# Patient Record
Sex: Female | Born: 1960 | Hispanic: Yes | Marital: Single | Smoking: Never smoker
Health system: Southern US, Community
[De-identification: ages and names within clinical notes are randomized; demographics above are authoritative.]

## PROBLEM LIST (undated history)

## (undated) ENCOUNTER — Emergency Department (HOSPITAL_COMMUNITY): Admission: EM | Discharge status: Absent without leave | Payer: Self-pay

---

## 2003-04-25 ENCOUNTER — Other Ambulatory Visit: Payer: Self-pay

## 2004-03-19 ENCOUNTER — Inpatient Hospital Stay: Payer: Self-pay | Admitting: Internal Medicine

## 2004-03-26 ENCOUNTER — Emergency Department: Payer: Self-pay | Admitting: Emergency Medicine

## 2005-08-05 ENCOUNTER — Ambulatory Visit: Payer: Self-pay | Admitting: Family Medicine

## 2006-05-07 ENCOUNTER — Ambulatory Visit: Payer: Self-pay

## 2007-12-21 ENCOUNTER — Ambulatory Visit: Payer: Self-pay | Admitting: Family Medicine

## 2007-12-28 ENCOUNTER — Ambulatory Visit: Payer: Self-pay | Admitting: Family Medicine

## 2011-02-01 ENCOUNTER — Emergency Department (HOSPITAL_COMMUNITY): Payer: Worker's Compensation

## 2011-02-01 ENCOUNTER — Emergency Department (HOSPITAL_COMMUNITY)
Admission: EM | Admit: 2011-02-01 | Discharge: 2011-02-01 | Disposition: A | Discharge status: Home / Self Care | Payer: Worker's Compensation | Attending: Emergency Medicine | Admitting: Emergency Medicine

## 2011-02-01 ENCOUNTER — Encounter: Payer: Self-pay | Admitting: *Deleted

## 2011-02-01 DIAGNOSIS — Y9269 Other specified industrial and construction area as the place of occurrence of the external cause: Secondary | ICD-10-CM | POA: Insufficient documentation

## 2011-02-01 DIAGNOSIS — T07XXXA Unspecified multiple injuries, initial encounter: Secondary | ICD-10-CM

## 2011-02-01 DIAGNOSIS — M25519 Pain in unspecified shoulder: Secondary | ICD-10-CM | POA: Insufficient documentation

## 2011-02-01 DIAGNOSIS — M549 Dorsalgia, unspecified: Secondary | ICD-10-CM | POA: Insufficient documentation

## 2011-02-01 DIAGNOSIS — W010XXA Fall on same level from slipping, tripping and stumbling without subsequent striking against object, initial encounter: Secondary | ICD-10-CM | POA: Insufficient documentation

## 2011-02-01 DIAGNOSIS — W19XXXA Unspecified fall, initial encounter: Secondary | ICD-10-CM

## 2011-02-01 DIAGNOSIS — S20229A Contusion of unspecified back wall of thorax, initial encounter: Secondary | ICD-10-CM | POA: Insufficient documentation

## 2011-02-01 DIAGNOSIS — S40019A Contusion of unspecified shoulder, initial encounter: Secondary | ICD-10-CM | POA: Insufficient documentation

## 2011-02-01 MED ORDER — METHOCARBAMOL 500 MG PO TABS: 500.0000 mg | ORAL_TABLET | Freq: Three times a day (TID) | ORAL | Status: AC

## 2011-02-01 MED ORDER — HYDROCODONE-ACETAMINOPHEN 5-325 MG PO TABS: ORAL_TABLET | ORAL | Status: AC

## 2011-02-01 MED ORDER — NAPROXEN 500 MG PO TABS: 500.0000 mg | ORAL_TABLET | Freq: Two times a day (BID) | ORAL | Status: DC

## 2011-02-01 NOTE — ED Provider Notes (Signed)
History     CSN: [id number] Arrival date & time: 02/01/2011  4:21 PM   First MD Initiated Contact with Patient 02/01/11 1624      Chief Complaint  Patient presents with  . Fall    (Consider location/radiation/quality/duration/timing/severity/associated sxs/prior treatment) HPI Comments: History was obtained through [employer].  Patient c/o pain to her right shoulder,and  right lower back.  States she slipped on a wet floor at work and landed on her right side.  She denies LOC, head injury , headaches , visual changes or neck pain.  Pain is worse with walking and certain movements and improves with rest.    Patient is a 50 y.o. female presenting with fall. The history is provided by the patient.  Fall The accident occurred 2 days ago. The fall occurred while walking. She fell from a height of 3 to 5 ft. She landed on a hard floor. There was no blood loss. Point of impact: right lower back and right shoulder. Pain location: right middle and lower back and right shoulder. The pain is moderate. She was ambulatory at the scene. There was no entrapment after the fall. There was no drug use involved in the accident. There was no alcohol use involved in the accident. Pertinent negatives include no visual change, no fever, no numbness, no abdominal pain, no bowel incontinence, no nausea, no vomiting, no hematuria, no headaches, no loss of consciousness and no tingling. The symptoms are aggravated by activity, extension, rotation and ambulation. She has tried nothing for the symptoms. The treatment provided no relief.    History reviewed. No pertinent past medical history.  History reviewed. No pertinent past surgical history.  History reviewed. No pertinent family history.  History  Substance Use Topics  . Smoking status: Never Smoker   . Smokeless tobacco: Not on file  . Alcohol Use: No    OB History    Grav Para Term Preterm Abortions TAB SAB Ect Mult Living                   Review of Systems  Constitutional: Negative for fever, chills, activity change, appetite change and fatigue.  HENT: Negative for sore throat, facial swelling, trouble swallowing, neck pain and neck stiffness.   Eyes: Negative for visual disturbance.  Respiratory: Positive for wheezing. Negative for cough, chest tightness and shortness of breath.   Cardiovascular: Negative for chest pain and palpitations.  Gastrointestinal: Negative for nausea, vomiting, abdominal pain and bowel incontinence.  Genitourinary: Negative for dysuria, hematuria, flank pain and difficulty urinating.  Musculoskeletal: Positive for back pain and arthralgias. Negative for myalgias, joint swelling and gait problem.  Skin: Negative.  Negative for rash and wound.  Neurological: Negative for dizziness, tingling, loss of consciousness, facial asymmetry, weakness, numbness and headaches.  Hematological: Does not bruise/bleed easily.  All other systems reviewed and are negative.    Allergies  Review of patient's allergies indicates no known allergies.  Home Medications  No current outpatient prescriptions on file.  BP 120/65  Pulse 88  Temp(Src) 98.4 F (36.9 C) (Oral)  Resp 20  SpO2 97%  Physical Exam  Nursing note and vitals reviewed. Constitutional: She is oriented to person, place, and time. She appears well-developed and well-nourished. No distress.  HENT:  Head: Normocephalic and atraumatic.  Mouth/Throat: Oropharynx is clear and moist.  Eyes: EOM are normal. Pupils are equal, round, and reactive to light.  Neck: Normal range of motion. Neck supple. No spinous process tenderness and no muscular  tenderness present. No rigidity. Normal range of motion present.  Cardiovascular: Normal rate, regular rhythm and normal heart sounds.   Pulmonary/Chest: Effort normal and breath sounds normal. No respiratory distress. She exhibits no tenderness.  Abdominal: Soft. She exhibits no distension and no mass. There  is no tenderness. There is no rebound and no guarding.  Musculoskeletal: Normal range of motion. She exhibits tenderness. She exhibits no edema.       Right shoulder: She exhibits tenderness and bony tenderness. She exhibits normal range of motion, no swelling, no effusion, no crepitus, no deformity, no laceration, no spasm, normal pulse and normal strength.       Back:       Arms: Lymphadenopathy:    She has no cervical adenopathy.  Neurological: She is alert and oriented to person, place, and time. No cranial nerve deficit or sensory deficit. She exhibits normal muscle tone. Coordination and gait normal.  Reflex Scores:      Tricep reflexes are 2+ on the right side and 2+ on the left side.      Bicep reflexes are 2+ on the right side and 2+ on the left side.      Patellar reflexes are 2+ on the right side and 2+ on the left side.      Achilles reflexes are 2+ on the right side and 2+ on the left side. Skin: Skin is warm and dry.  Psychiatric: She has a normal mood and affect.    ED Course  Procedures (including critical care time)  Labs Reviewed - No data to display Dg Ribs Unilateral W/chest Right  02/01/2011  *RADIOLOGY REPORT*  Clinical Data: Fall with right rib pain.  RIGHT RIBS AND CHEST - 3+ VIEW  Comparison: None.  Findings: Trachea is midline.  Heart size normal.  Lungs are clear. No pleural fluid.  No pneumothorax.  Dedicated views of the right ribs show no definite acute fracture.  IMPRESSION: No acute findings.  Original Report Authenticated By: Reyes Ivan, M.D.   Dg Lumbar Spine Complete  02/01/2011  *RADIOLOGY REPORT*  Clinical Data: Fall with low back pain.  LUMBAR SPINE - COMPLETE 4+ VIEW  Comparison: None.  Findings: Alignment is anatomic.  Vertebral body height is maintained.  No pars defects.  Mild endplate degenerative changes are seen in the lower lumbar spine, with loss of disc space height and facet hypertrophy at L5-S1.  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  No acute findings.  Original Report Authenticated By: Reyes Ivan, M.D.   Dg Shoulder Right  02/01/2011  *RADIOLOGY REPORT*  Clinical Data: Fall with right shoulder pain.  RIGHT SHOULDER - 2+ VIEW  Comparison: None.  Findings: No fracture or dislocation.  Mild degenerative changes are seen in the right acromioclavicular joint.  There is an age indeterminate nondisplaced fracture involving the right 8th posterolateral rib.  Visualized portion of the right chest is unremarkable.  IMPRESSION:  1.  No fracture or dislocation involving the right shoulder. 2. Age indeterminate nondisplaced fracture of the right 8th posterolateral rib.  Original Report Authenticated By: Reyes Ivan, M.D.        MDM      6:47 PM patient is sitting on the stretcher, NAD.  Vitals stable.  Ambulates w/o difficulty, no focal neuro deficits.  ttp of the right lumbar and thoracic paraspinal muscles, no brusing or edema.  Pt has full ROM of all extremites. Will prescribes pain meds, NSAIDs and pt agrees to f/u with her PMD.  Patient / Family / Caregiver understand and agree with initial ED impression and plan with expectations set for ED visit.    Tanajah Boulter L. Shania Bjelland, [place] 02/02/11 0105

## 2011-02-01 NOTE — ED Notes (Signed)
Pt c/o fall x 2 days ago; pt c/o pain to her right side and right lower back;

## 2011-02-01 NOTE — ED Notes (Signed)
Patient with no complaints at this time. Respirations even and unlabored. Skin warm/dry. Discharge instructions reviewed with patient at this time. Patient given opportunity to voice concerns/ask questions. Patient discharged at this time and left Emergency Department with steady gait.   

## 2011-02-01 NOTE — Discharge Instructions (Signed)
Contusin (Contusion) Furniture conservator/restorer (contusin) es una acumulacin de sangre debajo de la piel que produce un cambio en la coloracin. Se origina en una lesin de los vasos sanguneos que se encuentran debajo de la zona [place], que produce una liberacin de [place]. A esta acumulacin de sangre se la conoce como hematoma. Produce una coloracin entre [place] y [place] y, a medida que la lesin se Sport and exercise psychologist (mejora) luego de unos [address]  [address] o [place], se torna de un color amarillento y luego generalmente desaparece completamente en el mismo lapso. Generalmente se cura completamente sin dejar secuelas. El hematoma rara vez requiere drenaje. INSTRUCCIONES PARA EL CUIDADO DOMICILIARIO  Aplique hielo sobre la lesin durante 15 a 20 minutos 3 a 4 veces por da durante uno o [address].   Ponga el hielo en una bolsa plstica y coloque una toalla entre la bolsa y la piel. Suspenda el uso de hielo si Art gallery manager.   Si el sangrado es mnimo, aplique presin en el rea durante al menos treinta minutos para disminuir el hematoma o aplique presin y hielo como le indic el profesional que lo asiste.   Si la herida se produjo en una extremidad, eleve esa zona para disminuir el dolor y la hinchazn. Envolverlo con un vendaje ACE o un vendaje de sostn puede beneficiarlo. Si el hematoma se produjo en una extremidad inferior y es dolorosa, puede aliviarlo el uso de muletas durante [address]   Si le han aplicado la vacuna contra el ttanos porque hubo lesin de piel, el brazo podr hincharse, enrojecer o subir la temperatura en el lugar de la inyeccin. Esta es una respuesta normal a Industrial/product designer. Si usted no ha recibido Publishing copy ttano porque no recuerda cuando recibi la ltima, asegrese de controlarse con el profesional que lo asiste para determinar si la necesita. Generalmente para una herida "sucia" deber aplicarse una dosis de refuerzo si no se la ha [employer] ltimos cinco aos.  Generalmente para una herida "limpia" deber aplicarse una dosis de refuerzo si no se la ha [employer] ltimos [employer]  SOLICITE ATENCIN MDICA SI:  Siente dolor que no Magazine features editor con los medicamentos de [employer]. Utilice los medicamentos de venta libre o de prescripcin para Chief Technology Officer, Environmental health practitioner o la [place], segn se lo indique el profesional que lo asiste. No utilice aspirinas, ya que Building surveyor.   Aparece un dolor cada vez ms intenso o hinchazn en la zona de la lesin.   Aparece algn problema que parece ser peor que el trastorno que lo trajo a la [place].  SOLICITE ATENCIN MDICA DE INMEDIATO SI:  Tiene fiebre.   Comienza a sentir dolor intenso en la zona del hematoma que no se corresponde con la lesin inicial.   La zona del hematoma se vuelve roja, se hincha o duele.  EST SEGURO QUE:   Comprende las instrucciones para el alta mdica.   Controlar su enfermedad.   Solicitar atencin mdica de inmediato segn las indicaciones.  Document Released: 12/05/2004 Document Revised: 11/07/2010 [facility] Patient Information 2012 [place], [place].Contusion A contusion is a deep bruise. Bruises happen when an injury causes bleeding under the skin. Signs of bruising include pain, puffiness (swelling), and discolored skin. The bruise may turn blue, purple, or yellow. HOME CARE   Rest the injured area until the pain and puffiness are better.   Try to limit use of the injured area as much as possible or as told by  your doctor.   Put ice on the injured area.   Put ice in a plastic bag.   Place a towel between your skin and the bag.   Leave the ice on for 15 to 20 minutes, 3 to 4 times a day.   Raise (elevate) the injured area above the level of the heart.   Use an elastic bandage to lessen puffiness and motion.   Only take medicine as told by your doctor.   Eat healthy.   See your doctor for a follow-up visit.  GET HELP RIGHT AWAY IF:    There is more redness, puffiness, or pain.   You have a headache, muscle ache, or you feel dizzy and ill.   You have a fever.   The pain is not controlled with medicine.   The bruise is not getting better.   There is yellowish white fluid (pus) coming from the wound.   You lose feeling (numbness) in the injured area.   The bruised area feels cold.   There are new problems.  MAKE SURE YOU:   Understand these instructions.   Will watch your condition.   Will get help right away if you are not doing well or get worse.  Document Released: 08/14/2007 Document Revised: 11/07/2010 Document Reviewed: 08/14/2007 [facility] Patient Information 2012 [place], [place].

## 2011-02-02 NOTE — ED Provider Notes (Signed)
Medical screening examination/treatment/procedure(s) were performed by non-physician practitioner and as supervising physician I was immediately available for consultation/collaboration.   Laray Anger, DO 02/02/11 2217

## 2011-10-07 ENCOUNTER — Emergency Department (HOSPITAL_COMMUNITY): Payer: Self-pay

## 2011-10-07 ENCOUNTER — Emergency Department (HOSPITAL_COMMUNITY)
Admission: EM | Admit: 2011-10-07 | Discharge: 2011-10-07 | Disposition: A | Discharge status: Home / Self Care | Payer: Self-pay | Attending: Emergency Medicine | Admitting: Emergency Medicine

## 2011-10-07 ENCOUNTER — Encounter (HOSPITAL_COMMUNITY): Payer: Self-pay | Admitting: *Deleted

## 2011-10-07 DIAGNOSIS — M549 Dorsalgia, unspecified: Secondary | ICD-10-CM | POA: Insufficient documentation

## 2011-10-07 DIAGNOSIS — R109 Unspecified abdominal pain: Secondary | ICD-10-CM | POA: Insufficient documentation

## 2011-10-07 DIAGNOSIS — M65839 Other synovitis and tenosynovitis, unspecified forearm: Secondary | ICD-10-CM | POA: Insufficient documentation

## 2011-10-07 DIAGNOSIS — M778 Other enthesopathies, not elsewhere classified: Secondary | ICD-10-CM

## 2011-10-07 MED ORDER — OXYCODONE-ACETAMINOPHEN 5-325 MG PO TABS: 1.0000 | ORAL_TABLET | Freq: Four times a day (QID) | ORAL | Status: AC | PRN

## 2011-10-07 MED ORDER — IBUPROFEN 600 MG PO TABS: 600.0000 mg | ORAL_TABLET | Freq: Four times a day (QID) | ORAL | Status: AC | PRN

## 2011-10-07 NOTE — ED Notes (Signed)
Return from xray

## 2011-10-07 NOTE — ED Provider Notes (Signed)
History  This chart was scribed for [employer]. Rubin Payor, MD by Bennett Scrape. This patient was seen in room APA18/APA18 and the patient's care was started at 11:10AM.  CSN: [id number]  Arrival date & time 10/07/11  1029   First MD Initiated Contact with Patient 10/07/11 1110      Chief Complaint  Patient presents with  . Flank Pain  . Back Pain    The history is provided by the patient. A language interpreter was used.    [place] Janice Wells is a 51 y.o. female who presents to the Emergency Department complaining of one week of gradual onset, gradually worsening, constant mid back pain that radiates into the left anterior ribs that is worse with movement. She reports experiencing a similar pain 2 years ago after a fall. She denies any recent falls. She denies taking OTC medications at home to improve symptoms. She also c/o several weeks of gradually worsening, constant right wrist pain localized mainly in the thumb that she attributes to a plate that she uses at work. The pain is worse with movement and use of the right hand. She denies fever, nausea, emesis, urinary symptoms, numbness and weakness as associated symptoms. She does not have a h/o chronic medical conditions. She denies smoking and alcohol use.   History reviewed. No pertinent past medical history.  History reviewed. No pertinent past surgical history.  No family history on file.  History  Substance Use Topics  . Smoking status: Never Smoker   . Smokeless tobacco: Not on file  . Alcohol Use: No    No OB history provided.  Review of Systems  Constitutional: Negative for appetite change.  Cardiovascular: Positive for chest pain (left rib pain). Negative for leg swelling.  Gastrointestinal: Positive for abdominal pain. Negative for nausea, vomiting and diarrhea.  Genitourinary: Negative for dysuria, hematuria and flank pain.  Musculoskeletal: Positive for back pain (mid).  Neurological: Negative for  weakness and numbness.    Allergies  Review of patient's allergies indicates no known allergies.  Home Medications   Current Outpatient Rx  Name Route Sig Dispense Refill  . IBUPROFEN 600 MG PO TABS Oral Take 1 tablet (600 mg total) by mouth every 6 (six) hours as needed for pain. 20 tablet 0  . OXYCODONE-ACETAMINOPHEN 5-325 MG PO TABS Oral Take 1-2 tablets by mouth every 6 (six) hours as needed for pain. 10 tablet 0    Triage Vitals: BP 131/68  Pulse 83  Temp 98.5 F (36.9 C) (Oral)  Resp 16  Ht 5\' 1"  (1.549 m)  Wt 150 lb (68.04 kg)  BMI 28.34 kg/m2  SpO2 99%  Physical Exam  Nursing note and vitals reviewed. Constitutional: She is oriented to person, place, and time. She appears well-developed and well-nourished. No distress.  HENT:  Head: Normocephalic and atraumatic.  Eyes: EOM are normal.  Neck: Neck supple. No tracheal deviation present.  Cardiovascular: Normal rate and regular rhythm.   Pulmonary/Chest: Effort normal and breath sounds normal. No respiratory distress. She exhibits tenderness (tender over the left anterior ribs).  Abdominal: Soft. There is no tenderness.  Musculoskeletal: Normal range of motion. She exhibits tenderness. She exhibits no edema.       Tender over the right wrist laterally with tenderness to opposition of the thumb, no snuff box tenderness, tender along the lower thoracic spine  Neurological: She is alert and oriented to person, place, and time.  Skin: Skin is warm and dry.  Psychiatric: She has a normal mood and  affect. Her behavior is normal.    ED Course  Procedures (including critical care time)  COORDINATION OF CARE: 11:32AM-Through interpreter, discussed treatment plan which includes x-rays, pain medications and a splint with pt at bedside and pt agreed to plan. 12:49PM-Informed pt of radiology results. Discussed discharge plan of splint and pain medications with pt and pt agreed.   Labs Reviewed - No data to display Dg Ribs  Unilateral W/chest Left  10/07/2011  *RADIOLOGY REPORT*  Clinical Data: Flank pain, back pain, left rib pain.  LEFT RIBS AND CHEST - 3+ VIEW  Comparison: 02/01/2011  Findings: Minimal bibasilar atelectasis.  Heart is normal size.  No effusions.  No acute bony abnormality.  No evidence of left rib fracture or rib lesion.  No pneumothorax.  IMPRESSION: Bibasilar atelectasis.  Original Report Authenticated By: Cyndie Chime, M.D.   Dg Wrist Complete Right  10/07/2011  *RADIOLOGY REPORT*  Clinical Data: Right wrist pain  RIGHT WRIST - COMPLETE 3+ VIEW  Comparison: None.  Findings: No fracture or dislocation is seen.  The joint spaces are essentially preserved.  The visualized soft tissues are unremarkable.  IMPRESSION: No fracture or dislocation is seen.  Original Report Authenticated By: Charline Bills, M.D.     1. Back pain   2. Tendonitis of wrist, right       MDM  Patient with left back pain worse with movement. She has had episodes of this since a fall 2 years ago. No rash. No dysuria. Is worse with movement or palpation. Negative rib x-ray. Doubt urinary pathology. Also right wrist done due to tenderness. Negative x-ray. Patient will followup with Dr. Hilda Lias, with whom I discussed the case  I personally performed the services described in this documentation, which was scribed in my presence. The recorded information has been reviewed and considered.          Juliet Rude. Rubin Payor, MD 10/07/11 1322

## 2011-10-07 NOTE — ED Notes (Signed)
Pt states mid back pain and left flank pain began 1 weeks ago. NAD.

## 2014-04-27 ENCOUNTER — Other Ambulatory Visit (HOSPITAL_COMMUNITY): Payer: Self-pay | Admitting: Physician Assistant

## 2014-04-27 DIAGNOSIS — Z1231 Encounter for screening mammogram for malignant neoplasm of breast: Secondary | ICD-10-CM

## 2014-05-11 ENCOUNTER — Ambulatory Visit (HOSPITAL_COMMUNITY)
Admission: RE | Admit: 2014-05-11 | Discharge: 2014-05-11 | Disposition: A | Discharge status: Home / Self Care | Payer: Self-pay | Source: Ambulatory Visit | Attending: Physician Assistant | Admitting: Physician Assistant

## 2014-05-11 DIAGNOSIS — Z1231 Encounter for screening mammogram for malignant neoplasm of breast: Secondary | ICD-10-CM

## 2014-12-13 ENCOUNTER — Ambulatory Visit: Payer: Self-pay | Admitting: Physician Assistant

## 2014-12-13 ENCOUNTER — Encounter: Payer: Self-pay | Admitting: Physician Assistant

## 2014-12-13 VITALS — BP 124/70 | HR 77 | Temp 97.7°F | Ht <= 58 in | Wt 151.0 lb

## 2014-12-13 DIAGNOSIS — M25561 Pain in right knee: Secondary | ICD-10-CM

## 2014-12-13 NOTE — Progress Notes (Signed)
   Subjective:    Patient ID: Janice Wells, female    DOB: 1960-09-03, 54 y.o.   MRN: [id number]  HPI  Knee Pain  Pt slipped in the bathroom and hit her R knee on the bathtub on Saturday 11/29/14. Pt fell again on Saturday prior to today and scraped her R shin    Review of Systems  Constitutional: Negative for activity change.  Musculoskeletal: Negative for joint swelling and gait problem.  Skin: Positive for wound. Negative for color change.  Neurological: Negative for weakness and numbness.       Objective:   Physical Exam  Constitutional: She is oriented to person, place, and time. She appears well-developed and well-nourished.  Pulmonary/Chest: Effort normal.  Musculoskeletal: Normal range of motion. She exhibits no edema or tenderness.       Right knee: She exhibits normal range of motion, no swelling, no effusion, no ecchymosis, no deformity, no erythema, normal alignment, no LCL laxity and normal patellar mobility. No tenderness found.  Neurological: She is alert and oriented to person, place, and time.  Skin: Skin is warm and dry. Abrasion (R shin- healing well without s/s infection) noted.  Vitals reviewed.         Assessment & Plan:   Knee pain, acute, right  rec ice, elevate. Can wear ace wrap or knee sleeve if hurting. Avoid overexertion for several days until completely well. ibu or apap prn

## 2015-01-03 ENCOUNTER — Ambulatory Visit: Payer: Self-pay | Admitting: Physician Assistant

## 2015-01-03 ENCOUNTER — Encounter: Payer: Self-pay | Admitting: Physician Assistant

## 2015-01-03 VITALS — BP 138/72 | HR 69 | Temp 97.2°F | Ht 58.75 in | Wt 152.2 lb

## 2015-01-03 DIAGNOSIS — R1030 Lower abdominal pain, unspecified: Secondary | ICD-10-CM

## 2015-01-03 DIAGNOSIS — R1031 Right lower quadrant pain: Secondary | ICD-10-CM

## 2015-01-03 LAB — POCT URINALYSIS DIPSTICK
Bilirubin, UA: NEGATIVE
Glucose, UA: NEGATIVE
Ketones, UA: NEGATIVE
Leukocytes, UA: NEGATIVE
Nitrite, UA: NEGATIVE
PROTEIN UA: NEGATIVE
RBC UA: NEGATIVE
SPEC GRAV UA: 1.01
UROBILINOGEN UA: 0.2
pH, UA: 5

## 2015-01-03 NOTE — Patient Instructions (Signed)
Dieta rica en fibra  (High-Fiber Diet)  La fibra, también llamada fibra dietaria, es un tipo de carbohidrato que se encuentra en las frutas, las verduras, los cereales integrales y los frijoles. Una dieta rica en fibra puede tener muchos beneficios para la salud. El médico puede recomendar una dieta rica en fibra para ayudar a:  · Evitar el estreñimiento. La fibra puede hacer que defeque con más frecuencia.  · Disminuir el nivel de colesterol.  · Aliviar las hemorroides, la diverticulosis no complicada o el síndrome del intestino irritable.  · Evitar comer en exceso como parte de un plan para bajar de peso.  · Evitar cardiopatías, la diabetes tipo 2 y ciertos cánceres.  ¿EN QUÉ CONSISTE EL PLAN?  El consumo diario recomendado de fibra incluye lo siguiente:  · 38 gramos para hombres menores de 50 años.  · 30 gramos para hombres mayores de 50 años.  · 25 gramos para mujeres menores de 50 años.  · 21 gramos para mujeres mayores de 50 años.  Puede lograr el consumo diario recomendado de fibra si come una variedad de frutas, verduras, cereales y frijoles. El médico también puede recomendar un suplemento de fibra si no es posible obtener suficiente fibra a través de la dieta.  ¿QUÉ DEBO SABER ACERCA DE LA DIETA RICA EN FIBRA?  · La eficacia de los suplementos de fibra no ha sido estudiada ampliamente, de modo que es mejor obtener fibra a través de los alimentos.  · Verifique siempre el contenido de fibra en la etiqueta de información nutricional de los alimentos preenvasados. Busque alimentos que contengan al menos 5 gramos de fibra por porción.  · Consulte al nutricionista si tiene preguntas sobre algunos alimentos específicos relacionados con su enfermedad, especialmente si estos alimentos no se mencionan a continuación.  · Aumente el consumo diario de fibra en forma gradual. Aumentar demasiado rápido el consumo de fibra dietaria puede provocar meteorismo, cólicos o gases.  · Beber abundante agua. El agua ayuda a  digerir la fibra.  ¿QUÉ ALIMENTOS PUEDO COMER?  Cereales  Panes integrales. Multicereales. Avena. Arroz integral. Cebada. Trigo burgol. Mijo. Muffins de salvado. Palomitas de maíz. Galletas de centeno.  Verduras  Batatas. Espinaca. Col rizada. Alcachofas. Repollo. Brócoli. Guisantes. Zanahorias. [place].  Frutas  Frutos rojos. Peras. Manzanas. Naranjas [place]. Ciruelas y pasas. Higos secos.  Carnes y otras fuentes de proteínas  Frijoles blancos, colorados, pintos y porotos de soja. Guisantes secos. Lentejas. Frutos secos y semillas.  Lácteos  Yogur fortificado con fibra.  Bebidas  Leche de soja fortificada con fibra. Jugo de naranja fortificado con fibra.  Otros  Barras de fibra.  Los artículos mencionados arriba pueden no ser una lista completa de las bebidas o los alimentos recomendados. Comuníquese con el nutricionista para conocer más opciones.  ¿QUÉ ALIMENTOS NO SE RECOMIENDAN?  Cereales  Pan blanco. Pastas hechas con harina refinada. Arroz blanco.  Verduras  Papas fritas. Verduras enlatadas. Verduras bien cocidas.   Frutas  Jugo de frutas. Frutas cocidas coladas.  Carnes y otras fuentes de proteínas  Cortes de carne con grasa. Aves o pescados fritos.  Lácteos  Leche. Yogur. Queso crema. Crema ácida.  Bebidas  Gaseosas.  Otros  Tortas y pasteles. Mantequilla y aceites.  Los artículos mencionados arriba pueden no ser una lista completa de las bebidas y los alimentos que se deben evitar. Comuníquese con el nutricionista para obtener más información.  ALGUNOS CONSEJOS PARA INCLUIR ALIMENTOS RICOS EN FIBRA EN LA DIETA  · Consuma una gran variedad de alimentos ricos en fibra.  ·   Asegúrese de que la mitad de todos los cereales consumidos cada día sean cereales integrales.  · Reemplace los panes y cereales hechos de harina refinada o harina blanca por panes y cereales integrales.  · Reemplace el arroz blanco por arroz integral, trigo burgol o mijo.  · Comience el día con un desayuno rico en fibra, como un cereal  que contenga al menos 5 gramos de fibra por porción.  · Use guisantes en lugar de carne en las sopas, ensaladas o pastas.  · Coma bocadillos ricos en fibra, como frutos rojos, verduras crudas, frutos secos o palomitas de maíz.     Esta información no tiene como fin reemplazar el consejo del médico. Asegúrese de hacerle al médico cualquier pregunta que tenga.     Document Released: 02/25/2005 Document Revised: 03/18/2014  Elsevier Interactive Patient Education ©2016 [employer]

## 2015-01-03 NOTE — Progress Notes (Signed)
BP 138/72 mmHg  Pulse 69  Temp(Src) 97.2 F (36.2 C)  Ht 4' 10.75" (1.492 m)  Wt 152 lb 3.2 oz (69.037 kg)  BMI 31.01 kg/m2  SpO2 99%   Subjective:    Patient ID: Janice Wells, female    DOB: pe10/24/1962, 54 y.o.   MRN: [id number]  HPI: [place] Janice Wells is a 54 y.o. female presenting on 01/03/2015 for Abdominal Pain  Chief Complaint  Patient presents with  . Abdominal Pain    pt c/o of R lower abd pain pt states pain come all of a sudden, pt states she feels the pain is relieved a little when she has a BM or urinates, but does not go away completely. pt states pain last about about one hour everyday. pt has not taken anything for pain.   NO ABD PAIN TODAY!  Abdominal Pain The current episode started more than 1 month ago (pnset 2 mo ago). The onset quality is sudden. The problem occurs daily. The most recent episode lasted 1 hour. The problem has been unchanged. The pain is located in the RLQ. The pain is at a severity of 7/10. Pertinent negatives include no arthralgias, constipation, diarrhea, dysuria, fever, headaches, hematuria or vomiting. Nothing aggravates the pain. The pain is relieved by urination and bowel movements. She has tried nothing for the symptoms. There is no history of abdominal surgery, gallstones, GERD or PUD.   pt states BMs twice daily but jus a small amount. Pt had negative iFOBT 04/27/14   Relevant past medical, surgical, family and social history reviewed and updated as indicated. Interim medical history since our last visit reviewed. Allergies and medications reviewed and updated.  No current outpatient prescriptions on file.   Review of Systems  Constitutional: Negative for fever, chills, diaphoresis, appetite change, fatigue and unexpected weight change.  HENT: Negative for congestion, dental problem, drooling, ear pain, facial swelling, hearing loss, mouth sores, sneezing, sore throat, trouble swallowing and voice change.    Eyes: Negative for pain, discharge, redness, itching and visual disturbance.  Respiratory: Negative for cough, choking, shortness of breath and wheezing.   Cardiovascular: Negative for chest pain, palpitations and leg swelling.  Gastrointestinal: Positive for abdominal pain. Negative for vomiting, diarrhea, constipation and blood in stool.  Endocrine: Negative for cold intolerance, heat intolerance and polydipsia.  Genitourinary: Negative for dysuria, hematuria and decreased urine volume.  Musculoskeletal: Negative for back pain, arthralgias and gait problem.  Skin: Negative for rash.  Allergic/Immunologic: Negative for environmental allergies.  Neurological: Negative for seizures, syncope, light-headedness and headaches.  Hematological: Negative for adenopathy.  Psychiatric/Behavioral: Negative for suicidal ideas, dysphoric mood and agitation. The patient is not nervous/anxious.     Per HPI unless specifically indicated above     Objective:    BP 138/72 mmHg  Pulse 69  Temp(Src) 97.2 F (36.2 C)  Ht 4' 10.75" (1.492 m)  Wt 152 lb 3.2 oz (69.037 kg)  BMI 31.01 kg/m2  SpO2 99%  Wt Readings from Last 3 Encounters:  01/03/15 152 lb 3.2 oz (69.037 kg)  12/13/14 151 lb (68.493 kg)    Physical Exam  Constitutional: She is oriented to person, place, and time. She appears well-developed and well-nourished.  HENT:  Head: Normocephalic and atraumatic.  Neck: Neck supple.  Cardiovascular: Normal rate and regular rhythm.   Pulmonary/Chest: Effort normal and breath sounds normal.  Abdominal: Soft. Bowel sounds are normal. She exhibits no distension and no mass. There is tenderness (mild RLQ tenderness). There is  no rebound and no guarding.  Musculoskeletal: She exhibits no edema.  Lymphadenopathy:    She has no cervical adenopathy.  Neurological: She is alert and oriented to person, place, and time.  Skin: Skin is warm and dry.  Psychiatric: She has a normal mood and affect. Her  behavior is normal.  Vitals reviewed.   No results found for this or any previous visit.    Assessment & Plan:    Encounter Diagnosis  Name Primary?  . Right lower quadrant abdominal pain Yes      Pt would like to go forward with obtaining US Will give [facility] discount application Will increase fiber in diet to try to help in the interim F/u 6 wk. rto sooner prn

## 2015-01-11 ENCOUNTER — Telehealth: Payer: Self-pay | Admitting: Student

## 2015-01-11 NOTE — Telephone Encounter (Signed)
Called and left voicemail for pt to cal back. Call is in reference to US appt on Monday. Nov. 7, 2016 at 1100

## 2015-01-11 NOTE — Telephone Encounter (Signed)
-----   Message from Baldwin JamaicaBerenice Villegas, LPN sent at 16/1/096011/04/2014  5:16 PM EDT ----- Call to notify pt of Pelvic ultrasound on Mon. Jan 16, 2015 at 11:00. Pt is to have a full bladder so must drink at least 32 oz 1 hour before appt.

## 2015-01-11 NOTE — Telephone Encounter (Signed)
Pt returned my call and pt was notified of US appt. Pt understood.

## 2015-01-12 ENCOUNTER — Other Ambulatory Visit: Payer: Self-pay | Admitting: Physician Assistant

## 2015-01-12 DIAGNOSIS — R1031 Right lower quadrant pain: Secondary | ICD-10-CM

## 2015-01-16 ENCOUNTER — Ambulatory Visit (HOSPITAL_COMMUNITY)
Admission: RE | Admit: 2015-01-16 | Discharge: 2015-01-16 | Disposition: A | Discharge status: Home / Self Care | Payer: Self-pay | Source: Ambulatory Visit | Attending: Physician Assistant | Admitting: Physician Assistant

## 2015-01-16 DIAGNOSIS — R1031 Right lower quadrant pain: Secondary | ICD-10-CM | POA: Insufficient documentation

## 2015-02-14 ENCOUNTER — Ambulatory Visit: Payer: Self-pay | Admitting: Physician Assistant

## 2015-02-15 ENCOUNTER — Ambulatory Visit: Payer: Self-pay | Admitting: Physician Assistant

## 2015-02-15 ENCOUNTER — Encounter (HOSPITAL_COMMUNITY): Payer: Self-pay | Admitting: *Deleted

## 2015-02-15 ENCOUNTER — Encounter: Payer: Self-pay | Admitting: Physician Assistant

## 2015-02-15 VITALS — BP 116/66 | HR 66 | Temp 97.5°F | Ht 58.5 in | Wt 153.2 lb

## 2015-02-15 DIAGNOSIS — E669 Obesity, unspecified: Secondary | ICD-10-CM | POA: Insufficient documentation

## 2015-02-15 DIAGNOSIS — R109 Unspecified abdominal pain: Secondary | ICD-10-CM

## 2015-02-15 HISTORY — DX: Obesity, unspecified: E66.9

## 2015-02-15 NOTE — Patient Instructions (Signed)
Dieta rica en fibra  (High-Fiber Diet)  La fibra, también llamada fibra dietaria, es un tipo de carbohidrato que se encuentra en las frutas, las verduras, los cereales integrales y los frijoles. Una dieta rica en fibra puede tener muchos beneficios para la salud. El médico puede recomendar una dieta rica en fibra para ayudar a:  · Evitar el estreñimiento. La fibra puede hacer que defeque con más frecuencia.  · Disminuir el nivel de colesterol.  · Aliviar las hemorroides, la diverticulosis no complicada o el síndrome del intestino irritable.  · Evitar comer en exceso como parte de un plan para bajar de peso.  · Evitar cardiopatías, la diabetes tipo 2 y ciertos cánceres.  ¿EN QUÉ CONSISTE EL PLAN?  El consumo diario recomendado de fibra incluye lo siguiente:  · 38 gramos para hombres menores de 50 años.  · 30 gramos para hombres mayores de 50 años.  · 25 gramos para mujeres menores de 50 años.  · 21 gramos para mujeres mayores de 50 años.  Puede lograr el consumo diario recomendado de fibra si come una variedad de frutas, verduras, cereales y frijoles. El médico también puede recomendar un suplemento de fibra si no es posible obtener suficiente fibra a través de la dieta.  ¿QUÉ DEBO SABER ACERCA DE LA DIETA RICA EN FIBRA?  · La eficacia de los suplementos de fibra no ha sido estudiada ampliamente, de modo que es mejor obtener fibra a través de los alimentos.  · Verifique siempre el contenido de fibra en la etiqueta de información nutricional de los alimentos preenvasados. Busque alimentos que contengan al menos 5 gramos de fibra por porción.  · Consulte al nutricionista si tiene preguntas sobre algunos alimentos específicos relacionados con su enfermedad, especialmente si estos alimentos no se mencionan a continuación.  · Aumente el consumo diario de fibra en forma gradual. Aumentar demasiado rápido el consumo de fibra dietaria puede provocar meteorismo, cólicos o gases.  · Beber abundante agua. El agua ayuda a  digerir la fibra.  ¿QUÉ ALIMENTOS PUEDO COMER?  Cereales  Panes integrales. Multicereales. Avena. Arroz integral. Cebada. Trigo burgol. Mijo. Muffins de salvado. Palomitas de maíz. Galletas de centeno.  Verduras  Batatas. Espinaca. Col rizada. Alcachofas. Repollo. Brócoli. Guisantes. Zanahorias. [place].  Frutas  Frutos rojos. Peras. Manzanas. Naranjas [place]. Ciruelas y pasas. Higos secos.  Carnes y otras fuentes de proteínas  Frijoles blancos, colorados, pintos y porotos de soja. Guisantes secos. Lentejas. Frutos secos y semillas.  Lácteos  Yogur fortificado con fibra.  Bebidas  Leche de soja fortificada con fibra. Jugo de naranja fortificado con fibra.  Otros  Barras de fibra.  Los artículos mencionados arriba pueden no ser una lista completa de las bebidas o los alimentos recomendados. Comuníquese con el nutricionista para conocer más opciones.  ¿QUÉ ALIMENTOS NO SE RECOMIENDAN?  Cereales  Pan blanco. Pastas hechas con harina refinada. Arroz blanco.  Verduras  Papas fritas. Verduras enlatadas. Verduras bien cocidas.   Frutas  Jugo de frutas. Frutas cocidas coladas.  Carnes y otras fuentes de proteínas  Cortes de carne con grasa. Aves o pescados fritos.  Lácteos  Leche. Yogur. Queso crema. Crema ácida.  Bebidas  Gaseosas.  Otros  Tortas y pasteles. Mantequilla y aceites.  Los artículos mencionados arriba pueden no ser una lista completa de las bebidas y los alimentos que se deben evitar. Comuníquese con el nutricionista para obtener más información.  ALGUNOS CONSEJOS PARA INCLUIR ALIMENTOS RICOS EN FIBRA EN LA DIETA  · Consuma una gran variedad de alimentos ricos en fibra.  ·   Asegúrese de que la mitad de todos los cereales consumidos cada día sean cereales integrales.  · Reemplace los panes y cereales hechos de harina refinada o harina blanca por panes y cereales integrales.  · Reemplace el arroz blanco por arroz integral, trigo burgol o mijo.  · Comience el día con un desayuno rico en fibra, como un cereal  que contenga al menos 5 gramos de fibra por porción.  · Use guisantes en lugar de carne en las sopas, ensaladas o pastas.  · Coma bocadillos ricos en fibra, como frutos rojos, verduras crudas, frutos secos o palomitas de maíz.     Esta información no tiene como fin reemplazar el consejo del médico. Asegúrese de hacerle al médico cualquier pregunta que tenga.     Document Released: 02/25/2005 Document Revised: 03/18/2014  Elsevier Interactive Patient Education ©2016 [employer]

## 2015-02-15 NOTE — Progress Notes (Signed)
   BP 116/66 mmHg  Pulse 66  Temp(Src) 97.5 F (36.4 C)  Ht 4' 10.5" (1.486 m)  Wt 153 lb 3.2 oz (69.491 kg)  BMI 31.47 kg/m2  SpO2 97%   Subjective:    Patient ID: Janice Wells, female    DOB: 1960-04-05, 54 y.o.   MRN: [id number]  HPI: [place] Janice Wells is a 54 y.o. female presenting on 02/15/2015 for Abdominal Pain   HPI  Pt states that she is getting the pain less often. Now only maybe 2 days/week.  Pain lasts 30min- 1 hour  Pt says she is no longer having constipation   Relevant past medical, surgical, family and social history reviewed and updated as indicated. Interim medical history since our last visit reviewed. Allergies and medications reviewed and updated.  No current outpatient prescriptions on file.   Review of Systems  Constitutional: Negative for fever, chills, diaphoresis, appetite change, fatigue and unexpected weight change.  HENT: Negative for congestion, dental problem, drooling, ear pain, facial swelling, hearing loss, mouth sores, sneezing, sore throat, trouble swallowing and voice change.   Eyes: Negative for pain, discharge, redness, itching and visual disturbance.  Respiratory: Negative for cough, choking, shortness of breath and wheezing.   Cardiovascular: Negative for chest pain, palpitations and leg swelling.  Gastrointestinal: Negative for vomiting, abdominal pain, diarrhea, constipation and blood in stool.  Endocrine: Negative for cold intolerance, heat intolerance and polydipsia.  Genitourinary: Negative for dysuria, hematuria and decreased urine volume.  Musculoskeletal: Negative for back pain, arthralgias and gait problem.  Skin: Negative for rash.  Allergic/Immunologic: Negative for environmental allergies.  Neurological: Negative for seizures, syncope, light-headedness and headaches.  Hematological: Negative for adenopathy.  Psychiatric/Behavioral: Negative for suicidal ideas, dysphoric mood and agitation. The patient  is not nervous/anxious.     Per HPI unless specifically indicated above     Objective:    BP 116/66 mmHg  Pulse 66  Temp(Src) 97.5 F (36.4 C)  Ht 4' 10.5" (1.486 m)  Wt 153 lb 3.2 oz (69.491 kg)  BMI 31.47 kg/m2  SpO2 97%  Wt Readings from Last 3 Encounters:  02/15/15 153 lb 3.2 oz (69.491 kg)  01/03/15 152 lb 3.2 oz (69.037 kg)  12/13/14 151 lb (68.493 kg)    Physical Exam  Constitutional: She is oriented to person, place, and time. She appears well-developed and well-nourished.  HENT:  Head: Normocephalic and atraumatic.  Neck: Neck supple.  Cardiovascular: Normal rate and regular rhythm.   Pulmonary/Chest: Effort normal and breath sounds normal.  Abdominal: Soft. Bowel sounds are normal. She exhibits no mass. There is no tenderness.  Musculoskeletal: She exhibits no edema.  Lymphadenopathy:    She has no cervical adenopathy.  Neurological: She is alert and oriented to person, place, and time.  Skin: Skin is warm and dry.  Psychiatric: She has a normal mood and affect. Her behavior is normal.  Vitals reviewed.        Assessment & Plan:   Encounter Diagnoses  Name Primary?  . Abdominal pain, unspecified abdominal location Yes  . Obesity, unspecified    abd pain- RESOLVED Reviewed US with pt Counseled to continue with fiber in diet to ensure regularity F/u March as scheduled. RTO sooner prn

## 2015-05-30 ENCOUNTER — Encounter: Payer: Self-pay | Admitting: Physician Assistant

## 2015-05-30 ENCOUNTER — Ambulatory Visit: Payer: Self-pay | Admitting: Physician Assistant

## 2015-05-30 VITALS — BP 120/72 | HR 100 | Temp 98.4°F | Ht 58.5 in | Wt 147.2 lb

## 2015-05-30 DIAGNOSIS — R1031 Right lower quadrant pain: Secondary | ICD-10-CM

## 2015-05-30 DIAGNOSIS — R109 Unspecified abdominal pain: Secondary | ICD-10-CM

## 2015-05-30 LAB — POCT URINALYSIS DIPSTICK
BILIRUBIN UA: NEGATIVE
Blood, UA: NEGATIVE
Glucose, UA: NEGATIVE
KETONES UA: NEGATIVE
Nitrite, UA: NEGATIVE
PROTEIN UA: NEGATIVE
Spec Grav, UA: <=1.005
Urobilinogen, UA: 0.2
pH, UA: 6

## 2015-05-30 NOTE — Progress Notes (Signed)
BP 120/72 mmHg  Pulse 100  Temp(Src) 98.4 F (36.9 C)  Ht 4' 10.5" (1.486 m)  Wt 147 lb 3.2 oz (66.769 kg)  BMI 30.24 kg/m2  SpO2 96%   Subjective:    Patient ID: Janice Wells, female    DOB: 15-Oct-1960, 55 y.o.   MRN: [id number]  HPI: Janice Wells is a 55 y.o. female presenting on 05/30/2015 for Follow-up   HPI  Chief Complaint  Patient presents with  . Follow-up    pt got labs drawn    Pt states she is having abdominal pain now.  She was not having any pain at her December appointment.  Pt states pain RLQ which is where where she c/o pain at October appt.   She has  Taken  IBU for her abd pain several times which helps some.  States hurts usu 2d/wk.  Lasts for about 1-2 hour   She states it feels somewhat better after urinating.    She states no menses since 2007.  Benign [place] 01/2015. Negative iFOBT 04/27/14.  Relevant past medical, surgical, family and social history reviewed and updated as indicated. Interim medical history since our last visit reviewed. Allergies and medications reviewed and updated.  No current outpatient prescriptions on file.   Review of Systems  Constitutional: Negative for fever, chills, diaphoresis, appetite change, fatigue and unexpected weight change.  HENT: Negative for congestion, dental problem, drooling, ear pain, facial swelling, hearing loss, mouth sores, sneezing, sore throat, trouble swallowing and voice change.   Eyes: Negative for pain, discharge, redness, itching and visual disturbance.  Respiratory: Negative for cough, choking, shortness of breath and wheezing.   Cardiovascular: Negative for chest pain, palpitations and leg swelling.  Gastrointestinal: Negative for vomiting, abdominal pain, diarrhea, constipation and blood in stool.  Endocrine: Negative for cold intolerance, heat intolerance and polydipsia.  Genitourinary: Negative for dysuria, hematuria and decreased urine volume.  Musculoskeletal: Negative  for back pain, arthralgias and gait problem.  Skin: Negative for rash.  Allergic/Immunologic: Negative for environmental allergies.  Neurological: Negative for seizures, syncope, light-headedness and headaches.  Hematological: Negative for adenopathy.  Psychiatric/Behavioral: Negative for suicidal ideas, dysphoric mood and agitation. The patient is not nervous/anxious.     Per HPI unless specifically indicated above     Objective:    BP 120/72 mmHg  Pulse 100  Temp(Src) 98.4 F (36.9 C)  Ht 4' 10.5" (1.486 m)  Wt 147 lb 3.2 oz (66.769 kg)  BMI 30.24 kg/m2  SpO2 96%  Wt Readings from Last 3 Encounters:  05/30/15 147 lb 3.2 oz (66.769 kg)  02/15/15 153 lb 3.2 oz (69.491 kg)  01/03/15 152 lb 3.2 oz (69.037 kg)    Physical Exam  Constitutional: She is oriented to person, place, and time. She appears well-developed and well-nourished.  HENT:  Head: Normocephalic and atraumatic.  Neck: Neck supple.  Cardiovascular: Normal rate and regular rhythm.   Pulmonary/Chest: Effort normal and breath sounds normal.  Abdominal: Soft. Bowel sounds are normal. She exhibits no mass. There is no hepatosplenomegaly. There is no tenderness.  Musculoskeletal: She exhibits no edema.  Lymphadenopathy:    She has no cervical adenopathy.  Neurological: She is alert and oriented to person, place, and time.  Skin: Skin is warm and dry.  Psychiatric: She has a normal mood and affect. Her behavior is normal.  Vitals reviewed.        Assessment & Plan:   Encounter Diagnoses  Name Primary?  . Right lower quadrant abdominal pain  Yes  . Flank pain     -check labs -Refer to GI for poss colonoscopy.  Nurse will contact [facility] about [facility] discount application -F/u OV 6 wk.

## 2015-06-14 LAB — CBC WITH DIFFERENTIAL/PLATELET
Basophils Absolute: 74 cells/uL (ref 0–200)
Basophils Relative: 1 %
EOS ABS: 296 {cells}/uL (ref 15–500)
Eosinophils Relative: 4 %
HEMATOCRIT: 39.3 % (ref 35.0–45.0)
Hemoglobin: 12.8 g/dL (ref 11.7–15.5)
LYMPHS PCT: 34 %
Lymphs Abs: 2516 cells/uL (ref 850–3900)
MCH: 28.6 pg (ref 27.0–33.0)
MCHC: 32.6 g/dL (ref 32.0–36.0)
MCV: 87.7 fL (ref 80.0–100.0)
MONO ABS: 518 {cells}/uL (ref 200–950)
MPV: 9.1 fL (ref 7.5–12.5)
Monocytes Relative: 7 %
NEUTROS PCT: 54 %
Neutro Abs: 3996 cells/uL (ref 1500–7800)
Platelets: 296 10*3/uL (ref 140–400)
RBC: 4.48 MIL/uL (ref 3.80–5.10)
RDW: 14.1 % (ref 11.0–15.0)
WBC: 7.4 10*3/uL (ref 3.8–10.8)

## 2015-06-15 LAB — COMPLETE METABOLIC PANEL WITH GFR
ALBUMIN: 4 g/dL (ref 3.6–5.1)
ALK PHOS: 108 U/L (ref 33–130)
ALT: 14 U/L (ref 6–29)
AST: 17 U/L (ref 10–35)
BUN: 14 mg/dL (ref 7–25)
CALCIUM: 9.1 mg/dL (ref 8.6–10.4)
CHLORIDE: 105 mmol/L (ref 98–110)
CO2: 24 mmol/L (ref 20–31)
CREATININE: 0.49 mg/dL — AB (ref 0.50–1.05)
GFR, Est Non African American: >89 mL/min (ref >=60)
Glucose, Bld: 96 mg/dL (ref 65–99)
POTASSIUM: 4.4 mmol/L (ref 3.5–5.3)
Sodium: 139 mmol/L (ref 135–146)
Total Bilirubin: 0.4 mg/dL (ref 0.2–1.2)
Total Protein: 7.7 g/dL (ref 6.1–8.1)

## 2015-07-03 ENCOUNTER — Encounter: Payer: Self-pay | Admitting: Student

## 2015-07-06 ENCOUNTER — Encounter: Payer: Self-pay | Admitting: Physician Assistant

## 2015-07-10 ENCOUNTER — Ambulatory Visit: Payer: Self-pay | Admitting: Physician Assistant

## 2015-07-10 ENCOUNTER — Encounter: Payer: Self-pay | Admitting: Physician Assistant

## 2015-07-10 VITALS — BP 114/64 | HR 68 | Temp 97.7°F | Ht 58.5 in | Wt 146.4 lb

## 2015-07-10 DIAGNOSIS — Z1239 Encounter for other screening for malignant neoplasm of breast: Secondary | ICD-10-CM

## 2015-07-10 DIAGNOSIS — Z1211 Encounter for screening for malignant neoplasm of colon: Secondary | ICD-10-CM

## 2015-07-10 DIAGNOSIS — E669 Obesity, unspecified: Secondary | ICD-10-CM

## 2015-07-10 NOTE — Progress Notes (Signed)
BP 114/64 mmHg  Pulse 68  Temp(Src) 97.7 F (36.5 C)  Ht 4' 10.5" (1.486 m)  Wt 146 lb 6.4 oz (66.407 kg)  BMI 30.07 kg/m2  SpO2 98%   Subjective:    Patient ID: Janice Wells, female    DOB: 10-18-1960, 55 y.o.   MRN: [id number]  HPI: Janice Wells is a 55 y.o. female presenting on 07/10/2015 for Abdominal Pain   HPI   Pt states her abdominal pain is much improved.  She only gets pain maybe once/week and it's not bad then  Pt states she had PAP 2016 at free screening through [facility]  Relevant past medical, surgical, family and social history reviewed and updated as indicated. Interim medical history since our last visit reviewed. Allergies and medications reviewed and updated.  Review of Systems  Constitutional: Negative for fever, chills, diaphoresis, appetite change, fatigue and unexpected weight change.  HENT: Negative for congestion, dental problem, drooling, ear pain, facial swelling, hearing loss, mouth sores, sneezing, sore throat, trouble swallowing and voice change.   Eyes: Negative for pain, discharge, redness, itching and visual disturbance.  Respiratory: Negative for cough, choking, shortness of breath and wheezing.   Cardiovascular: Negative for chest pain, palpitations and leg swelling.  Gastrointestinal: Negative for vomiting, abdominal pain, diarrhea, constipation and blood in stool.  Endocrine: Negative for cold intolerance, heat intolerance and polydipsia.  Genitourinary: Negative for dysuria, hematuria and decreased urine volume.  Musculoskeletal: Negative for back pain, arthralgias and gait problem.  Skin: Negative for rash.  Allergic/Immunologic: Negative for environmental allergies.  Neurological: Negative for seizures, syncope, light-headedness and headaches.  Hematological: Negative for adenopathy.  Psychiatric/Behavioral: Negative for suicidal ideas, dysphoric mood and agitation. The patient is not nervous/anxious.     Per HPI  unless specifically indicated above     Objective:    BP 114/64 mmHg  Pulse 68  Temp(Src) 97.7 F (36.5 C)  Ht 4' 10.5" (1.486 m)  Wt 146 lb 6.4 oz (66.407 kg)  BMI 30.07 kg/m2  SpO2 98%  Wt Readings from Last 3 Encounters:  07/10/15 146 lb 6.4 oz (66.407 kg)  05/30/15 147 lb 3.2 oz (66.769 kg)  02/15/15 153 lb 3.2 oz (69.491 kg)    Physical Exam  Constitutional: She is oriented to person, place, and time. She appears well-developed and well-nourished.  HENT:  Head: Normocephalic and atraumatic.  Neck: Neck supple.  Cardiovascular: Normal rate and regular rhythm.   Pulmonary/Chest: Effort normal and breath sounds normal.  Abdominal: Soft. Bowel sounds are normal. She exhibits no mass. There is no hepatosplenomegaly. There is no tenderness.  Musculoskeletal: She exhibits no edema.  Lymphadenopathy:    She has no cervical adenopathy.  Neurological: She is alert and oriented to person, place, and time.  Skin: Skin is warm and dry.  Psychiatric: She has a normal mood and affect. Her behavior is normal.  Vitals reviewed.   Results for orders placed or performed in visit on 05/30/15  CBC w/Diff/Platelet  Result Value Ref Range   WBC 7.4 3.8 - 10.8 K/uL   RBC 4.48 3.80 - 5.10 MIL/uL   Hemoglobin 12.8 11.7 - 15.5 g/dL   HCT 39.3 35.0 - 45.0 %   MCV 87.7 80.0 - 100.0 fL   MCH 28.6 27.0 - 33.0 pg   MCHC 32.6 32.0 - 36.0 g/dL   RDW 14.1 11.0 - 15.0 %   Platelets 296 140 - 400 K/uL   MPV 9.1 7.5 - 12.5 fL   Neutro Abs 3996  1500 - 7800 cells/uL   Lymphs Abs 2516 850 - 3900 cells/uL   Monocytes Absolute 518 200 - 950 cells/uL   Eosinophils Absolute 296 15 - 500 cells/uL   Basophils Absolute 74 0 - 200 cells/uL   Neutrophils Relative % 54 %   Lymphocytes Relative 34 %   Monocytes Relative 7 %   Eosinophils Relative 4 %   Basophils Relative 1 %   Smear Review Criteria for review not met   COMPLETE METABOLIC PANEL WITH GFR  Result Value Ref Range   Sodium 139 135 - 146  mmol/L   Potassium 4.4 3.5 - 5.3 mmol/L   Chloride 105 98 - 110 mmol/L   CO2 24 20 - 31 mmol/L   Glucose, Bld 96 65 - 99 mg/dL   BUN 14 7 - 25 mg/dL   Creat 0.49 (L) 0.50 - 1.05 mg/dL   Total Bilirubin 0.4 0.2 - 1.2 mg/dL   Alkaline Phosphatase 108 33 - 130 U/L   AST 17 10 - 35 U/L   ALT 14 6 - 29 U/L   Total Protein 7.7 6.1 - 8.1 g/dL   Albumin 4.0 3.6 - 5.1 g/dL   Calcium 9.1 8.6 - 10.4 mg/dL   GFR, Est African American >89 >=60 mL/min   GFR, Est Non African American >89 >=60 mL/min  POCT Urinalysis Dipstick  Result Value Ref Range   Color, UA YELLOW    Clarity, UA CLEAR    Glucose, UA N    Bilirubin, UA N    Ketones, UA N    Spec Grav, UA <=1.005    Blood, UA N    pH, UA 6.0    Protein, UA N    Urobilinogen, UA 0.2    Nitrite, UA N    Leukocytes, UA small (1+) (A) Negative      Assessment & Plan:   Encounter Diagnoses  Name Primary?  . Special screening for malignant neoplasms, colon Yes  . Screening for breast cancer   . Obesity, unspecified     -iFOBT given for colon cancer screening - order screening mammogram -Get pap report- record request sent -F/u 77month.  RTO sooner prn

## 2015-07-21 ENCOUNTER — Ambulatory Visit (HOSPITAL_COMMUNITY)
Admission: RE | Admit: 2015-07-21 | Discharge: 2015-07-21 | Disposition: A | Discharge status: Home / Self Care | Payer: Worker's Compensation | Source: Ambulatory Visit | Attending: Physician Assistant | Admitting: Physician Assistant

## 2016-01-10 ENCOUNTER — Ambulatory Visit: Payer: Self-pay | Admitting: Physician Assistant

## 2016-01-16 ENCOUNTER — Encounter: Payer: Self-pay | Admitting: Physician Assistant

## 2016-01-16 ENCOUNTER — Ambulatory Visit: Payer: Self-pay | Admitting: Physician Assistant

## 2016-01-16 VITALS — BP 118/74 | HR 73 | Temp 97.5°F | Ht 58.5 in | Wt 147.0 lb

## 2016-01-16 DIAGNOSIS — B351 Tinea unguium: Secondary | ICD-10-CM

## 2016-01-16 LAB — COMPLETE METABOLIC PANEL WITH GFR
ALBUMIN: 4 g/dL (ref 3.6–5.1)
ALK PHOS: 96 U/L (ref 33–130)
ALT: 13 U/L (ref 6–29)
AST: 17 U/L (ref 10–35)
BUN: 12 mg/dL (ref 7–25)
CALCIUM: 8.9 mg/dL (ref 8.6–10.4)
CHLORIDE: 107 mmol/L (ref 98–110)
CO2: 26 mmol/L (ref 20–31)
CREATININE: 0.6 mg/dL (ref 0.50–1.05)
GFR, Est Non African American: >89 mL/min (ref >=60)
Glucose, Bld: 90 mg/dL (ref 65–99)
Potassium: 3.8 mmol/L (ref 3.5–5.3)
Sodium: 140 mmol/L (ref 135–146)
Total Bilirubin: 0.5 mg/dL (ref 0.2–1.2)
Total Protein: 7.6 g/dL (ref 6.1–8.1)

## 2016-01-16 NOTE — Patient Instructions (Signed)
Tia de las uas (Nail Ringworm) Usted presenta una infeccin por hongos en las uas de los pies. La parte visible de las uas est formada por clulas muertas que no tienen suministro sanguneo que intervenga en la prevencin de las infecciones. La infeccin se produce debido a que los hongos estn en todas partes. Aprovecharn cualquier oportunidad para crecer sobre material inerte. Esto incluye los tejidos de su cuerpo formados por clulas muertas.  Como las uas tienen un crecimiento muy lento, requieren hasta 2 aos de tratamiento con medicamentos antimicticos. La infeccin involucra a toda la ua, hasta la base. Incluye aproximadamente 1/3 de la ua que no puede verse. Si el profesional le ha prescrito un medicamento por boca, tmelo todos los das. No podr ver ningn progreso hasta que hayan transcurrido entre 6 y 9 meses. No debe preocuparse. La curacin es lenta. Se debe a que el medicamento llega hasta la infeccin de manera muy lenta. Los hongos pueden vivir sobre las clulas muertas con poca o casi ninguna exposicin al suministro de sangre. Esta tambin es la razn por la cual no se observa mejora en los primeros 6 meses. La ua comienza la curacin en la base, donde hay suministro de sangre. La medicacin tpica, como las cremas y los ungentos generalmente no son eficaces. Usted junto al profesional podrn elegir acelerar el proceso de curacin con la extraccin quirrgica de todas las uas. An as, puede necesitar entre 6 y 9 meses de medicamentos por va oral adicionales. Concurra a la consulta con el profesional que lo asiste de acuerdo a lo que le haya indicado. Recuerde que no observar mejora durante al menos 6 meses. Consulte antes con el profesional si aparecen otros signos de infeccin (p. ej. enrojecimiento e hinchazn).   Esta informacin no tiene como fin reemplazar el consejo del mdico. Asegrese de hacerle al mdico cualquier pregunta que tenga.   Document Released:  12/05/2004 Document Revised: 07/12/2014 Elsevier Interactive Patient Education 2016 Elsevier Inc.  

## 2016-01-16 NOTE — Progress Notes (Signed)
   BP 118/74 (BP Location: Left Arm, Patient Position: Sitting, Cuff Size: Normal)   Pulse 73   Temp 97.5 F (36.4 C) (Other (Comment))   Ht 4' 10.5" (1.486 m)   Wt 147 lb (66.7 kg)   SpO2 98%   BMI 30.20 kg/m    Subjective:    Patient ID: Janice Wells, female    DOB: Jul 28, 1960, 55 y.o.   MRN: [id number]  HPI: Janice Wells is a 55 y.o. female presenting on 01/16/2016 for Follow-up   HPI  Pt says she is doing well  [facility]  Requests toenail fungus treatment  Relevant past medical, surgical, family and social history reviewed and updated as indicated. Interim medical history since our last visit reviewed. Allergies and medications reviewed and updated.  No current outpatient prescriptions on file.   Review of Systems  Constitutional: Negative for appetite change, chills, diaphoresis, fatigue, fever and unexpected weight change.  HENT: Negative for congestion, drooling, ear pain, facial swelling, hearing loss, mouth sores, sneezing, sore throat, trouble swallowing and voice change.   Eyes: Negative for pain, discharge, redness, itching and visual disturbance.  Respiratory: Negative for cough, choking, shortness of breath and wheezing.   Cardiovascular: Negative for chest pain, palpitations and leg swelling.  Gastrointestinal: Negative for abdominal pain, blood in stool, constipation, diarrhea and vomiting.  Endocrine: Negative for cold intolerance, heat intolerance and polydipsia.  Genitourinary: Negative for decreased urine volume, dysuria and hematuria.  Musculoskeletal: Negative for arthralgias, back pain and gait problem.  Skin: Negative for rash.  Allergic/Immunologic: Negative for environmental allergies.  Neurological: Negative for seizures, syncope, light-headedness and headaches.  Hematological: Negative for adenopathy.  Psychiatric/Behavioral: Negative for agitation, dysphoric mood and suicidal ideas. The patient  is not nervous/anxious.     Per HPI unless specifically indicated above     Objective:    BP 118/74 (BP Location: Left Arm, Patient Position: Sitting, Cuff Size: Normal)   Pulse 73   Temp 97.5 F (36.4 C) (Other (Comment))   Ht 4' 10.5" (1.486 m)   Wt 147 lb (66.7 kg)   SpO2 98%   BMI 30.20 kg/m   Wt Readings from Last 3 Encounters:  01/16/16 147 lb (66.7 kg)  07/10/15 146 lb 6.4 oz (66.4 kg)  05/30/15 147 lb 3.2 oz (66.8 kg)    Physical Exam  Constitutional: She is oriented to person, place, and time. She appears well-developed and well-nourished.  HENT:  Head: Normocephalic and atraumatic.  Neck: Neck supple.  Cardiovascular: Normal rate and regular rhythm.   Pulmonary/Chest: Effort normal and breath sounds normal.  Abdominal: Soft. Bowel sounds are normal. She exhibits no mass. There is no hepatosplenomegaly. There is no tenderness.  Musculoskeletal: She exhibits no edema.  Lymphadenopathy:    She has no cervical adenopathy.  Neurological: She is alert and oriented to person, place, and time.  Skin: Skin is warm and dry.  Fungus toenails  Psychiatric: She has a normal mood and affect. Her behavior is normal.  Vitals reviewed.       Assessment & Plan:   Encounter Diagnosis  Name Primary?  . Toenail fungus Yes    -discussed length of treatment for toenail fungus and need for liver monitoring.  Pt wishes to proceed.  Pt will Get cmp drawn today. When get results, will call in rx to [facility] in [place] . Pt is given reading information on toenail fungus -f/u 1 year.  RTO sooner prn

## 2016-01-18 ENCOUNTER — Other Ambulatory Visit: Payer: Self-pay | Admitting: Physician Assistant

## 2016-01-18 DIAGNOSIS — B351 Tinea unguium: Secondary | ICD-10-CM

## 2016-01-18 DIAGNOSIS — Z5181 Encounter for therapeutic drug level monitoring: Secondary | ICD-10-CM

## 2016-01-18 MED ORDER — TERBINAFINE HCL 250 MG PO TABS: ORAL_TABLET | ORAL | 0 refills | Status: DC

## 2016-02-22 ENCOUNTER — Other Ambulatory Visit: Payer: Self-pay

## 2016-02-22 DIAGNOSIS — B351 Tinea unguium: Secondary | ICD-10-CM

## 2016-02-22 DIAGNOSIS — Z5181 Encounter for therapeutic drug level monitoring: Secondary | ICD-10-CM

## 2016-02-24 LAB — HEPATIC FUNCTION PANEL
ALT: 12 U/L (ref 6–29)
AST: 17 U/L (ref 10–35)
Albumin: 3.9 g/dL (ref 3.6–5.1)
Alkaline Phosphatase: 101 U/L (ref 33–130)
BILIRUBIN INDIRECT: 0.4 mg/dL (ref 0.2–1.2)
Bilirubin, Direct: 0.1 mg/dL (ref <=0.2)
TOTAL PROTEIN: 7.8 g/dL (ref 6.1–8.1)
Total Bilirubin: 0.5 mg/dL (ref 0.2–1.2)

## 2016-02-27 ENCOUNTER — Other Ambulatory Visit: Payer: Self-pay | Admitting: Physician Assistant

## 2016-02-27 MED ORDER — TERBINAFINE HCL 250 MG PO TABS: ORAL_TABLET | ORAL | 0 refills | Status: DC

## 2016-03-26 ENCOUNTER — Encounter: Payer: Self-pay | Admitting: Student

## 2016-04-08 ENCOUNTER — Encounter: Payer: Self-pay | Admitting: Physician Assistant

## 2016-04-08 ENCOUNTER — Ambulatory Visit: Payer: Self-pay | Admitting: Physician Assistant

## 2016-04-08 VITALS — BP 134/70 | HR 65 | Temp 97.5°F | Ht 58.5 in | Wt 148.5 lb

## 2016-04-08 DIAGNOSIS — M25512 Pain in left shoulder: Secondary | ICD-10-CM

## 2016-04-08 MED ORDER — DICLOFENAC SODIUM 75 MG PO TBEC: DELAYED_RELEASE_TABLET | ORAL | 1 refills | Status: DC

## 2016-04-08 NOTE — Patient Instructions (Signed)
Dolor en el hombro  (Shoulder Pain)  Muchas cosas pueden provocar dolor en el hombro, por ejemplo:  · Una lesión.  · Un movimiento del brazo que se repite una y otra vez de la misma manera (uso excesivo).  · Dolor en las articulaciones (artritis).  CUIDADOS EN EL HOGAR  Tome estas medidas para aliviar el dolor:  · Apriete una pelota blanda o una almohadilla de goma tanto como sea posible. Esto ayuda a evitar la hinchazón. También fortalece el brazo.  · Tome los medicamentos de venta libre y los recetados solamente como se lo haya indicado el médico.  · Si se lo indican, aplique hielo sobre la zona:  ? Ponga el hielo en una bolsa plástica.  ? Coloque una toalla entre la piel y la bolsa de hielo.  ? Coloque el hielo durante 20 minutos, 2 a 3 veces por día. Deje de aplicarse hielo si no ayuda a aliviar el dolor.  · Si le indicaron que use un cabestrillo o un inmovilizador en el hombro:  ? Úselos como se lo hayan indicado.  ? Quíteselos para ducharse o para bañarse.  ? Mueva el brazo lo menos posible.  ? Siga moviendo la mano. Esto ayuda a evitar la hinchazón.  SOLICITE AYUDA SI:  · El dolor empeora.  · Los medicamentos no le alivian el dolor.  · Siente un dolor nuevo en el brazo, la mano o los dedos.    SOLICITE AYUDA DE INMEDIATO SI:  · El brazo, la mano o los dedos:  ? Hormiguean.  ? Están adormecidos.  ? Están hinchados.  ? Están doloridos.  ? Se tornan de color blanco o azul.    Esta información no tiene como fin reemplazar el consejo del médico. Asegúrese de hacerle al médico cualquier pregunta que tenga.  Document Released: 05/24/2008 Document Revised: 06/19/2015 Document Reviewed: 06/20/2014  Elsevier Interactive Patient Education © 2017 Elsevier Inc.

## 2016-04-08 NOTE — Progress Notes (Signed)
BP 134/70 (BP Location: Right Arm, Patient Position: Sitting, Cuff Size: Normal)   Pulse 65   Temp 97.5 F (36.4 C) (Other (Comment))   Ht 4' 10.5" (1.486 m)   Wt 148 lb 8 oz (67.4 kg)   SpO2 98%   BMI 30.51 kg/m    Subjective:    Patient ID: Janice Wells, female    DOB: Apr 10, 1960, 56 y.o.   MRN: [id number]  HPI: Janice Wells is a 56 y.o. female presenting on 04/08/2016 for Arm Pain (c/o pain to left upper arm and shoulder since Saturday)   HPI  Chief Complaint  Patient presents with  . Arm Pain    c/o pain to left upper arm and shoulder since Saturday    L arm pain started Friday night.  It now radiates up into the neck.  No known injury.  It hurts all the time but sometimes it burns.    Pt couldn't go to work today due to the pain.  She works in a [employer]  She lifts and weighs boxes of fruit.   Same level, not from the floor or up high.    She says she was having this pain before but it went away.  About a year ago and it was only a little then.    Pt tried rubbing alcohol on it and wrapping it wth fabric.    Relevant past medical, surgical, family and social history reviewed and updated as indicated. Interim medical history since our last visit reviewed. Allergies and medications reviewed and updated.  CURRENT MEDS: none  Review of Systems  Constitutional: Negative for appetite change, chills, diaphoresis, fatigue, fever and unexpected weight change.  HENT: Negative for congestion, dental problem, drooling, ear pain, facial swelling, hearing loss, mouth sores, sneezing, sore throat, trouble swallowing and voice change.   Eyes: Negative for pain, discharge, redness, itching and visual disturbance.  Respiratory: Negative for cough, choking, shortness of breath and wheezing.   Cardiovascular: Negative for chest pain, palpitations and leg swelling.  Gastrointestinal: Negative for abdominal pain, blood in stool, constipation, diarrhea and  vomiting.  Endocrine: Negative for cold intolerance, heat intolerance and polydipsia.  Genitourinary: Negative for decreased urine volume, dysuria and hematuria.  Musculoskeletal: Negative for arthralgias, back pain and gait problem.  Skin: Negative for rash.  Allergic/Immunologic: Negative for environmental allergies.  Neurological: Negative for seizures, syncope, light-headedness and headaches.  Hematological: Negative for adenopathy.  Psychiatric/Behavioral: Negative for agitation, dysphoric mood and suicidal ideas. The patient is not nervous/anxious.     Per HPI unless specifically indicated above     Objective:    BP 134/70 (BP Location: Right Arm, Patient Position: Sitting, Cuff Size: Normal)   Pulse 65   Temp 97.5 F (36.4 C) (Other (Comment))   Ht 4' 10.5" (1.486 m)   Wt 148 lb 8 oz (67.4 kg)   SpO2 98%   BMI 30.51 kg/m   Wt Readings from Last 3 Encounters:  04/08/16 148 lb 8 oz (67.4 kg)  01/16/16 147 lb (66.7 kg)  07/10/15 146 lb 6.4 oz (66.4 kg)    Physical Exam  Constitutional: She is oriented to person, place, and time. She appears well-developed and well-nourished.  HENT:  Head: Normocephalic and atraumatic.  Neck: Neck supple.  Cardiovascular: Normal rate and regular rhythm.   Pulmonary/Chest: Effort normal and breath sounds normal. She exhibits tenderness. She exhibits no mass and no bony tenderness.    Abdominal: Soft. Bowel sounds are normal. She exhibits no mass.  There is no hepatosplenomegaly. There is no tenderness.  Genitourinary: No breast swelling, tenderness, discharge or bleeding.  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       Left shoulder: She exhibits decreased range of motion and tenderness. She exhibits no bony tenderness, no swelling, no effusion, no deformity and no laceration.  Extension and abduction limited to about 120 degrees.    Lymphadenopathy:    She has no  cervical adenopathy.  Neurological: She is alert and oriented to person, place, and time.  Skin: Skin is warm and dry.  Psychiatric: She has a normal mood and affect. Her behavior is normal.  Vitals reviewed.       Assessment & Plan:   Encounter Diagnosis  Name Primary?  . Acute pain of left shoulder Yes    -rx diclofenac -counseled pt to avoid pouring isopropanol over large amounts of her body -recommended Ice/heat -avoid overexertion/heavy lifting -pt to follow up as scheduled.  RTO sooner prn worsening or new symptoms -

## 2016-05-23 ENCOUNTER — Encounter: Payer: Self-pay | Admitting: Physician Assistant

## 2016-05-23 ENCOUNTER — Ambulatory Visit: Payer: Self-pay | Admitting: Physician Assistant

## 2016-05-23 VITALS — BP 128/70 | HR 84 | Temp 97.7°F | Resp 26 | Ht 58.5 in | Wt 144.5 lb

## 2016-05-23 DIAGNOSIS — J069 Acute upper respiratory infection, unspecified: Secondary | ICD-10-CM

## 2016-05-23 NOTE — Patient Instructions (Signed)
Infeccin del tracto respiratorio superior, adultos (Upper Respiratory Infection, Adult) La mayora de las infecciones del tracto respiratorio superior son infecciones virales de las vas que llevan el aire a los pulmones. Un infeccin del tracto respiratorio superior afecta la nariz, la garganta y las vas respiratorias superiores. El tipo ms frecuente de infeccin del tracto respiratorio superior es la nasofaringitis, que habitualmente se conoce como "resfro comn". Las infecciones del tracto respiratorio superior siguen su curso y por lo general se curan solas. En la mayora de los casos, la infeccin del tracto respiratorio superior no requiere atencin mdica, pero a veces, despus de una infeccin viral, puede surgir una infeccin bacteriana en las vas respiratorias superiores. Esto se conoce como infeccin secundaria. Las infecciones sinusales y en el odo medio son tipos frecuentes de infecciones secundarias en el tracto respiratorio superior. La neumona bacteriana tambin puede complicar un cuadro de infeccin del tracto respiratorio superior. Este tipo de infeccin puede empeorar el asma y la enfermedad pulmonar obstructiva crnica (EPOC). En algunos casos, estas complicaciones pueden requerir atencin mdica de emergencia y poner en peligro la vida. CAUSAS Casi todas las infecciones del tracto respiratorio superior se deben a los virus. Un virus es un tipo de microbio que puede contagiarse de una persona a otra. FACTORES DE RIESGO Puede estar en riesgo de sufrir una infeccin del tracto respiratorio superior si:  Fuma.  Tiene una enfermedad pulmonar o cardaca crnica.  Tiene debilitado el sistema de defensa (inmunitario) del cuerpo.  Es muy joven o de edad muy avanzada.  Tiene asma o alergias nasales.  Trabaja en reas donde hay mucha gente o poca ventilacin.  Trabaja en una escuela o en un centro de atencin mdica. SIGNOS Y SNTOMAS Habitualmente, los sntomas aparecen de  2a 3das despus de entrar en contacto con el virus del resfro. La mayora de las infecciones virales en el tracto respiratorio superior duran de 7a 10das. Sin embargo, las infecciones virales en el tracto respiratorio superior a causa del virus de la gripe pueden durar de 14a 18das y, habitualmente, son ms graves. Entre los sntomas se pueden incluir los siguientes:  Secrecin o congestin nasal.  Estornudos.  Tos.  Dolor de garganta.  Dolor de cabeza.  Fatiga.  Fiebre.  Prdida del apetito.  Dolor en la frente, detrs de los ojos y por encima de los pmulos (dolor sinusal).  Dolores musculares. DIAGNSTICO El mdico puede diagnosticar una infeccin del tracto respiratorio superior mediante los siguientes estudios:  Examen fsico.  Pruebas para verificar si los sntomas no se deben a otra afeccin, por ejemplo:  Faringitis estreptoccica.  Sinusitis.  Neumona.  Asma. TRATAMIENTO Esta infeccin desaparece sola, con el tiempo. No puede curarse con medicamentos, pero a menudo se prescriben para aliviar los sntomas. Los medicamentos pueden ser tiles para lo siguiente:  Bajar la fiebre.  Reducir la tos.  Aliviar la congestin nasal. INSTRUCCIONES PARA EL CUIDADO EN EL HOGAR  Tome los medicamentos solamente como se lo haya indicado el mdico.  A fin de aliviar el dolor de garganta, haga grgaras con solucin salina templada o consuma caramelos para la tos, como se lo haya indicado el mdico.  Use un humidificador de vapor clido o inhale el vapor de la ducha para aumentar la humedad del aire. Esto facilitar la respiracin.  Beba suficiente lquido para mantener la orina clara o de color amarillo plido.  Consuma sopas y otros caldos transparentes, y alimntese bien.  Descanse todo lo que sea necesario.  Regrese al trabajo cuando   la temperatura se le haya normalizado o cuando el mdico lo autorice. Es posible que deba quedarse en su casa durante un  tiempo prolongado, para no infectar a los dems. Tambin puede usar un barbijo y lavarse las manos con cuidado para evitar la propagacin del virus.  Aumente el uso del inhalador si tiene asma.  No consuma ningn producto que contenga tabaco, lo que incluye cigarrillos, tabaco de mascar o cigarrillos electrnicos. Si necesita ayuda para dejar de fumar, consulte al mdico. PREVENCIN La mejor manera de protegerse de un resfro es mantener una higiene adecuada.  Evite el contacto oral o fsico con personas que tengan sntomas de resfro.  En caso de contacto, lvese las manos con frecuencia. No hay pruebas claras de que la vitaminaC, la vitaminaE, la equincea o el ejercicio reduzcan la probabilidad de contraer un resfro. Sin embargo, siempre se recomienda descansar mucho, hacer ejercicio y alimentarse bien. SOLICITE ATENCIN MDICA SI:  Su estado empeora en lugar de mejorar.  Los medicamentos no logran controlar los sntomas.  Tiene escalofros.  La sensacin de falta de aire empeora.  Tiene mucosidad marrn o roja.  Tiene secrecin nasal amarilla o marrn.  Le duele la cara, especialmente al inclinarse hacia adelante.  Tiene fiebre.  Tiene los ganglios del cuello hinchados.  Siente dolor al tragar.  Tiene zonas blancas en la parte de atrs de la garganta. SOLICITE ATENCIN MDICA DE INMEDIATO SI:  Tiene sntomas intensos o persistentes de:  Dolor de cabeza.  Dolor de odos.  Dolor sinusal.  Dolor en el pecho.  Tiene enfermedad pulmonar crnica y cualquiera de estos sntomas:  Sibilancias.  Tos prolongada.  Tos con sangre.  Cambio en la mucosidad habitual.  Presenta rigidez en el cuello.  Tiene cambios en:  La visin.  La audicin.  El pensamiento.  El estado de nimo. ASEGRESE DE QUE:  Comprende estas instrucciones.  Controlar su afeccin.  Recibir ayuda de inmediato si no mejora o si empeora. Esta informacin no tiene como fin  reemplazar el consejo del mdico. Asegrese de hacerle al mdico cualquier pregunta que tenga. Document Released: 12/05/2004 Document Revised: 07/12/2014 Document Reviewed: 06/02/2013 Elsevier Interactive Patient Education  2017 Elsevier Inc.  

## 2016-05-23 NOTE — Progress Notes (Signed)
BP 128/70 (BP Location: Left Arm, Patient Position: Sitting, Cuff Size: Normal)   Pulse 84   Temp 97.7 F (36.5 C) (Other (Comment))   Resp (!) 26   Ht 4' 10.5" (1.486 m)   Wt 144 lb 8 oz (65.5 kg)   SpO2 97%   BMI 29.69 kg/m    Subjective:    Patient ID: Janice Wells, female    DOB: [birth year], 58 y.o.   MRN: [id number]  HPI: Janice Wells is a 56 y.o. female presenting on 05/23/2016 for Sore Throat and Shortness of Breath   HPI    ST since Sunday.  Hoarse today.  Didn't go to work today.  She says this morning she had thick phlegm which made it difficult to breath until she coughed it out.      + cough.  No EA.  No fever.    She says her emplooyer is making her bring a note in because she missed work.   Relevant past medical, surgical, family and social history reviewed and updated as indicated. Interim medical history since our last visit reviewed. Allergies and medications reviewed and updated.  No current outpatient prescriptions on file.   Review of Systems  Constitutional: Negative for appetite change, chills, diaphoresis, fatigue, fever and unexpected weight change.  HENT: Negative for congestion, dental problem, drooling, ear pain, facial swelling, hearing loss, mouth sores, sneezing, sore throat, trouble swallowing and voice change.   Eyes: Negative for pain, discharge, redness, itching and visual disturbance.  Respiratory: Negative for cough, choking, shortness of breath and wheezing.   Cardiovascular: Negative for chest pain, palpitations and leg swelling.  Gastrointestinal: Negative for abdominal pain, blood in stool, constipation, diarrhea and vomiting.  Endocrine: Negative for cold intolerance, heat intolerance and polydipsia.  Genitourinary: Negative for decreased urine volume, dysuria and hematuria.  Musculoskeletal: Negative for arthralgias, back pain and gait problem.  Skin: Negative for rash.  Allergic/Immunologic: Negative for  environmental allergies.  Neurological: Negative for seizures, syncope, light-headedness and headaches.  Hematological: Negative for adenopathy.  Psychiatric/Behavioral: Negative for agitation, dysphoric mood and suicidal ideas. The patient is not nervous/anxious.     Per HPI unless specifically indicated above     Objective:    BP 128/70 (BP Location: Left Arm, Patient Position: Sitting, Cuff Size: Normal)   Pulse 84   Temp 97.7 F (36.5 C) (Other (Comment))   Resp (!) 26   Ht 4' 10.5" (1.486 m)   Wt 144 lb 8 oz (65.5 kg)   SpO2 97%   BMI 29.69 kg/m   Wt Readings from Last 3 Encounters:  05/23/16 144 lb 8 oz (65.5 kg)  04/08/16 148 lb 8 oz (67.4 kg)  01/16/16 147 lb (66.7 kg)    Physical Exam  Constitutional: She is oriented to person, place, and time. She appears well-developed and well-nourished.  HENT:  Head: Normocephalic and atraumatic.  Right Ear: Hearing, tympanic membrane, external ear and ear canal normal.  Left Ear: Hearing, tympanic membrane, external ear and ear canal normal.  Nose: Nose normal.  Mouth/Throat: Uvula is midline and oropharynx is clear and moist. No oropharyngeal exudate.  Neck: Neck supple.  Cardiovascular: Normal rate and regular rhythm.   Pulmonary/Chest: Effort normal and breath sounds normal. She has no wheezes.  Lymphadenopathy:    She has no cervical adenopathy.  Neurological: She is alert and oriented to person, place, and time.  Skin: Skin is warm and dry.  Psychiatric: She has a normal mood and affect. Her behavior  is normal.  Vitals reviewed.       Assessment & Plan:   Encounter Diagnosis  Name Primary?  . Acute upper respiratory infection Yes  '  -recommended OTC mucinex, increase fluids, rest.  APAP or IBU prn. Warm salt water gargles. -gave handout on URI -work note given -follow up as scheduled.  RTO sooner prn

## 2016-06-12 ENCOUNTER — Encounter: Payer: Self-pay | Admitting: Physician Assistant

## 2016-08-27 ENCOUNTER — Encounter: Payer: Self-pay | Admitting: Physician Assistant

## 2016-08-27 ENCOUNTER — Ambulatory Visit: Payer: Self-pay | Admitting: Physician Assistant

## 2016-08-27 VITALS — BP 118/70 | HR 84 | Temp 98.1°F | Ht 58.5 in | Wt 149.5 lb

## 2016-08-27 DIAGNOSIS — M545 Low back pain: Secondary | ICD-10-CM

## 2016-08-27 LAB — POCT URINALYSIS DIPSTICK
BILIRUBIN UA: NEGATIVE
GLUCOSE UA: NEGATIVE
KETONES UA: NEGATIVE
NITRITE UA: NEGATIVE
Protein, UA: NEGATIVE
RBC UA: NEGATIVE
Spec Grav, UA: 1.01 (ref 1.010–1.025)
Urobilinogen, UA: 0.2 E.U./dL
pH, UA: 6 (ref 5.0–8.0)

## 2016-08-27 MED ORDER — NAPROXEN 500 MG PO TABS: ORAL_TABLET | ORAL | 1 refills | Status: DC

## 2016-08-27 NOTE — Patient Instructions (Signed)
Dolor de espalda en adultos (Back Pain, Adult) El dolor de espalda es muy frecuente en los adultos.La causa del dolor de espalda es rara vez peligrosa y el dolor a menudo mejora con el tiempo.Es posible que se desconozca la causa de esta afeccin. Algunas causas comunes son las siguientes:  Distensin de los msculos o ligamentos que sostienen la columna vertebral.  Desgaste (degeneracin) de los discos vertebrales.  Artritis.  Lesiones directas en la espalda. En muchas personas, el dolor de espalda es recurrente. Como rara vez es peligroso, las personas pueden aprender a manejar esta afeccin por s mismas. INSTRUCCIONES PARA EL CUIDADO EN EL HOGAR Controle su dolor de espalda a fin de detectar algn cambio. Las siguientes indicaciones ayudarn a aliviar cualquier molestia que pueda sentir:  Permanezca activo. Si permanece sentado o de pie en un mismo lugar durante mucho tiempo, se tensiona la espalda. No se siente, conduzca o permanezca de pie en un mismo lugar durante ms de 30 minutos seguidos. Realice caminatas cortas en superficies planas tan pronto como le sea posible.Trate de caminar un poco ms de tiempo cada da.  Haga ejercicio regularmente como se lo haya indicado el mdico. El ejercicio ayuda a que su espalda se cure ms rpidamente. Tambin ayuda a prevenir futuras lesiones al mantener los msculos fuertes y flexibles.  No permanezca en la cama.Si hace reposo ms de 1 a 2 das, puede demorar su recuperacin.  Preste atencin a su cuerpo al inclinarse y levantarse. Las posiciones ms cmodas son las que ejercen menos tensin en la espalda en recuperacin. Siempre use tcnicas apropiadas para levantar objetos, como por ejemplo:  Flexionar las rodillas.  Mantener la carga cerca del cuerpo.  No torcerse.  Encuentre una posicin cmoda para dormir. Use un colchn firme y recustese de costado con las rodillas ligeramente flexionadas. Si se recuesta sobre la espalda, coloque  una almohada debajo de las rodillas.  Evite sentir ansiedad o estrs.El estrs aumenta la tensin muscular y puede empeorar el dolor de espalda.Es importante reconocer si se siente ansioso o estresado y aprender maneras de controlarlo, por ejemplo haciendo ejercicio.  Tome los medicamentos solamente como se lo haya indicado el mdico. Los medicamentos de venta libre para aliviar el dolor y la inflamacin a menudo son los ms eficaces.El mdico puede recetarle relajantes musculares.Estos medicamentos ayudan a calmar el dolor de modo que pueda reanudar ms rpidamente sus actividades normales y el ejercicio saludable.  Aplique hielo sobre la zona lesionada.  Ponga el hielo en una bolsa plstica.  Coloque una toalla entre la piel y la bolsa de hielo.  Deje el hielo durante 20minutos, 2 a 3veces por da, durante los primeros 2 o 3das. Despus de eso, puede alternar el hielo y el calor para reducir el dolor y los espasmos.  Mantenga un peso saludable. El exceso de peso ejerce presin adicional sobre la espalda y hace que resulte difcil mantener una buena postura. SOLICITE ATENCIN MDICA SI:  Siente un dolor que no se alivia con reposo o medicamentos.  Siente mucho dolor que se extiende a las piernas o los glteos.  El dolor no mejora en una semana.  Siente dolor por la noche.  Pierde peso.  Siente escalofros o fiebre. SOLICITE ATENCIN MDICA DE INMEDIATO SI:  Tiene nuevos problemas para controlar la vejiga o los intestinos.  Siente debilidad o adormecimiento inusuales en los brazos o en las piernas.  Siente nuseas o vmitos.  Siente dolor abdominal.  Siente que va a desmayarse. Esta informacin   no tiene como fin reemplazar el consejo del mdico. Asegrese de hacerle al mdico cualquier pregunta que tenga. Document Released: 02/25/2005 Document Revised: 03/18/2014 Document Reviewed: 06/29/2013 Elsevier Interactive Patient Education  2017 [employer]vier Inc.  

## 2016-08-27 NOTE — Progress Notes (Signed)
BP 118/70 (BP Location: Left Arm, Patient Position: Sitting, Cuff Size: Normal)   Pulse 84   Temp 98.1 F (36.7 C)   Ht 4' 10.5" (1.486 m)   Wt 149 lb 8 oz (67.8 kg)   SpO2 97%   BMI 30.71 kg/m    Subjective:    Patient ID: Janice Wells, female    DOB: [birth year], 58 y.o.   MRN: [id number]  HPI: Janice Wells is a 56 y.o. female presenting on 08/27/2016 for Back Pain (mid low back. pt states it started 3 weeks ago)   HPI   Pt c/o back pain x 3 wk that started suddenly while at work when she bent over to lift a box of grapes at work.   Pt states that sitting down makes it feel better.   She has to stand all day at work.  She has tried no OTC medication but is drinking natural juices for it.   Relevant past medical, surgical, family and social history reviewed and updated as indicated. Interim medical history since our last visit reviewed. Allergies and medications reviewed and updated.  No current outpatient prescriptions on file.   Review of Systems  Constitutional: Negative for appetite change, chills, diaphoresis, fatigue, fever and unexpected weight change.  HENT: Negative for congestion, dental problem, drooling, ear pain, facial swelling, hearing loss, mouth sores, sneezing, sore throat, trouble swallowing and voice change.   Eyes: Negative for pain, discharge, redness, itching and visual disturbance.  Respiratory: Negative for cough, choking, shortness of breath and wheezing.   Cardiovascular: Negative for chest pain, palpitations and leg swelling.  Gastrointestinal: Negative for abdominal pain, blood in stool, constipation, diarrhea and vomiting.  Endocrine: Negative for cold intolerance, heat intolerance and polydipsia.  Genitourinary: Negative for decreased urine volume, dysuria and hematuria.  Musculoskeletal: Positive for back pain. Negative for arthralgias and gait problem.  Skin: Negative for rash.  Allergic/Immunologic: Negative for  environmental allergies.  Neurological: Negative for seizures, syncope, light-headedness and headaches.  Hematological: Negative for adenopathy.  Psychiatric/Behavioral: Negative for agitation, dysphoric mood and suicidal ideas. The patient is not nervous/anxious.     Per HPI unless specifically indicated above     Objective:    BP 118/70 (BP Location: Left Arm, Patient Position: Sitting, Cuff Size: Normal)   Pulse 84   Temp 98.1 F (36.7 C)   Ht 4' 10.5" (1.486 m)   Wt 149 lb 8 oz (67.8 kg)   SpO2 97%   BMI 30.71 kg/m   Wt Readings from Last 3 Encounters:  08/27/16 149 lb 8 oz (67.8 kg)  05/23/16 144 lb 8 oz (65.5 kg)  04/08/16 148 lb 8 oz (67.4 kg)    Physical Exam  Constitutional: She is oriented to person, place, and time. She appears well-developed and well-nourished.  HENT:  Head: Normocephalic and atraumatic.  Neck: Neck supple.  Cardiovascular: Normal rate and regular rhythm.   Pulmonary/Chest: Effort normal and breath sounds normal.  Abdominal: Soft. Bowel sounds are normal. She exhibits no mass. There is no hepatosplenomegaly. There is no tenderness. There is no CVA tenderness.  Musculoskeletal: She exhibits no edema.       Thoracic back: Normal.       Lumbar back: She exhibits tenderness. She exhibits normal range of motion, no bony tenderness, no swelling, no edema and no deformity.  Mild non-point soft tissue tenderness.  - SLR  Lymphadenopathy:    She has no cervical adenopathy.  Neurological: She is alert and oriented to person,  place, and time. Coordination and gait normal.  Reflex Scores:      Patellar reflexes are 2+ on the right side and 2+ on the left side. Skin: Skin is warm and dry.  Psychiatric: She has a normal mood and affect. Her behavior is normal.  Vitals reviewed.   Results for orders placed or performed in visit on 08/27/16  POCT Urinalysis Dipstick  Result Value Ref Range   Color, UA YELLOW    Clarity, UA CLEAR    Glucose, UA NEG     Bilirubin, UA NEG    Ketones, UA NEG    Spec Grav, UA 1.010 1.010 - 1.025   Blood, UA NEG    pH, UA 6.0 5.0 - 8.0   Protein, UA NEG    Urobilinogen, UA 0.2 0.2 or 1.0 E.U./dL   Nitrite, UA NEG    Leukocytes, UA Trace (A) Negative      Assessment & Plan:    Encounter Diagnosis  Name Primary?  . Low back pain, unspecified back pain laterality, unspecified chronicity, with sciatica presence unspecified Yes    -rx naprosyn bid x 2 wk then prn -Heat/ice -follow up as scheduled. RTO sooner prn

## 2017-01-08 ENCOUNTER — Ambulatory Visit: Payer: Self-pay | Admitting: Physician Assistant

## 2017-01-14 ENCOUNTER — Encounter: Payer: Self-pay | Admitting: Physician Assistant

## 2017-01-14 ENCOUNTER — Ambulatory Visit: Payer: Self-pay | Admitting: Physician Assistant

## 2017-01-14 VITALS — BP 132/78 | HR 87 | Temp 97.5°F | Ht 58.5 in | Wt 152.0 lb

## 2017-01-14 DIAGNOSIS — Z1239 Encounter for other screening for malignant neoplasm of breast: Secondary | ICD-10-CM

## 2017-01-14 DIAGNOSIS — Z1211 Encounter for screening for malignant neoplasm of colon: Secondary | ICD-10-CM

## 2017-01-14 DIAGNOSIS — K0889 Other specified disorders of teeth and supporting structures: Secondary | ICD-10-CM

## 2017-01-14 DIAGNOSIS — E669 Obesity, unspecified: Secondary | ICD-10-CM

## 2017-01-14 DIAGNOSIS — Z13220 Encounter for screening for lipoid disorders: Secondary | ICD-10-CM

## 2017-01-14 NOTE — Progress Notes (Signed)
BP 132/78 (BP Location: Left Arm, Patient Position: Sitting, Cuff Size: Normal)   Pulse 87   Temp (!) 97.5 F (36.4 C) (Other (Comment))   Ht 4' 10.5" (1.486 m)   Wt 152 lb (68.9 kg)   SpO2 97%   BMI 31.23 kg/m    Subjective:    Patient ID: Janice Wells, female    DOB: [birth year], 58 y.o.   MRN: [id number]  HPI: Janice Wells is a 56 y.o. female presenting on 01/14/2017 for Follow-up and Dental Pain   HPI  Pt says her teeth don't hurt.  She says her gums hurt. That has been going on for 5 months when 2 of her teeth fell out.  Other than her mouth pain, she is doing well.   Relevant past medical, surgical, family and social history reviewed and updated as indicated. Interim medical history since our last visit reviewed. Allergies and medications reviewed and updated.   No current outpatient medications on file.  Review of Systems  Constitutional: Negative for appetite change, chills, diaphoresis, fatigue, fever and unexpected weight change.  HENT: Positive for dental problem. Negative for congestion, drooling, ear pain, facial swelling, hearing loss, mouth sores, sneezing, sore throat, trouble swallowing and voice change.   Eyes: Negative for pain, discharge, redness, itching and visual disturbance.  Respiratory: Negative for cough, choking, shortness of breath and wheezing.   Cardiovascular: Negative for chest pain, palpitations and leg swelling.  Gastrointestinal: Negative for abdominal pain, blood in stool, constipation, diarrhea and vomiting.  Endocrine: Negative for cold intolerance, heat intolerance and polydipsia.  Genitourinary: Negative for decreased urine volume, dysuria and hematuria.  Musculoskeletal: Negative for arthralgias, back pain and gait problem.  Skin: Negative for rash.  Allergic/Immunologic: Negative for environmental allergies.  Neurological: Negative for seizures, syncope, light-headedness and headaches.  Hematological:  Negative for adenopathy.  Psychiatric/Behavioral: Negative for agitation, dysphoric mood and suicidal ideas. The patient is not nervous/anxious.     Per HPI unless specifically indicated above     Objective:    BP 132/78 (BP Location: Left Arm, Patient Position: Sitting, Cuff Size: Normal)   Pulse 87   Temp (!) 97.5 F (36.4 C) (Other (Comment))   Ht 4' 10.5" (1.486 m)   Wt 152 lb (68.9 kg)   SpO2 97%   BMI 31.23 kg/m   Wt Readings from Last 3 Encounters:  01/14/17 152 lb (68.9 kg)  08/27/16 149 lb 8 oz (67.8 kg)  05/23/16 144 lb 8 oz (65.5 kg)    Physical Exam  Constitutional: She is oriented to person, place, and time. She appears well-developed and well-nourished.  HENT:  Head: Normocephalic and atraumatic.  Mouth/Throat: Uvula is midline. Abnormal dentition. Dental caries present. No dental abscesses or uvula swelling.  Extensive dental decay and gingival inflammation. No abscess seen  Neck: Neck supple.  Cardiovascular: Normal rate and regular rhythm.  Pulmonary/Chest: Effort normal and breath sounds normal.  Abdominal: Soft. Bowel sounds are normal. She exhibits no mass. There is no hepatosplenomegaly. There is no tenderness.  Musculoskeletal: She exhibits no edema.  Lymphadenopathy:    She has no cervical adenopathy.  Neurological: She is alert and oriented to person, place, and time.  Skin: Skin is warm and dry.  Psychiatric: She has a normal mood and affect. Her behavior is normal.  Vitals reviewed.       Assessment & Plan:   Encounter Diagnoses  Name Primary?  Norva Riffle. Dentalgia Yes  . Screening for colon cancer   . Screening for  breast cancer   . Screening cholesterol level   . Obesity, unspecified classification, unspecified obesity type, unspecified whether serious comorbidity present      -will order screening Mammogram -pt is given iFOBT for colon cancer screening -will check fasting labs and call pt with results -will put pt on Dental list -pt  counseled to rinse with warm salt water frequently and brush bid -pt to follow up 1 year.  RTO sooner prn

## 2017-01-15 ENCOUNTER — Other Ambulatory Visit: Payer: Self-pay | Admitting: Physician Assistant

## 2017-01-15 DIAGNOSIS — Z1211 Encounter for screening for malignant neoplasm of colon: Secondary | ICD-10-CM

## 2017-01-15 LAB — IFOBT (OCCULT BLOOD): IFOBT: NEGATIVE

## 2017-01-22 ENCOUNTER — Other Ambulatory Visit (HOSPITAL_COMMUNITY)
Admission: RE | Admit: 2017-01-22 | Discharge: 2017-01-22 | Disposition: A | Discharge status: Home / Self Care | Payer: Worker's Compensation | Source: Ambulatory Visit | Attending: Physician Assistant | Admitting: Physician Assistant

## 2017-01-22 DIAGNOSIS — Z13220 Encounter for screening for lipoid disorders: Secondary | ICD-10-CM | POA: Insufficient documentation

## 2017-01-22 LAB — LIPID PANEL
Cholesterol: 180 mg/dL (ref 0–200)
HDL: 37 mg/dL — AB (ref >40)
LDL Cholesterol: 121 mg/dL — ABNORMAL HIGH (ref 0–99)
TRIGLYCERIDES: 110 mg/dL (ref <150)
Total CHOL/HDL Ratio: 4.9 RATIO
VLDL: 22 mg/dL (ref 0–40)

## 2017-01-22 LAB — COMPREHENSIVE METABOLIC PANEL
ALT: 19 U/L (ref 14–54)
ANION GAP: 6 (ref 5–15)
AST: 21 U/L (ref 15–41)
Albumin: 4 g/dL (ref 3.5–5.0)
Alkaline Phosphatase: 99 U/L (ref 38–126)
BUN: 12 mg/dL (ref 6–20)
CHLORIDE: 105 mmol/L (ref 101–111)
CO2: 28 mmol/L (ref 22–32)
Calcium: 9.1 mg/dL (ref 8.9–10.3)
Creatinine, Ser: 0.54 mg/dL (ref 0.44–1.00)
GFR calc non Af Amer: >60 mL/min (ref >60)
Glucose, Bld: 106 mg/dL — ABNORMAL HIGH (ref 65–99)
POTASSIUM: 4 mmol/L (ref 3.5–5.1)
SODIUM: 139 mmol/L (ref 135–145)
Total Bilirubin: 0.6 mg/dL (ref 0.3–1.2)
Total Protein: 8 g/dL (ref 6.5–8.1)

## 2017-01-24 ENCOUNTER — Other Ambulatory Visit: Payer: Self-pay | Admitting: Physician Assistant

## 2017-01-24 DIAGNOSIS — Z1231 Encounter for screening mammogram for malignant neoplasm of breast: Secondary | ICD-10-CM

## 2017-10-08 ENCOUNTER — Ambulatory Visit: Payer: Self-pay | Admitting: Physician Assistant

## 2017-10-08 ENCOUNTER — Encounter: Payer: Self-pay | Admitting: Physician Assistant

## 2017-10-08 VITALS — BP 132/60 | HR 76 | Temp 97.7°F | Ht 58.5 in | Wt 151.0 lb

## 2017-10-08 DIAGNOSIS — M545 Low back pain: Secondary | ICD-10-CM

## 2017-10-08 MED ORDER — PREDNISONE 10 MG PO TABS: ORAL_TABLET | ORAL | 0 refills | Status: AC

## 2017-10-08 MED ORDER — CYCLOBENZAPRINE HCL 10 MG PO TABS: ORAL_TABLET | ORAL | 0 refills | Status: DC

## 2017-10-08 NOTE — Patient Instructions (Signed)
Distensin lumbar con rehabilitacin (Low Back Strain With Rehab) Una distensin es un estiramiento o un desgarro en un msculo o los fuertes cordones de tejido que adhieren el msculo al hueso (tendones). Las distensiones en la parte inferior de la espalda (columna lumbar) son una causa frecuente de dolor lumbar. Una distensin ocurre cuando los msculos o tendones se desgarran o se estiran ms all de su lmite. Los msculos se pueden inflamar y, por lo tanto, provocar dolor y contraccin repentina del msculo (espasmos). Una distensin puede ocurrir de repente debido a una lesin (traumatismo) o se puede desarrollar gradualmente debido al uso excesivo. Hay tres tipos de distensiones:  El grado1 es una distensin leve que se caracteriza por un desgarro menor de las fibras o tendones musculares. Esto puede provocar un poco de dolor, pero no hay prdida de fuerza muscular.  El grado2 es una distensin moderada producida por un desgarro parcial de las fibras o tendones musculares. Esto provoca un dolor ms intenso y cierta prdida de fuerza muscular.  El grado3 es una distensin grave e implica la ruptura completa del msculo o del tendn. Esto causa un dolor intenso y la prdida completa o casi completa de la fuerza muscular. CAUSAS Esta afeccin puede ser causada por lo siguiente:  Traumatismo, debido, por ejemplo, a una cada o a un golpe en el cuerpo.  Torsin o hiperdistensin de la espalda. Esto puede ser el resultado de realizar actividades que requieren mucha energa, como levantar objetos pesados. FACTORES DE RIESGO Los siguientes factores pueden aumentar el riesgo de sufrir esta afeccin:  Practicar deportes de contacto.  Participar en deportes o actividades que sobrecargan la espalda e implican mucha flexin y torsin, por ejemplo: ? Levantar pesas u objetos pesados. ? Gimnasia. ? Ftbol. ? Patinaje artstico. ? Snowboard.  Tener exceso de peso u obesidad.  Tener poca  fuerza y flexibilidad. SNTOMAS Los sntomas de esta afeccin pueden incluir los siguientes:  Dolor agudo o sordo en la parte inferior de la espalda que no desaparece. El dolor se puede extender hacia las nalgas.  Rigidez.  Amplitud de movimientos limitada.  Incapacidad para pararse derecho debido a la rigidez o al dolor.  Espasmos musculares. DIAGNSTICO Esta afeccin se puede diagnosticar en funcin de lo siguiente:  Sus sntomas.  Sus antecedentes mdicos.  Un examen fsico. ? El mdico puede presionar sobre ciertas zonas de la espalda para determinar el origen de su dolor. ? Le puede pedir que se incline hacia adelante, hacia atrs y de un lado al otro para evaluar la intensidad del dolor y la amplitud de movimiento.  Pruebas de diagnstico por imgenes, como: ? Radiografas. ? Resonancia magntica (RM). TRATAMIENTO El tratamiento de esta afeccin puede incluir lo siguiente:  Aplicar calor y fro en la zona afectada.  Tomar medicamentos para aliviar el dolor y relajar los msculos (relajantes musculares).  Tomar antiinflamatorios no esteroides (AINE) para ayudar a reducir la hinchazn y las molestias.  Realizar fisioterapia. Cuando los sntomas mejoran, es importante volver a su rutina habitual tan pronto como sea posible para reducir el dolor, y evitar la rigidez y la prdida de fuerza muscular. En general, los sntomas deberan mejorar en 6semanas de tratamiento. Sin embargo, el tiempo de recuperacin vara. INSTRUCCIONES PARA EL CUIDADO EN EL HOGAR Control del dolor, de la rigidez y de la hinchazn  Si se lo indic el mdico, aplique hielo en la zona afectada durante las primeras 24horas despus de la lesin. ? Ponga el hielo en una bolsa plstica. ?   Coloque una toalla entre la piel y la bolsa de hielo. ? Coloque el hielo durante 20minutos, 2 a 3veces por da.  Si se lo indican, aplique calor en la zona afectada tan frecuentemente como se lo haya indicado el  mdico. Use la fuente de calor que el mdico le recomiende, como una compresa de calor hmedo o una almohadilla trmica. ? Coloque una toalla entre la piel y la fuente de calor. ? Aplique el calor durante 20 a 30minutos. ? Retire la fuente de calor si la piel se le pone de color rojo brillante. Esto es muy importante si no puede sentir el dolor, el calor o el fro. Puede correr un riesgo mayor de sufrir quemaduras. Actividad  Descanse y retome sus actividades normales como se lo haya indicado el mdico. Pregntele al mdico qu actividades son seguras para usted.  Evite las actividades que demandan mucho esfuerzo (que son extenuantes) durante el tiempo que le haya indicado el mdico.  Haga ejercicios como se lo haya indicado el mdico. Instrucciones generales  Tome los medicamentos de venta libre y los recetados solamente como se lo haya indicado el mdico.  Si tiene alguna pregunta o inquietud sobre la seguridad mientras toma analgsicos, hable con el mdico.  No conduzca ni opere maquinaria pesada hasta saber cmo lo afectan los analgsicos.  No consuma ningn producto que contenga tabaco, lo que incluye cigarrillos, tabaco de mascar y cigarrillos electrnicos. El tabaco puede retrasar el proceso de curacin. Si necesita ayuda para dejar de fumar, consulte al mdico.  Concurra a todas las visitas de control como se lo haya indicado el mdico. Esto es importante. PREVENCIN  Precaliente y elongue adecuadamente antes de la actividad.  Reljese y elongue despus de realizar una actividad.  Dele a su cuerpo tiempo para descansar entre los perodos de actividad.  Evite lo siguiente: ? Estar fsicamente inactivo durante perodos prolongados. ? Hacer ejercicio o practicar deportes cuando est cansado o dolorido.  Practique deportes y levante objetos pesados de la forma correcta.  Adopte una buena postura mientras est sentado y de pie.  Mantenga un peso saludable.  Duerma en un  colchn de firmeza media para apoyar la columna.  Asegrese de utilizar un equipo apto para usted, incluidos zapatos que calcen bien.  Tome medidas de seguridad y sea responsable al hacer una actividad, para evitar las cadas.  Haga por lo menos 150minutos de ejercicios de intensidad moderada cada semana, como caminar a paso ligero o hacer gimnasia acutica. Pruebe alguna forma de ejercicio que le quite tensin de la espalda, como nadar o utilizar la bicicleta fija.  Mantenga un buen estado fsico, esto incluye lo siguiente: ? La fuerza. ? La flexibilidad. ? La capacidad cardiovascular. ? La resistencia.  SOLICITE ATENCIN MDICA SI:  El dolor de espalda no mejora despus de 6semanas de tratamiento.  Los sntomas empeoran.  SOLICITE ATENCIN MDICA DE INMEDIATO SI:  El dolor de espalda es muy intenso.  Nota que no puede pararse o caminar.  Siente dolor en las piernas.  Siente debilidad en las nalgas o en las piernas.  Tiene problemas para controlar la miccin o los movimientos intestinales.  Esta informacin no tiene como fin reemplazar el consejo del mdico. Asegrese de hacerle al mdico cualquier pregunta que tenga. Document Released: 12/12/2005 Document Revised: 07/12/2014 Document Reviewed: 12/07/2014 Elsevier Interactive Patient Education  2017 [employer]  

## 2017-10-08 NOTE — Progress Notes (Signed)
BP 132/60 (BP Location: Left Arm, Patient Position: Sitting, Cuff Size: Normal)   Pulse 76   Temp 97.7 F (36.5 C)   Ht 4' 10.5" (1.486 m)   Wt 151 lb (68.5 kg)   SpO2 97%   BMI 31.02 kg/m    Subjective:    Patient ID: Janice Wells, female    DOB: 1960/09/27, 57 y.o.   MRN: [id number]  HPI: Janice Wells is a 57 y.o. female presenting on 10/08/2017 for Back Pain (constant pain since June. mid lower back. pt states it burns as if a pepper has been rubbed on her. pain radiates towards her Left side. ) and Leg Pain (bilateral lower leg pain. pt states painful when walking. began after back pain bega (June). no pain when sitting)   HPI   Chief Complaint  Patient presents with  . Back Pain    constant pain since June. mid lower back. pt states it burns as if a pepper has been rubbed on her. pain radiates towards her Left side.   . Leg Pain    bilateral lower leg pain. pt states painful when walking. began after back pain bega (June). no pain when sitting    Pain Started in mid-June.  Pt denies injury.  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  She says her back hurts when she is working.  She wears boots like that are plastic like rain boots for work.     she tried IBU for the pain but says it didn't help.     Relevant past medical, surgical, family and social history reviewed and updated as indicated. Interim medical history since our last visit reviewed. Allergies and medications reviewed and updated.  No current outpatient medications on file.   Review of Systems  Constitutional: Negative for appetite change, chills, diaphoresis, fatigue, fever and unexpected weight change.  HENT: Positive for dental problem and hearing loss. Negative for congestion, drooling, ear pain, facial swelling, mouth sores, sneezing, sore throat, trouble swallowing and voice change.   Eyes: Positive for itching. Negative for pain, discharge, redness and visual  disturbance.  Respiratory: Negative for cough, choking, shortness of breath and wheezing.   Cardiovascular: Negative for chest pain, palpitations and leg swelling.  Gastrointestinal: Positive for abdominal pain. Negative for blood in stool, constipation, diarrhea and vomiting.  Endocrine: Negative for cold intolerance, heat intolerance and polydipsia.  Genitourinary: Negative for decreased urine volume, dysuria and hematuria.  Musculoskeletal: Positive for back pain and gait problem. Negative for arthralgias.  Skin: Negative for rash.  Allergic/Immunologic: Negative for environmental allergies.  Neurological: Negative for seizures, syncope, light-headedness and headaches.  Hematological: Negative for adenopathy.  Psychiatric/Behavioral: Negative for agitation, dysphoric mood and suicidal ideas. The patient is not nervous/anxious.     Per HPI unless specifically indicated above     Objective:    BP 132/60 (BP Location: Left Arm, Patient Position: Sitting, Cuff Size: Normal)   Pulse 76   Temp 97.7 F (36.5 C)   Ht 4' 10.5" (1.486 m)   Wt 151 lb (68.5 kg)   SpO2 97%   BMI 31.02 kg/m   Wt Readings from Last 3 Encounters:  10/08/17 151 lb (68.5 kg)  01/14/17 152 lb (68.9 kg)  08/27/16 149 lb 8 oz (67.8 kg)    Physical Exam  Constitutional: She is oriented to person, place, and time. She appears well-developed and well-nourished.  HENT:  Head: Normocephalic and atraumatic.  Abdominal: Soft. Bowel sounds are normal.  She exhibits no distension and no mass. There is no tenderness. There is no rebound and no guarding.  Musculoskeletal: She exhibits no edema.       Lumbar back: She exhibits pain. She exhibits normal range of motion, no tenderness, no bony tenderness, no swelling and no edema.       Right lower leg: She exhibits tenderness. She exhibits no bony tenderness, no swelling and no edema.       Left lower leg: She exhibits tenderness. She exhibits no bony tenderness, no  swelling and no edema.  Negative SLR. Negative homan's  Neurological: She is alert and oriented to person, place, and time.  Skin: Skin is warm and dry.  Psychiatric: She has a normal mood and affect. Her behavior is normal.  Nursing note and vitals reviewed.       Assessment & Plan:    Encounter Diagnosis  Name Primary?  . Low back pain, unspecified back pain laterality, unspecified chronicity, with sciatica presence unspecified Yes    -rx prednisone taper and flexeril -heat/ice to area -pt given note to be out of work for 2 days -pt to RTO if falis to improve or new syptoms

## 2018-01-14 ENCOUNTER — Other Ambulatory Visit: Payer: Self-pay | Admitting: Physician Assistant

## 2018-01-14 ENCOUNTER — Other Ambulatory Visit (HOSPITAL_COMMUNITY)
Admission: RE | Admit: 2018-01-14 | Discharge: 2018-01-14 | Disposition: A | Discharge status: Home / Self Care | Payer: Self-pay | Source: Ambulatory Visit | Attending: Physician Assistant | Admitting: Physician Assistant

## 2018-01-14 ENCOUNTER — Ambulatory Visit: Payer: Self-pay | Admitting: Physician Assistant

## 2018-01-14 ENCOUNTER — Encounter: Payer: Self-pay | Admitting: Physician Assistant

## 2018-01-14 VITALS — BP 130/72 | HR 76 | Temp 97.7°F | Ht 58.5 in | Wt 152.5 lb

## 2018-01-14 DIAGNOSIS — E785 Hyperlipidemia, unspecified: Secondary | ICD-10-CM

## 2018-01-14 DIAGNOSIS — Z1211 Encounter for screening for malignant neoplasm of colon: Secondary | ICD-10-CM

## 2018-01-14 DIAGNOSIS — E669 Obesity, unspecified: Secondary | ICD-10-CM

## 2018-01-14 DIAGNOSIS — Z1239 Encounter for other screening for malignant neoplasm of breast: Secondary | ICD-10-CM

## 2018-01-14 LAB — COMPREHENSIVE METABOLIC PANEL
ALT: 19 U/L (ref 0–44)
AST: 22 U/L (ref 15–41)
Albumin: 3.9 g/dL (ref 3.5–5.0)
Alkaline Phosphatase: 101 U/L (ref 38–126)
Anion gap: 8 (ref 5–15)
BILIRUBIN TOTAL: 0.8 mg/dL (ref 0.3–1.2)
BUN: 11 mg/dL (ref 6–20)
CO2: 25 mmol/L (ref 22–32)
CREATININE: 0.61 mg/dL (ref 0.44–1.00)
Calcium: 8.8 mg/dL — ABNORMAL LOW (ref 8.9–10.3)
Chloride: 109 mmol/L (ref 98–111)
Glucose, Bld: 103 mg/dL — ABNORMAL HIGH (ref 70–99)
POTASSIUM: 3.7 mmol/L (ref 3.5–5.1)
Sodium: 142 mmol/L (ref 135–145)
TOTAL PROTEIN: 8.1 g/dL (ref 6.5–8.1)

## 2018-01-14 LAB — LIPID PANEL
CHOLESTEROL: 170 mg/dL (ref 0–200)
HDL: 36 mg/dL — AB (ref >40)
LDL CALC: 112 mg/dL — AB (ref 0–99)
TRIGLYCERIDES: 109 mg/dL (ref <150)
Total CHOL/HDL Ratio: 4.7 RATIO
VLDL: 22 mg/dL (ref 0–40)

## 2018-01-14 NOTE — Progress Notes (Signed)
BP 130/72 (BP Location: Left Arm, Patient Position: Sitting, Cuff Size: Normal)   Pulse 76   Temp 97.7 F (36.5 C)   Ht 4' 10.5" (1.486 m)   Wt 152 lb 8 oz (69.2 kg)   SpO2 97%   BMI 31.33 kg/m    Subjective:    Patient ID: Janice Wells, female    DOB: 05-25-1960, 57 y.o.   MRN: [id number]  HPI: Janice Wells is a 57 y.o. female presenting on 01/14/2018 for Follow-up   HPI  Pt states still a bit of back pain but improved from last OV.  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 Interim medical history since our last visit reviewed. Allergies and medications reviewed and updated.  No current outpatient medications on file.   Review of Systems  Constitutional: Negative for appetite change, chills, diaphoresis, fatigue, fever and unexpected weight change.  HENT: Negative for congestion, dental problem, drooling, ear pain, facial swelling, hearing loss, mouth sores, sneezing, sore throat, trouble swallowing and voice change.   Eyes: Positive for redness. Negative for pain, discharge, itching and visual disturbance.  Respiratory: Negative for cough, choking, shortness of breath and wheezing.   Cardiovascular: Negative for chest pain, palpitations and leg swelling.  Gastrointestinal: Positive for abdominal pain. Negative for blood in stool, constipation, diarrhea and vomiting.  Endocrine: Negative for cold intolerance, heat intolerance and polydipsia.  Genitourinary: Negative for decreased urine volume, dysuria and hematuria.  Musculoskeletal: Positive for back pain. Negative for arthralgias and gait problem.  Skin: Negative for rash.  Allergic/Immunologic: Negative for environmental allergies.  Neurological: Negative for seizures, syncope, light-headedness and headaches.  Hematological: Negative for adenopathy.   Psychiatric/Behavioral: Negative for agitation, dysphoric mood and suicidal ideas. The patient is not nervous/anxious.     Per HPI unless specifically indicated above     Objective:    BP 130/72 (BP Location: Left Arm, Patient Position: Sitting, Cuff Size: Normal)   Pulse 76   Temp 97.7 F (36.5 C)   Ht 4' 10.5" (1.486 m)   Wt 152 lb 8 oz (69.2 kg)   SpO2 97%   BMI 31.33 kg/m   Wt Readings from Last 3 Encounters:  01/14/18 152 lb 8 oz (69.2 kg)  10/08/17 151 lb (68.5 kg)  01/14/17 152 lb (68.9 kg)    Physical Exam  Constitutional: She is oriented to person, place, and time. She appears well-developed and well-nourished.  HENT:  Head: Normocephalic and atraumatic.  Neck: Neck supple.  Cardiovascular: Normal rate and regular rhythm.  Pulmonary/Chest: Effort normal and breath sounds normal.  Abdominal: Soft. Bowel sounds are normal. She exhibits no mass. There is no hepatosplenomegaly. There is no tenderness.  Musculoskeletal: She exhibits no edema.  Lymphadenopathy:    She has no cervical adenopathy.  Neurological: She is alert and oriented to person, place, and time.  Skin: Skin is warm and dry.  Psychiatric: She has a normal mood and affect. Her behavior is normal.  Vitals reviewed.       Assessment & Plan:    Encounter Diagnoses  Name Primary?  . Hyperlipidemia, unspecified hyperlipidemia type Yes  . Screening for breast cancer   . Special screening for malignant neoplasms, colon   . Obesity, unspecified classification, unspecified obesity type, unspecified whether serious comorbidity present     -pt is given iFOBT for colon cancer screening -ordered screening mammogram -  will Check lipids and pt will be called with results -pt to continue healthy lifestyle and follow up 1 year with PAP at that appointment.  RTO sooner prn

## 2018-01-15 LAB — IFOBT (OCCULT BLOOD): IMMUNOLOGICAL FECAL OCCULT BLOOD TEST: NEGATIVE

## 2018-03-25 ENCOUNTER — Other Ambulatory Visit (HOSPITAL_COMMUNITY): Payer: Self-pay | Admitting: *Deleted

## 2018-03-25 DIAGNOSIS — Z1231 Encounter for screening mammogram for malignant neoplasm of breast: Secondary | ICD-10-CM

## 2018-05-29 ENCOUNTER — Telehealth (HOSPITAL_COMMUNITY): Payer: Self-pay

## 2018-05-29 NOTE — Telephone Encounter (Signed)
Left message with patient about [facility] appointment that will need to be rescheduled due to [place]-[id number]. Left name and number for patient to call back. °

## 2018-06-04 ENCOUNTER — Ambulatory Visit (HOSPITAL_COMMUNITY): Payer: Self-pay

## 2018-08-12 ENCOUNTER — Other Ambulatory Visit: Payer: Self-pay

## 2018-08-12 ENCOUNTER — Encounter: Payer: Self-pay | Admitting: Physician Assistant

## 2018-08-12 ENCOUNTER — Ambulatory Visit: Payer: Self-pay | Admitting: Physician Assistant

## 2018-08-12 DIAGNOSIS — H1012 Acute atopic conjunctivitis, left eye: Secondary | ICD-10-CM

## 2018-08-12 DIAGNOSIS — G8929 Other chronic pain: Secondary | ICD-10-CM

## 2018-08-12 MED ORDER — IBUPROFEN 600 MG PO TABS: ORAL_TABLET | ORAL | 1 refills | Status: DC

## 2018-08-12 NOTE — Progress Notes (Signed)
   There were no vitals taken for this visit.   Subjective:    Patient ID: Janice Wells, female    DOB: 1960/12/30, 58 y.o.   MRN: [id number]  HPI: Janice Wells is a 57 y.o. female presenting on 08/12/2018 for No chief complaint on file.   HPI   This is a telemedicine appointment through [employer] due to coronavirus pandemic  I connected with  [name] on 08/12/18 by a video enabled telemedicine application and verified that I am speaking with the correct person using two identifiers.   I discussed the limitations of evaluation and management by telemedicine. The patient expressed understanding and agreed to proceed.  Pt is in her car.  Provider is in the office/clinic  Pt complains of left eye red since Sunday.  She does not wear corrective lenses.  She has had no changes in vision.  Pt has bump on L upper eyelid for years with no changes.   It does not bother her  Pt denies fever, cough, sob.  Pt c/o LBP.  No recent injury.   She was given exercises last year.   She says they help but she still has back pain intermittently.  She is not using any OTC medication to help the pain.  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   Relevant past medical, surgical, family and social history reviewed and updated as indicated. Interim medical history since our last visit reviewed. Allergies and medications reviewed and updated.  No current outpatient medications on file.    Review of Systems  Per HPI unless specifically indicated above     Objective:    There were no vitals taken for this visit.  Wt Readings from Last 3 Encounters:  01/14/18 152 lb 8 oz (69.2 kg)  10/08/17 151 lb (68.5 kg)  01/14/17 152 lb (68.9 kg)    Physical Exam Constitutional:      General: She is not in acute distress.    Appearance: Normal appearance. She is not ill-appearing.  HENT:     Head: Normocephalic and atraumatic.   Mouth/Throat:     Dentition: Abnormal dentition.     Comments: Poor dentition.  Many teeth missing Eyes:      Comments: Injection L cornea medial to iris.  No discharge.  R conjuctiva not red.   Pulmonary:     Effort: Pulmonary effort is normal. No respiratory distress.  Neurological:     Mental Status: She is alert.         Assessment & Plan:    Encounter Diagnoses  Name Primary?  . Allergic conjunctivitis of left eye Yes  . Chronic low back pain without sciatica, unspecified back pain laterality     -Pt instructed to use some zaditor OTC drops for her eye.   She is counseled to avoid rubbing it.    -Pt is given rx IBU to use as needed for LBP.  She is given reading information with instruction to apply ice/heat.  -Pt to follow up in November as scheduled.  She can contact office sooner prn

## 2018-08-12 NOTE — Patient Instructions (Signed)
Dolor de espalda crónico °Chronic Back Pain °Cuando el dolor de espalda dura más de 3 meses se denomina dolor de espalda crónico. Es posible que se desconozca la causa de su dolor de espalda. Algunas causas frecuentes son las siguientes: °· Desgaste (enfermedad degenerativa) de los huesos, los ligamentos o los discos de la espalda. °· Inflamación y rigidez en la espalda (artritis). °Las personas que sufren dolor de espalda crónico generalmente atraviesan determinados períodos en los que este es más intenso (episodios de exacerbación del dolor). Muchas personas pueden aprender a controlar el dolor de espalda con el cuidado en el hogar. °Sigue estas indicaciones en tu casa: °Esté atento a cualquier cambio en los síntomas. Tome estas medidas para aliviar el dolor: °Actividad ° °· Evite agacharse y realizar otras actividades que agraven el problema. °· Mantenga una postura correcta mientras está de pie o sentado: °? Cuando esté de pie, mantenga la parte alta de la espalda y el cuello rectos, con los hombros hacia atrás. Evite encorvarse. °? Cuando esté sentado, mantenga la espalda recta y relaje los hombros. No curve los hombros ni los eche hacia atrás. °· No permanezca sentado o de pie en el mismo lugar durante mucho tiempo. °· Durante el día, descanse durante lapsos breves. Esto le aliviará el dolor. Descansar recostado o de pie suele ser mejor que hacerlo sentado. °· Cuando descanse durante períodos más largos, incorpore alguna actividad suave o ejercicios de elongación entre uno y otro. Esto ayudará a evitar la rigidez y el dolor. °· Haga ejercicio con regularidad. Pregúntele al médico qué actividades son seguras para usted. °· No levante ningún objeto que pese más de 10 libras (4.5 kg). Siempre use las técnicas correctas para levantar objetos, entre ellas: °? Flexionar las rodillas. °? Mantener la carga cerca del cuerpo. °? No torcerse. °· Duerma sobre un colchón firme en una posición cómoda. Intente acostarse de  costado, con las rodillas ligeramente flexionadas. Si se recuesta sobre la espalda, coloque una almohada debajo de las rodillas. °Control del dolor °· Si se lo indican, aplique hielo sobre la zona dolorida. El médico puede recomendarle que se aplique hielo durante las primeras 24 a 48 horas después del comienzo de un episodio de exacerbación del dolor. °? Ponga el hielo en una bolsa plástica. °? Coloque una toalla entre la piel y la bolsa. °? Coloque el hielo durante 20 minutos, 2 a 3 veces al día. °· Si se lo indican, aplique calor en la zona afectada con la frecuencia que le haya indicado el médico. Use la fuente de calor que el médico le recomiende, como una compresa de calor húmedo o una almohadilla térmica. °? Colóquese una toalla entre la piel y la fuente de calor. °? Aplique calor durante 20 a 30 minutos. °? Retire la fuente de calor si la piel se pone de color rojo brillante. Esto es especialmente importante si no puede sentir dolor, calor o frío. Puede correr un riesgo mayor de sufrir quemaduras. °· Intente tomar un baño de inmersión con agua caliente. °· Toma los medicamentos de venta libre y los recetados solamente como se lo haya indicado el médico. °· Concurrir a todas las visitas de seguimiento como se lo haya indicado el médico. Esto es importante. °Comunícate con un médico si: °· Siente un dolor que no se alivia con reposo o medicamentos. °Solicite ayuda inmediatamente si: °· Siente debilidad o adormecimiento en una o ambas piernas, o en uno o ambos pies. °· Tiene dificultad para controlar la micción o la defecación. °· Tiene náuseas o vómitos. °· Tiene dolor   en el abdomen. °· Le falta el aire o se desmaya. °Esta información no tiene como fin reemplazar el consejo del médico. Asegúrese de hacerle al médico cualquier pregunta que tenga. °Document Released: 02/25/2005 Document Revised: 10/03/2017 Document Reviewed: 12/23/2016 °Elsevier Interactive Patient Education © 2019 Elsevier Inc. ° °

## 2018-10-06 ENCOUNTER — Ambulatory Visit (HOSPITAL_COMMUNITY): Payer: Self-pay

## 2018-12-10 ENCOUNTER — Other Ambulatory Visit: Payer: Self-pay | Admitting: Physician Assistant

## 2018-12-10 DIAGNOSIS — E785 Hyperlipidemia, unspecified: Secondary | ICD-10-CM

## 2018-12-21 ENCOUNTER — Other Ambulatory Visit (HOSPITAL_COMMUNITY)
Admission: RE | Admit: 2018-12-21 | Discharge: 2018-12-21 | Disposition: A | Discharge status: Home / Self Care | Payer: Self-pay | Source: Ambulatory Visit | Attending: Physician Assistant | Admitting: Physician Assistant

## 2018-12-21 DIAGNOSIS — E785 Hyperlipidemia, unspecified: Secondary | ICD-10-CM | POA: Insufficient documentation

## 2018-12-21 LAB — COMPREHENSIVE METABOLIC PANEL
ALT: 18 U/L (ref 0–44)
AST: 19 U/L (ref 15–41)
Albumin: 3.8 g/dL (ref 3.5–5.0)
Alkaline Phosphatase: 107 U/L (ref 38–126)
Anion gap: 9 (ref 5–15)
BUN: 19 mg/dL (ref 6–20)
CO2: 24 mmol/L (ref 22–32)
Calcium: 9 mg/dL (ref 8.9–10.3)
Chloride: 107 mmol/L (ref 98–111)
Creatinine, Ser: 0.56 mg/dL (ref 0.44–1.00)
GFR calc Af Amer: >60 mL/min (ref >60)
GFR calc non Af Amer: >60 mL/min (ref >60)
Glucose, Bld: 107 mg/dL — ABNORMAL HIGH (ref 70–99)
Potassium: 3.7 mmol/L (ref 3.5–5.1)
Sodium: 140 mmol/L (ref 135–145)
Total Bilirubin: 0.3 mg/dL (ref 0.3–1.2)
Total Protein: 8 g/dL (ref 6.5–8.1)

## 2018-12-21 LAB — LIPID PANEL
Cholesterol: 161 mg/dL (ref 0–200)
HDL: 37 mg/dL — ABNORMAL LOW (ref >40)
LDL Cholesterol: 104 mg/dL — ABNORMAL HIGH (ref 0–99)
Total CHOL/HDL Ratio: 4.4 RATIO
Triglycerides: 100 mg/dL (ref <150)
VLDL: 20 mg/dL (ref 0–40)

## 2018-12-23 ENCOUNTER — Other Ambulatory Visit (HOSPITAL_COMMUNITY)
Admission: RE | Admit: 2018-12-23 | Discharge: 2018-12-23 | Disposition: A | Discharge status: Home / Self Care | Payer: Self-pay | Source: Ambulatory Visit | Attending: Physician Assistant | Admitting: Physician Assistant

## 2018-12-23 ENCOUNTER — Other Ambulatory Visit: Payer: Self-pay

## 2018-12-23 ENCOUNTER — Ambulatory Visit: Payer: Self-pay | Admitting: Physician Assistant

## 2018-12-23 ENCOUNTER — Encounter: Payer: Self-pay | Admitting: Physician Assistant

## 2018-12-23 VITALS — BP 120/80 | HR 67 | Temp 98.2°F

## 2018-12-23 DIAGNOSIS — Z0000 Encounter for general adult medical examination without abnormal findings: Secondary | ICD-10-CM

## 2018-12-23 DIAGNOSIS — Z124 Encounter for screening for malignant neoplasm of cervix: Secondary | ICD-10-CM | POA: Insufficient documentation

## 2018-12-23 DIAGNOSIS — Z1231 Encounter for screening mammogram for malignant neoplasm of breast: Secondary | ICD-10-CM

## 2018-12-23 DIAGNOSIS — Z1211 Encounter for screening for malignant neoplasm of colon: Secondary | ICD-10-CM

## 2018-12-23 DIAGNOSIS — Z789 Other specified health status: Secondary | ICD-10-CM

## 2018-12-23 NOTE — Progress Notes (Signed)
BP 120/80   Pulse 67   Temp 98.2 F (36.8 C)   LMP 03/11/2008 (Approximate)   SpO2 97%    Subjective:    Patient ID: Janice Wells, female    DOB: [birth year], 58 y.o.   MRN: [id number]  HPI: Janice Wells is a 58 y.o. female presenting on 12/23/2018 for Hyperlipidemia and Gynecologic Exam   HPI   Pt had a negative covid 19 screening questionnaire  Pt is doing well and has no complaints   Relevant past medical, surgical, family and social history reviewed and updated as indicated. Interim medical history since our last visit reviewed. Allergies and medications reviewed and updated.    Current Outpatient Medications:  .  ibuprofen (ADVIL) 600 MG tablet, 1 po q 8 hour prn pain.  1 tableta por boca cada 8 horas cuando sea necesario para el dolor, Disp: 60 tablet, Rfl: 1   Review of Systems  Per HPI unless specifically indicated above     Objective:    BP 120/80   Pulse 67   Temp 98.2 F (36.8 C)   LMP 03/11/2008 (Approximate)   SpO2 97%   Wt Readings from Last 3 Encounters:  01/14/18 152 lb 8 oz (69.2 kg)  10/08/17 151 lb (68.5 kg)  01/14/17 152 lb (68.9 kg)    Physical Exam Vitals signs and nursing note reviewed.  Constitutional:      General: She is not in acute distress.    Appearance: Normal appearance. She is well-developed. She is not ill-appearing.  HENT:     Head: Normocephalic and atraumatic.  Neck:     Musculoskeletal: Neck supple.  Cardiovascular:     Rate and Rhythm: Normal rate and regular rhythm.  Pulmonary:     Effort: Pulmonary effort is normal.     Breath sounds: Normal breath sounds.  Abdominal:     General: Bowel sounds are normal.     Palpations: Abdomen is soft. There is no mass.     Tenderness: There is no abdominal tenderness. There is no guarding or rebound.  Genitourinary:    Labia:        Right: No rash, tenderness or lesion.        Left: No rash, tenderness or lesion.      Vagina: Normal.   Cervix: No cervical motion tenderness, discharge or friability.     Adnexa:        Right: No mass, tenderness or fullness.         Left: No mass, tenderness or fullness.       Comments: (nurse Berenice assisted)  Musculoskeletal:     Right lower leg: No edema.     Left lower leg: No edema.  Lymphadenopathy:     Cervical: No cervical adenopathy.  Skin:    General: Skin is warm and dry.  Neurological:     Mental Status: She is alert and oriented to person, place, and time.  Psychiatric:        Attention and Perception: Attention normal.        Speech: Speech normal.        Behavior: Behavior normal. Behavior is cooperative.     Results for orders placed or performed during the hospital encounter of 12/21/18  Lipid panel  Result Value Ref Range   Cholesterol 161 0 - 200 mg/dL   Triglycerides 100 <150 mg/dL   HDL 37 (L) >40 mg/dL   Total CHOL/HDL Ratio 4.4 RATIO   VLDL 20 0 - 40  mg/dL   LDL Cholesterol 643 (H) 0 - 99 mg/dL  Comprehensive metabolic panel  Result Value Ref Range   Sodium 140 135 - 145 mmol/L   Potassium 3.7 3.5 - 5.1 mmol/L   Chloride 107 98 - 111 mmol/L   CO2 24 22 - 32 mmol/L   Glucose, Bld 107 (H) 70 - 99 mg/dL   BUN 19 6 - 20 mg/dL   Creatinine, Ser 3.29 0.44 - 1.00 mg/dL   Calcium 9.0 8.9 - 51.8 mg/dL   Total Protein 8.0 6.5 - 8.1 g/dL   Albumin 3.8 3.5 - 5.0 g/dL   AST 19 15 - 41 U/L   ALT 18 0 - 44 U/L   Alkaline Phosphatase 107 38 - 126 U/L   Total Bilirubin 0.3 0.3 - 1.2 mg/dL   GFR calc non Af Amer >60 >60 mL/min   GFR calc Af Amer >60 >60 mL/min   Anion gap 9 5 - 15      Assessment & Plan:   Encounter Diagnoses  Name Primary?  . Routine Papanicolaou smear Yes  . Well adult exam   . Encounter for screening mammogram for malignant neoplasm of breast   . Special screening for malignant neoplasms, colon   . Not proficient in [place] language      -reviewed labs with pt  -pt was given ifobt for colon cancer screening -pt had inFluenza  vaccination already done -will refer for screening Mammogram -pt to follow up 1 year.  She is to contact office sooner prn

## 2018-12-24 ENCOUNTER — Other Ambulatory Visit: Payer: Self-pay | Admitting: Physician Assistant

## 2018-12-24 DIAGNOSIS — Z1211 Encounter for screening for malignant neoplasm of colon: Secondary | ICD-10-CM

## 2018-12-24 LAB — IFOBT (OCCULT BLOOD): IFOBT: NEGATIVE

## 2018-12-31 LAB — CYTOLOGY - PAP
Comment: NEGATIVE
Diagnosis: NEGATIVE
High risk HPV: NEGATIVE

## 2019-01-18 ENCOUNTER — Ambulatory Visit: Payer: Self-pay | Admitting: Physician Assistant

## 2019-01-27 ENCOUNTER — Other Ambulatory Visit: Payer: Self-pay | Admitting: Physician Assistant

## 2019-01-27 DIAGNOSIS — Z1231 Encounter for screening mammogram for malignant neoplasm of breast: Secondary | ICD-10-CM

## 2019-02-10 ENCOUNTER — Encounter (HOSPITAL_COMMUNITY): Payer: Self-pay

## 2019-08-31 ENCOUNTER — Other Ambulatory Visit: Payer: Self-pay

## 2019-08-31 ENCOUNTER — Ambulatory Visit: Payer: Self-pay | Admitting: Physician Assistant

## 2019-08-31 ENCOUNTER — Encounter: Payer: Self-pay | Admitting: Physician Assistant

## 2019-08-31 VITALS — BP 120/70 | HR 66 | Temp 97.7°F | Ht 58.5 in | Wt 146.8 lb

## 2019-08-31 DIAGNOSIS — K219 Gastro-esophageal reflux disease without esophagitis: Secondary | ICD-10-CM

## 2019-08-31 DIAGNOSIS — H6092 Unspecified otitis externa, left ear: Secondary | ICD-10-CM

## 2019-08-31 DIAGNOSIS — Z789 Other specified health status: Secondary | ICD-10-CM

## 2019-08-31 MED ORDER — OFLOXACIN 0.3 % OT SOLN: OTIC | 0 refills | Status: DC

## 2019-08-31 MED ORDER — OMEPRAZOLE 40 MG PO CPDR: DELAYED_RELEASE_CAPSULE | ORAL | 3 refills | Status: DC

## 2019-08-31 NOTE — Progress Notes (Signed)
BP 120/70   Pulse 66   Temp 97.7 F (36.5 C)   Ht 4' 10.5" (1.486 m)   Wt 146 lb 12 oz (66.6 kg)   LMP 03/11/2008 (Approximate)   SpO2 96%   BMI 30.15 kg/m    Subjective:    Patient ID: Janice Wells, female    DOB: 1960-07-26, 59 y.o.   MRN: [id number]  HPI: Janice Wells is a 59 y.o. female presenting on 08/31/2019 for Ear Pain (started about 2 weeks ago it hurt for 2 days, but is no longer hurting. pt states she does feel a lot of drainage from her L ear. pt states she feels her L ear is clogged and has trouble hearing at times) and Abdominal Pain (pt has bad abd pain yesterday and had to get off work. pt states it also hurt last week. the last time it hurt before was in March. pt states when her stomach hurts she feels very bloated)   HPI    Pt had a negative covid 19 screening questionnaire.   Ear problem and abd pain as above.   Last BM yesterday mroning and it was normal and no blood.  No nausea or emesis.  Pain comes and goes.  Eating makes pain better.   She took nothing except tea to try to help the abd pain.    She has had both doses of covid vaccination     Relevant past medical, surgical, family and social history reviewed and updated as indicated. Interim medical history since our last visit reviewed. Allergies and medications reviewed and updated.  CURRENT MEDS: none  Review of Systems  Per HPI unless specifically indicated above     Objective:    BP 120/70   Pulse 66   Temp 97.7 F (36.5 C)   Ht 4' 10.5" (1.486 m)   Wt 146 lb 12 oz (66.6 kg)   LMP 03/11/2008 (Approximate)   SpO2 96%   BMI 30.15 kg/m   Wt Readings from Last 3 Encounters:  08/31/19 146 lb 12 oz (66.6 kg)  01/14/18 152 lb 8 oz (69.2 kg)  10/08/17 151 lb (68.5 kg)    Physical Exam Vitals reviewed.  Constitutional:      General: She is not in acute distress.    Appearance: She is well-developed. She is not toxic-appearing.  HENT:     Head:  Normocephalic and atraumatic.     Right Ear: Tympanic membrane, ear canal and external ear normal.     Left Ear: Drainage and tenderness present. No swelling.     Ears:     Comments: Unable to visualize L TM due to discharge Cardiovascular:     Rate and Rhythm: Normal rate and regular rhythm.  Pulmonary:     Effort: Pulmonary effort is normal.     Breath sounds: Normal breath sounds.  Abdominal:     General: Bowel sounds are normal.     Palpations: Abdomen is soft. There is no mass.     Tenderness: There is no abdominal tenderness. There is no guarding or rebound.  Musculoskeletal:     Cervical back: Neck supple.     Right lower leg: No edema.     Left lower leg: No edema.  Lymphadenopathy:     Cervical: No cervical adenopathy.  Skin:    General: Skin is warm and dry.  Neurological:     Mental Status: She is alert and oriented to person, place, and time.  Psychiatric:  Behavior: Behavior normal. Behavior is cooperative.           Assessment & Plan:    Encounter Diagnoses  Name Primary?  . Gastroesophageal reflux disease, unspecified whether esophagitis present Yes  . Otitis externa of left ear, unspecified chronicity, unspecified type   . Not proficient in English language       -rx Omeprazole.  Counseled to avoid spicy foods and greasy foods -rx Ear drops -F/u 1 month to recheck abd pain and discuss back pain (which she mentioned on the way out the door).  She is to contact office sooner prn worsening or new symptoms.

## 2019-09-29 ENCOUNTER — Ambulatory Visit: Payer: Self-pay | Admitting: Physician Assistant

## 2019-09-29 ENCOUNTER — Other Ambulatory Visit: Payer: Self-pay

## 2019-09-29 ENCOUNTER — Encounter: Payer: Self-pay | Admitting: Physician Assistant

## 2019-09-29 VITALS — BP 120/72 | HR 77 | Temp 98.1°F | Ht 58.5 in | Wt 146.3 lb

## 2019-09-29 DIAGNOSIS — Z789 Other specified health status: Secondary | ICD-10-CM

## 2019-09-29 DIAGNOSIS — G8929 Other chronic pain: Secondary | ICD-10-CM

## 2019-09-29 DIAGNOSIS — Z1239 Encounter for other screening for malignant neoplasm of breast: Secondary | ICD-10-CM

## 2019-09-29 DIAGNOSIS — K219 Gastro-esophageal reflux disease without esophagitis: Secondary | ICD-10-CM

## 2019-09-29 DIAGNOSIS — H6122 Impacted cerumen, left ear: Secondary | ICD-10-CM

## 2019-09-29 MED ORDER — OMEPRAZOLE 40 MG PO CPDR: DELAYED_RELEASE_CAPSULE | ORAL | 3 refills | Status: DC

## 2019-09-29 NOTE — Patient Instructions (Addendum)
Bacitracin ointment for bump on back  pumada bacitracin para el granito en la espalda   -------------------------------------    Ejercicios para la espalda Back Exercises Estos ejercicios ayudan a fortalecer el tronco y la espalda. adems, ayudan a mantener la flexibilidad de la zona lumbar. Hacer estos ejercicios puede ser de ayuda para evitar o Engineer, materials de espalda.  Si tiene dolor de espalda, trate de Museum/gallery exhibitions officer ejercicios 2 o 3veces por da, o como se lo haya indicado el mdico.  A medida que [place], haga los ejercicios una vez por da. Repita los ejercicios con ms frecuencia como se lo haya indicado el mdico.  Para evitar que el dolor regrese, haga los ejercicios una vez por da o como se lo haya indicado el mdico. Ejercicios Rodilla al pecho Repita estos pasos 3 o 5veces seguidas con cada pierna: 1. Acustese boca arriba sobre una cama dura o sobre el suelo con las piernas extendidas. 2. Lleve una rodilla al pecho. 3. Agarre la rodilla o el muslo con ambas manos y sostngalo en su lugar. 4. Tire de la rodilla hasta sentir una elongacin suave en la parte baja de la espalda o las nalgas. 5. Mantenga la elongacin durante 10 a 30segundos. 6. Suelte y extienda la pierna lentamente. Inclinacin de la pelvis Repita estos pasos 5 o 10veces seguidas: 1. Acustese boca arriba sobre una cama dura o sobre el suelo con las piernas extendidas. 2. Flexione las rodillas de manera que apunten al techo. Los pies deben estar apoyados en el suelo. 3. Contraiga los msculos de la parte baja del vientre (abdomen) para empujar la zona lumbar contra el suelo. Este movimiento har que el coxis apunte hacia el techo, en lugar de apuntar hacia abajo en direccin a los pies o al suelo. 4. Mantenga esta posicin durante 5 a 10segundos mientras contrae suavemente los msculos y respira con normalidad. El perro y el gato Repita estos pasos hasta que la zona lumbar se curve con ms  facilidad: 1. Apoye las palmas de las manos y las rodillas sobre una superficie firme. Las manos deben estar alineadas con los hombros y las rodillas con las caderas. Puede colocarse almohadillas debajo de las rodillas. 2. Deje que la cabeza cuelgue hacia el pecho. Tense (contraiga) los msculos del vientre. Baje el coxis en direccin al suelo de modo que la zona lumbar se arquee como el lomo de un [place]. 3. Mantenga esta posicin durante 5segundos. 4. Levante lentamente la cabeza. Relaje los msculos del vientre. Eleve el coxis de modo que apunte en direccin al techo para que la espalda forme un arco hundido como el lomo de un perro contento. 5. Mantenga esta posicin durante 5segundos.  Flexiones de brazos Repita estos pasos 5 o 10veces seguidas: 1. Acustese boca abajo en el suelo. 2. Ponga las manos cerca de la cabeza, separadas aproximadamente al ancho de los hombros. 3. Con la espalda relajada y las caderas apoyadas en el suelo, extienda lentamente los brazos para levantar la mitad superior del cuerpo y Optometrist los hombros. No use los msculos de la espalda. Puede cambiar la ubicacin de las manos para estar ms cmodo. 4. Mantenga esta posicin durante 5segundos. 5. Lentamente vuelva a la posicin horizontal.  Puentes Repita estos pasos 10veces seguidas: 1. Acustese boca arriba sobre una superficie firme. 2. Flexione las rodillas de manera que apunten al techo. Los pies deben estar apoyados en el suelo. Los brazos deben estar paralelos a los costados del cuerpo, cerca del cuerpo.  3. Contraiga los glteos y despegue las nalgas del suelo hasta que la cintura est casi a la altura de las rodillas. Si no siente el trabajo muscular en las nalgas y la parte posterior de los muslos, aleje los pies 1 o 2pulgadas (2.5 o 5centmetros) de las nalgas. 4. Mantenga esta posicin durante 3 a 5segundos. 5. Lentamente, vuelva a apoyar las nalgas en el suelo y relaje los glteos. Si este  ejercicio le resulta muy fcil, intente realizarlo con los brazos cruzados [place]. Abdominales Repita estos pasos 5 o 10veces seguidas: 1. Acustese boca arriba sobre una cama dura o sobre el suelo con las piernas extendidas. 2. Flexione las rodillas de manera que apunten al techo. Los pies deben estar apoyados en el suelo. 3. Cruce los [employer]. 4. Baje levemente el mentn en direccin al pecho, pero no doble el cuello. 5. Contraiga los msculos del abdomen y con lentitud eleve el pecho lo suficiente como para despegar levemente los omplatos del suelo. Evite levantar el cuerpo ms alto que eso, porque puede sobreexigir la zona lumbar. 6. Lentamente baje el pecho y la cabeza hasta el suelo. Elevaciones de espalda Repita estos pasos 5 o 10veces seguidas: 1. Acustese boca abajo con los brazos a los costados y apoye la frente en el suelo. 2. Contraiga los msculos de las piernas y los glteos. 3. Lentamente despegue el pecho del suelo mientras mantiene las caderas apoyadas en el suelo. Mantenga la nuca alineada con la curvatura de la espalda. Mire hacia el suelo mientras hace este ejercicio. 4. Mantenga esta posicin durante 3 a 5segundos. 5. Lentamente baje el pecho y el rostro hasta el suelo. Comunquese con un mdico si:  El dolor de espalda se vuelve mucho ms intenso cuando hace un ejercicio.  El dolor de espalda no mejora 2horas despus de [employer] ejercicios. Si tiene alguno de [employer], deje de [employer] ejercicios. No vuelva a hacer los ejercicios a menos que el mdico lo autorice. Solicite ayuda inmediatamente si:  Siente un dolor sbito y muy intenso en la espalda. Si esto ocurre, deje de [employer]. No vuelva a hacer los ejercicios a menos que el mdico lo autorice. Esta informacin no tiene Theme park manager el consejo del mdico. Asegrese de hacerle al mdico cualquier pregunta que tenga. Document Revised: 12/25/2017 Document  Reviewed: 12/25/2017 Elsevier Patient Education  2020 [employer].

## 2019-09-29 NOTE — Progress Notes (Signed)
'     BP 120/72    Pulse 77    Temp 98.1 F (36.7 C)    Ht 4' 10.5" (1.486 m)    Wt 146 lb 4.8 oz (66.4 kg)    LMP 03/11/2008 (Approximate)    SpO2 98%    BMI 30.06 kg/m    Subjective:    Patient ID: Janice Wells, female    DOB: 1961/01/30, 59 y.o.   MRN: [id number]  HPI: Janice Wells is a 59 y.o. female presenting on 09/29/2019 for Abdominal Pain and Back Pain   HPI    Pt had a negative covid 19 screening questionnaire.     Pt is 59yoF who presents for follow up of abdominal pain and back pain.  -She says her Ears better  -her abd improved.  She says Urinating makes pain go away.  No N/V/D.  BMs normal and regular.   -Back pain in middle.  Feels like she has a little bump.  Started hurting about a year ago. worse lately.  Hurts and burns.  Dragon ointment was tried - didn't work.. IBU helps a little bit.  Bending over hurts.  No LE numbness or tingingling or radiation of pain into the legs  -She is mostly taking IBU for stomach pain     Relevant past medical, surgical, family and social history reviewed and updated as indicated. Interim medical history since our last visit reviewed. Allergies and medications reviewed and updated.    Current Outpatient Medications:    ibuprofen (ADVIL) 600 MG tablet, 1 po q 8 hour prn pain.  1 tableta por boca cada 8 horas cuando sea necesario para el dolor, Disp: 60 tablet, Rfl: 1   omeprazole (PRILOSEC) 40 MG capsule, 1 po qd.  Tome una tableta por boca diaria, Disp: 30 capsule, Rfl: 3     Review of Systems  Per HPI unless specifically indicated above     Objective:    BP 120/72    Pulse 77    Temp 98.1 F (36.7 C)    Ht 4' 10.5" (1.486 m)    Wt 146 lb 4.8 oz (66.4 kg)    LMP 03/11/2008 (Approximate)    SpO2 98%    BMI 30.06 kg/m   Wt Readings from Last 3 Encounters:  09/29/19 146 lb 4.8 oz (66.4 kg)  08/31/19 146 lb 12 oz (66.6 kg)  01/14/18 152 lb 8 oz (69.2 kg)    Physical Exam Vitals  reviewed.  Constitutional:      Appearance: She is well-developed.  HENT:     Head: Normocephalic and atraumatic.     Right Ear: There is no impacted cerumen.     Left Ear: There is impacted cerumen.  Cardiovascular:     Rate and Rhythm: Normal rate and regular rhythm.  Pulmonary:     Effort: Pulmonary effort is normal.     Breath sounds: Normal breath sounds.  Abdominal:     General: Bowel sounds are normal.     Palpations: Abdomen is soft. There is no hepatomegaly, splenomegaly, mass or pulsatile mass.     Tenderness: There is no abdominal tenderness. There is no guarding or rebound.     Comments: Pt reports discomfort on palpation diffusely but no tenderness.  Musculoskeletal:     Cervical back: Neck supple.  Lymphadenopathy:     Cervical: No cervical adenopathy.  Skin:    General: Skin is warm and dry.  Neurological:     Mental Status: She is  alert and oriented to person, place, and time.  Psychiatric:        Behavior: Behavior normal.           Assessment & Plan:   Encounter Diagnoses  Name Primary?   Gastroesophageal reflux disease, unspecified whether esophagitis present Yes   Not proficient in English language    Chronic low back pain without sciatica, unspecified back pain laterality    Impacted cerumen, left ear    Encounter for screening for malignant neoplasm of breast, unspecified screening modality       -Stop IBU for abd pain -continue omeprazole -Reorder mammogram -heat/ice prn back pain.  Counseled and gave exercises to strengthen the back.  -f/u 3 months to recheck abd pain.  Contact office sooner prn

## 2019-09-30 ENCOUNTER — Other Ambulatory Visit: Payer: Self-pay | Admitting: Student

## 2019-09-30 DIAGNOSIS — Z1239 Encounter for other screening for malignant neoplasm of breast: Secondary | ICD-10-CM

## 2019-10-14 ENCOUNTER — Ambulatory Visit (HOSPITAL_COMMUNITY): Payer: Self-pay

## 2019-10-25 ENCOUNTER — Emergency Department (HOSPITAL_COMMUNITY)
Admission: EM | Admit: 2019-10-25 | Discharge: 2019-10-26 | Disposition: A | Discharge status: Home / Self Care | Payer: Self-pay | Attending: Emergency Medicine | Admitting: Emergency Medicine

## 2019-10-25 ENCOUNTER — Encounter (HOSPITAL_COMMUNITY): Payer: Self-pay | Admitting: *Deleted

## 2019-10-25 ENCOUNTER — Emergency Department (HOSPITAL_COMMUNITY): Payer: Self-pay

## 2019-10-25 DIAGNOSIS — M19012 Primary osteoarthritis, left shoulder: Secondary | ICD-10-CM | POA: Insufficient documentation

## 2019-10-25 DIAGNOSIS — M12812 Other specific arthropathies, not elsewhere classified, left shoulder: Secondary | ICD-10-CM

## 2019-10-25 DIAGNOSIS — M7542 Impingement syndrome of left shoulder: Secondary | ICD-10-CM | POA: Insufficient documentation

## 2019-10-25 DIAGNOSIS — R52 Pain, unspecified: Secondary | ICD-10-CM

## 2019-10-25 MED ORDER — IBUPROFEN 400 MG PO TABS
600.0000 mg | ORAL_TABLET | Freq: Once | ORAL | Status: AC
  Administered 2019-10-25: 600 mg via ORAL
  Filled 2019-10-25: qty 2

## 2019-10-25 MED ORDER — HYDROCODONE-ACETAMINOPHEN 5-325 MG PO TABS
1.0000 | ORAL_TABLET | Freq: Once | ORAL | Status: AC
  Administered 2019-10-25: 1 via ORAL
  Filled 2019-10-25: qty 1

## 2019-10-25 MED ORDER — IBUPROFEN 600 MG PO TABS: 600.0000 mg | ORAL_TABLET | Freq: Three times a day (TID) | ORAL | 0 refills | Status: DC

## 2019-10-25 MED ORDER — OXYCODONE-ACETAMINOPHEN 5-325 MG PO TABS
1.0000 | ORAL_TABLET | ORAL | Status: DC | PRN
  Administered 2019-10-25: 1 via ORAL
  Filled 2019-10-25: qty 1

## 2019-10-25 NOTE — ED Provider Notes (Signed)
[facility] EMERGENCY DEPARTMENT Provider Note   CSN: [id number] Arrival date & time: 10/25/19  1443     History Chief Complaint  Patient presents with  . Shoulder Pain    Janice Wells is a 59 y.o. female.  HPI     59 year old female comes in a chief complaint of shoulder pain. Patient reports that 2 weeks ago she started having pain in her left shoulder while she was at work, lifting.  She states that she was moving a heavy box when the pain started.  Since then she has had worsening discomfort, especially with any kind of movement.  History reviewed. No pertinent past medical history.  Patient Active Problem List   Diagnosis Date Noted  . Obesity, unspecified 02/15/2015    History reviewed. No pertinent surgical history.   OB History    Gravida  0   Para  0   Term  0   Preterm  0   AB  0   Living        SAB  0   TAB  0   Ectopic  0   Multiple      Live Births              Family History  Problem Relation Age of Onset  . Diabetes Sister   . Cancer Brother     Social History   Tobacco Use  . Smoking status: Never Smoker  . Smokeless tobacco: Never Used  Substance Use Topics  . Alcohol use: No  . Drug use: No    Home Medications Prior to Admission medications   Medication Sig Start Date End Date Taking? Authorizing Provider  ibuprofen (ADVIL) 600 MG tablet Take 1 tablet (600 mg total) by mouth 3 (three) times Wells. 10/25/19   Derwood Kaplan, MD  omeprazole (PRILOSEC) 40 MG capsule 1 po qd.  Tome una tableta por boca diaria 09/29/19   Jacquelin Hawking, PA-C    Allergies    Patient has no known allergies.  Review of Systems   Review of Systems  Constitutional: Positive for activity change.  Musculoskeletal: Positive for arthralgias.    Physical Exam Updated Vital Signs BP (!) 133/99 (BP Location: Right Arm)   Pulse 79   Temp 98 F (36.7 C) (Oral)   Resp 18   Ht 5' (1.524 m)   Wt 66.7 kg   LMP 03/11/2008  (Approximate)   SpO2 99%   BMI 28.71 kg/m   Physical Exam Vitals and nursing note reviewed.  Constitutional:      Appearance: She is well-developed.  HENT:     Head: Normocephalic and atraumatic.  Cardiovascular:     Rate and Rhythm: Normal rate.  Pulmonary:     Effort: Pulmonary effort is normal.  Abdominal:     General: Bowel sounds are normal.  Musculoskeletal:        General: Tenderness present. No swelling.     Cervical back: Normal range of motion and neck supple.     Comments: Patient has tenderness with abduction, forward flexion and internal plus external rotation of the left shoulder.  Skin:    General: Skin is warm and dry.  Neurological:     Mental Status: She is alert and oriented to person, place, and time.     ED Results / Procedures / Treatments   Labs (all labs ordered are listed, but only abnormal results are displayed) Labs Reviewed - No data to display  EKG None  Radiology DG Shoulder  Left  Result Date: 10/25/2019 CLINICAL DATA:  Left shoulder pain after fall EXAM: LEFT SHOULDER - 2+ VIEW COMPARISON:  None. FINDINGS: Frontal and transscapular views of the left shoulder are obtained. No fracture, subluxation, or dislocation. Joint spaces are well preserved. Left chest is clear. IMPRESSION: 1. Unremarkable left shoulder. Electronically Signed   By: Sharlet Salina M.D.   On: 10/25/2019 18:13    Procedures Procedures (including critical care time)  Medications Ordered in ED Medications  oxyCODONE-acetaminophen (PERCOCET/ROXICET) 5-325 MG per tablet 1 tablet (1 tablet Oral Given 10/25/19 1709)  ibuprofen (ADVIL) tablet 600 mg (600 mg Oral Given 10/25/19 2240)  HYDROcodone-acetaminophen (NORCO/VICODIN) 5-325 MG per tablet 1 tablet (1 tablet Oral Given 10/25/19 2241)    ED Course  I have reviewed the triage vital signs and the nursing notes.  Pertinent labs & imaging results that were available during my care of the patient were reviewed by me and  considered in my medical decision making (see chart for details).    MDM Rules/Calculators/A&P                          59 year old comes in a chief complaint of shoulder pain.  Based on exam it appears that she likely has rotator cuff tendinopathy.  Orthopedic follow-up will be provided with recommendations of rice.  Final Clinical Impression(s) / ED Diagnoses Final diagnoses:  Pain  Shoulder impingement syndrome, left  Rotator cuff arthropathy, left    Rx / DC Orders ED Discharge Orders         Ordered    ibuprofen (ADVIL) 600 MG tablet  3 times Wells     Discontinue  Reprint     10/25/19 0175           Derwood Kaplan, MD 10/25/19 2351

## 2019-10-25 NOTE — ED Triage Notes (Signed)
Left shoulder pain.

## 2019-12-22 ENCOUNTER — Ambulatory Visit: Payer: Self-pay | Admitting: Physician Assistant

## 2019-12-29 ENCOUNTER — Ambulatory Visit: Payer: Self-pay | Admitting: Physician Assistant

## 2020-01-06 ENCOUNTER — Ambulatory Visit: Payer: Self-pay | Admitting: Physician Assistant

## 2020-01-06 ENCOUNTER — Encounter: Payer: Self-pay | Admitting: Physician Assistant

## 2020-01-06 VITALS — BP 122/76 | HR 73 | Temp 97.7°F | Ht 58.5 in | Wt 154.0 lb

## 2020-01-06 DIAGNOSIS — K5900 Constipation, unspecified: Secondary | ICD-10-CM

## 2020-01-06 DIAGNOSIS — K219 Gastro-esophageal reflux disease without esophagitis: Secondary | ICD-10-CM

## 2020-01-06 DIAGNOSIS — Z1239 Encounter for other screening for malignant neoplasm of breast: Secondary | ICD-10-CM

## 2020-01-06 DIAGNOSIS — Z1211 Encounter for screening for malignant neoplasm of colon: Secondary | ICD-10-CM

## 2020-01-06 DIAGNOSIS — Z789 Other specified health status: Secondary | ICD-10-CM

## 2020-01-06 NOTE — Progress Notes (Signed)
   BP 122/76   Pulse 73   Temp 97.7 F (36.5 C)   Ht 4' 10.5" (1.486 m)   Wt 154 lb (69.9 kg)   LMP 03/11/2008 (Approximate)   SpO2 98%   BMI 31.64 kg/m    Subjective:    Patient ID: Janice Wells, female    DOB: 01-12-1961, 59 y.o.   MRN: [id number]  HPI: Janice Wells is a 59 y.o. female presenting on 01/06/2020 for Gastroesophageal Reflux   HPI    Pt had a negative covid 19 screening questionnaire.   Pt is 59yoF with follow-up appointment for GERD. Pt "finished" her omeprazole and didn't get it refilled She finished the rx ibu and got some otc. She is feeling better.  Just has some occassional lower R abdominal discomfort.  BMs sometimes constipation.   She feels constipated usually twice/ week.  She says she doesn't drink enough water.  Pt was no-show to mammogram appointment in august    Relevant past medical, surgical, family and social history reviewed and updated as indicated. Interim medical history since our last visit reviewed. Allergies and medications reviewed and updated.    Review of Systems  Per HPI unless specifically indicated above     Objective:    BP 122/76   Pulse 73   Temp 97.7 F (36.5 C)   Ht 4' 10.5" (1.486 m)   Wt 154 lb (69.9 kg)   LMP 03/11/2008 (Approximate)   SpO2 98%   BMI 31.64 kg/m   Wt Readings from Last 3 Encounters:  01/06/20 154 lb (69.9 kg)  10/25/19 147 lb (66.7 kg)  09/29/19 146 lb 4.8 oz (66.4 kg)    Physical Exam Vitals reviewed.  Constitutional:      General: She is not in acute distress.    Appearance: She is well-developed. She is not ill-appearing.  HENT:     Head: Normocephalic and atraumatic.  Cardiovascular:     Rate and Rhythm: Normal rate and regular rhythm.  Pulmonary:     Effort: Pulmonary effort is normal.     Breath sounds: Normal breath sounds.  Abdominal:     General: Bowel sounds are normal.     Palpations: Abdomen is soft. There is no mass.     Tenderness:  There is no abdominal tenderness.  Musculoskeletal:     Cervical back: Neck supple.     Right lower leg: No edema.     Left lower leg: No edema.  Lymphadenopathy:     Cervical: No cervical adenopathy.  Skin:    General: Skin is warm and dry.  Neurological:     Mental Status: She is alert and oriented to person, place, and time.  Psychiatric:        Behavior: Behavior normal.            Assessment & Plan:    Encounter Diagnoses  Name Primary?  . Gastroesophageal reflux disease, unspecified whether esophagitis present Yes  . Constipation, unspecified constipation type   . Not proficient in [place] language   . Screening for colon cancer   . Encounter for screening for malignant neoplasm of breast, unspecified screening modality     -Gave ifobt for colon cancer screening -pt is encouarged to drink more water to improve constipation.   -pt is rescheduled for screening mammogram

## 2020-01-10 ENCOUNTER — Other Ambulatory Visit: Payer: Self-pay | Admitting: Physician Assistant

## 2020-01-10 DIAGNOSIS — Z1211 Encounter for screening for malignant neoplasm of colon: Secondary | ICD-10-CM

## 2020-01-10 LAB — IFOBT (OCCULT BLOOD): IFOBT: NEGATIVE

## 2020-01-31 ENCOUNTER — Other Ambulatory Visit: Payer: Self-pay

## 2020-01-31 ENCOUNTER — Ambulatory Visit (HOSPITAL_COMMUNITY)
Admission: RE | Admit: 2020-01-31 | Discharge: 2020-01-31 | Disposition: A | Discharge status: Home / Self Care | Payer: Self-pay | Source: Ambulatory Visit | Attending: Physician Assistant | Admitting: Physician Assistant

## 2020-01-31 DIAGNOSIS — Z1239 Encounter for other screening for malignant neoplasm of breast: Secondary | ICD-10-CM

## 2020-01-31 DIAGNOSIS — Z1231 Encounter for screening mammogram for malignant neoplasm of breast: Secondary | ICD-10-CM | POA: Insufficient documentation

## 2020-10-26 ENCOUNTER — Ambulatory Visit: Payer: Self-pay | Admitting: Physician Assistant

## 2020-10-26 ENCOUNTER — Encounter: Payer: Self-pay | Admitting: Physician Assistant

## 2020-10-26 ENCOUNTER — Other Ambulatory Visit: Payer: Self-pay

## 2020-10-26 ENCOUNTER — Other Ambulatory Visit: Payer: Self-pay | Admitting: Physician Assistant

## 2020-10-26 VITALS — BP 136/73 | HR 62 | Temp 97.8°F | Wt 145.0 lb

## 2020-10-26 DIAGNOSIS — Z789 Other specified health status: Secondary | ICD-10-CM

## 2020-10-26 DIAGNOSIS — K219 Gastro-esophageal reflux disease without esophagitis: Secondary | ICD-10-CM

## 2020-10-26 DIAGNOSIS — Z1211 Encounter for screening for malignant neoplasm of colon: Secondary | ICD-10-CM

## 2020-10-26 DIAGNOSIS — R1013 Epigastric pain: Secondary | ICD-10-CM

## 2020-10-26 MED ORDER — PANTOPRAZOLE SODIUM 40 MG PO TBEC: 40.0000 mg | DELAYED_RELEASE_TABLET | Freq: Every day | ORAL | 3 refills | Status: DC

## 2020-10-26 NOTE — Patient Instructions (Signed)
Conn's Current Therapy 2021 (pp. 213-216). [place], [place]: Elsevier.">  Enfermedad de reflujo gastroesofgico en los adultos Gastroesophageal Reflux Disease, Adult El reflujo gastroesofgico (RGE) ocurre cuando el cido del estmago sube por el tubo que conecta la boca con el estmago (esfago). Normalmente, la comida baja por el esfago y se mantiene en el [address], donde se la digiere. Sin embargo, cuando una persona tiene [place], los alimentos y el cido estomacal suelen volver al esfago. Si esto se vuelve un problema ms grave, a la persona se le puede diagnosticar una enfermedad llamada enfermedad de reflujo gastroesofgico (ERGE). La ERGE ocurre cuando el reflujo: Sucede a menudo. Causa sntomas frecuentes o graves. Causa problemas tales como dao en el esfago. Cuando el cido del Proofreader en contacto con el esfago, el cido puede provocar inflamacin en el esfago. Con el tiempo, pueden formarse pequeos agujeros (lceras) en el revestimiento del esfago. Cules son las causas? Esta afeccin se debe a un problema en el msculo que se encuentra entre el esfago y Investment banker, corporate (esfnter esofgico inferior, o EEI). Normalmente, el EEI se cierra una vez que la comida pasa a travs del esfago hasta el [place]. Cuando el EEI se encuentra debilitado o tiene alguna anomala, no se cierra por completo, y eso permite que tanto la comida como eljugo gstrico, que es cido, [place] a subir por Training and development officer. El EEI puede debilitarse a causa de ciertas sustancias alimenticias, medicamentos y Pharmacist, community, que incluyen: El consumo de [place]. [place]. Tener una hernia de hiato. Consumo de alcohol. Ciertos alimentos y bebidas, como caf, chocolate, cebollas y [place]. Qu incrementa el riesgo? Es ms probable que tenga esta afeccin si: Tiene un aumento del Runner, broadcasting/film/video. Tiene un trastorno del tejido conjuntivo. Toma antiinflamatorios no esteroideos (AINE), como el ibuprofeno. Cules son los  signos o sntomas? Los sntomas de esta afeccin incluyen: Merchant navy officer. Dificultad o dolor para tragar o la sensacin de tener un bulto en la garganta. Sabor amargo en la boca. Mal aliento y [place] gran cantidad de saliva. Estmago inflamado o con Dentist y eructos. Dolor en el pecho. El dolor de pecho puede deberse a distintas afecciones. Es importante que consulte al mdico si tiene dolor de [place]. Dificultad para respirar o sibilancias. Tos constante (crnica) o tos nocturna. Desgaste del Engineer, structural. Prdida de peso. Cmo se diagnostica? Esta afeccin se puede diagnosticar en funcin de los antecedentes mdicos y un examen fsico. Para determinar si tiene ERGE leve o grave, el mdico tambin puede controlar cmo usted reacciona al tratamiento. Tambin pueden [employer], que [place] los siguientes: Un estudio para examinarle el [place] y el esfago con una cmara pequea (endoscopa). Una prueba para medir el grado de Technical sales engineer. Una prueba para medir cunta presin hay en el esfago. Un estudio de deglucin con bario comn o modificado para ver la forma, el tamao y el funcionamiento del esfago. Cmo se trata? El tratamiento de esta afeccin puede variar segn la gravedad de los sntomas. El mdico puede recomendarle lo siguiente: Cambios en la dieta. Medicamentos. [place]. El [place] del tratamiento es ayudar a Paramedic los sntomas y Architectural technologist. Siga estas instrucciones en su casa: Comida y bebida  Siga la dieta recomendada por el mdico. Esto puede incluir evitar ciertos alimentos y bebidas, por ejemplo: Caf y t negro, con o sin cafena. Bebidas que contengan alcohol. Bebidas energticas y deportivas. Bebidas gaseosas o refrescos. Chocolate y cacao. Menta y esencia de [place]. Ajo y cebolla. Rbano picante. Alimentos condimentados, picantes y  cidos, por ejemplo, todos los tipos de pimientas, [place] en polvo, curry en polvo,  vinagre, salsas picantes y [address] y sus jugos, por ejemplo, naranjas, limones y limas. Alimentos a base de [address], como salsa de [place], [place], salsa picante y pizza con salsa de [place]. Alimentos fritos y [place], [place] donas, papas fritas y aderezos ricos en grasas. Carnes con alto contenido de grasa, como salchichas, y cortes de carnes rojas y blancas con mucha grasa, por ejemplo, chuletas o costillas, embutidos, jamn y tocino. Productos lcteos ricos en grasas, como leche [place], [place] y Farber crema. Haga comidas pequeas y frecuentes Freight forwarder de comidas abundantes. Evite beber grandes cantidades de lquidos con las comidas. Evite comer 2 o 3 horas antes de acostarse. Evite recostarse inmediatamente despus de comer. No haga ejercicios enseguida despus de comer.  Estilo de vida  No consuma ningn producto que contenga nicotina o tabaco. Estos productos incluyen cigarrillos, tabaco para Theatre manager y aparatos de vapeo, como los Administrator, Civil Service. Si necesita ayuda para dejar de fumar, consulte al [employer]. Trate de reducir el estrs con mtodos como el yoga o la meditacin. Si necesita ayuda para reducir [employer] de estrs, consulte al mdico. Si tiene sobrepeso, baje hasta llegar a un peso saludable para usted. Pdale consejos al mdico para bajar de peso de [place] segura.  Instrucciones generales Est atento a cualquier cambio en los sntomas. Use los medicamentos de venta libre y los recetados solamente como se lo haya indicado el mdico. No tome aspirina, ibuprofeno ni otros antiinflamatorios no esteroideos (AINE) a menos que el mdico le haya indicado que tome estos medicamentos. Use ropa suelta. No use nada apretado alrededor de la cintura que haga presin sobre el abdomen. [place] (eleve) la cabecera de la cama aproximadamente 6 pulgadas (15 cm). Para hacerlo puede usar una cua. Evite inclinarse si al hacerlo empeoran los sntomas. Cumpla con  todas las visitas de seguimiento. Esto es importante. Comunquese con un mdico si: Tiene los siguientes sntomas: Sntomas nuevos. Prdida de peso sin causa aparente. Dificultad o dolor al tragar. Sibilancias o una tos persistente. Voz ronca. Los sntomas no mejoran con Scientist, research (medical). Solicite ayuda de inmediato si: Electronics engineer repentino [employer], el cuello, la [place], los dientes o la espalda. De repente se siente transpirado, mareado o aturdido. Siente falta de aire o Journalist, newspaper. Vomita y el vmito es de color verde, amarillo o negro, o tiene un aspecto similar a la sangre o a los posos de caf. Se desmaya. Tiene heces rojas, sanguinolentas o negras. No puede tragar, beber o comer. Estos sntomas pueden representar un problema grave que constituye Radio broadcast assistant. No espere a ver si los sntomas desaparecen. Solicite atencin mdica de inmediato. Comunquese con el servicio de emergencias de su localidad (911 en los [place]). No conduzca por sus propios medios [employer]. Resumen El reflujo gastroesofgico ocurre cuando el cido del estmago sube al esfago. La ERGE es una enfermedad en la que el reflujo ocurre con frecuencia, causa sntomas frecuentes o graves, o causa problemas tales como dao en el esfago. El tratamiento de esta afeccin puede variar segn la gravedad de los sntomas. El mdico puede indicarle que siga una dieta y haga cambios en su estilo de vida, tome medicamentos o se someta a [place]. Comunquese con un mdico si tiene sntomas nuevos o los sntomas empeoran. Use los medicamentos de venta libre y los recetados solamente como se lo haya indicado el  mdico. No tome aspirina, ibuprofeno ni otros antiinflamatorios no esteroideos (AINE) a menos que el mdico se lo indique. Concurra a todas las visitas de [address] se lo haya indicado el mdico. Esto es importante. Esta informacin no tiene Theme park manager el consejo del  mdico. Asegresede hacerle al mdico cualquier pregunta que tenga. Document Revised: 10/13/2019 Document Reviewed: 10/13/2019 Elsevier Patient Education  2022 [employer].

## 2020-10-26 NOTE — Progress Notes (Signed)
BP 136/73   Pulse 62   Temp 97.8 F (36.6 C)   Wt 145 lb (65.8 kg)   LMP 03/11/2008 (Approximate)   SpO2 98%   BMI 29.79 kg/m    Subjective:    Patient ID: Janice Wells, female    DOB: September 14, 1960, 60 y.o.   MRN: [id number]  HPI: Kathlen Sakurai is a 60 y.o. female presenting on 10/26/2020 for Abdominal Pain   HPI  Pt had a negative covid 19 screening questionnaire.  Chief Complaint  Patient presents with   Abdominal Pain      Pt says she has abdominal pain.  She says she has it twice each month.  She says it is bad.  She says it makes her feel like she doesn't have strength to grab things.    The pain lasts only one day.    Pt says she tried the medication (omeprazole) she was prescribed but it didn't help.  She then says she hasn't used the medication for a year.   She then says she doesn't remember.    Pt has history gerd and constipation.  Pt says her last bm was today.  It was normal.   She has BM every other day.  If she drinks milk, she goes every day.    She denies gas, flatulence.    She denies burning in mouth/esophagus.   She feels burning in abdomen but not mouth or throat.    She works- chopping fruits for [employer]  She says she has No dysuria.  She does  feel nausea when she is having the the abd pain.    She says she throws up just a little bit.    Normal FIT test 01/10/20  She is not hurting today- last day she hurt was a week ago but she could't get off.  She then says she hurts today "just a little bit"  Pt is a poor historian.     Relevant past medical, surgical, family and social history reviewed and updated as indicated. Interim medical history since our last visit reviewed. Allergies and medications reviewed and updated.  CURRENT MEDS: none  Review of Systems  Per HPI unless specifically indicated above     Objective:    BP 136/73   Pulse 62   Temp 97.8 F (36.6 C)   Wt 145 lb (65.8 kg)   LMP 03/11/2008  (Approximate)   SpO2 98%   BMI 29.79 kg/m   Wt Readings from Last 3 Encounters:  10/26/20 145 lb (65.8 kg)  01/06/20 154 lb (69.9 kg)  10/25/19 147 lb (66.7 kg)    Physical Exam Vitals reviewed.  Constitutional:      General: She is not in acute distress.    Appearance: She is well-developed. She is not ill-appearing.  HENT:     Head: Normocephalic and atraumatic.  Cardiovascular:     Rate and Rhythm: Normal rate and regular rhythm.  Pulmonary:     Effort: Pulmonary effort is normal.     Breath sounds: Normal breath sounds.  Abdominal:     General: Bowel sounds are normal. There is no distension.     Palpations: Abdomen is soft. There is no hepatomegaly, splenomegaly, mass or pulsatile mass.     Tenderness: There is abdominal tenderness in the epigastric area. There is no guarding or rebound.     Comments: Epigastric tenderness very mild with no rebound or guarding  Musculoskeletal:     Cervical back: Neck supple.  Right lower leg: No edema.     Left lower leg: No edema.  Lymphadenopathy:     Cervical: No cervical adenopathy.  Skin:    General: Skin is warm and dry.  Neurological:     Mental Status: She is alert and oriented to person, place, and time.  Psychiatric:        Behavior: Behavior normal.             Assessment & Plan:    Encounter Diagnoses  Name Primary?   Gastroesophageal reflux disease, unspecified whether esophagitis present Yes   Not proficient in English language    Screening for colon cancer    Epigastric pain       -Rx protonix.  Pt counseled on food changes and she was given handout -pt was given FIT test for colon cancer screening -pt to follow up 1 month for recheck.  She is to contact office sooner for worsening or new symptoms

## 2020-10-30 LAB — IFOBT (OCCULT BLOOD): IFOBT: NEGATIVE

## 2020-11-27 ENCOUNTER — Encounter: Payer: Self-pay | Admitting: Physician Assistant

## 2020-11-27 ENCOUNTER — Ambulatory Visit: Payer: Self-pay | Admitting: Physician Assistant

## 2020-11-27 VITALS — BP 130/78 | HR 60 | Temp 97.8°F | Wt 140.0 lb

## 2020-11-27 DIAGNOSIS — K219 Gastro-esophageal reflux disease without esophagitis: Secondary | ICD-10-CM

## 2020-11-27 DIAGNOSIS — Z789 Other specified health status: Secondary | ICD-10-CM

## 2020-11-27 HISTORY — DX: Gastro-esophageal reflux disease without esophagitis: K21.9

## 2020-11-27 NOTE — Progress Notes (Signed)
   BP 130/78   Pulse 60   Temp 97.8 F (36.6 C)   Wt 140 lb (63.5 kg)   LMP 03/11/2008 (Approximate)   SpO2 98%   BMI 28.76 kg/m    Subjective:    Patient ID: Janice Wells, female    DOB: Nov 28, 1960, 60 y.o.   MRN: [id number]  HPI: Janice Wells is a 60 y.o. female presenting on 11/27/2020 for Abdominal Pain   HPI  Pt had a negative covid 19 screening questionnaire.  Chief Complaint  Patient presents with   Abdominal Pain    Pt says her abdominal Pain has been improving since she got back on her protonix medication last month.     Relevant past medical, surgical, family and social history reviewed and updated as indicated. Interim medical history since our last visit reviewed. Allergies and medications reviewed and updated.   Current Outpatient Medications:    pantoprazole (PROTONIX) 40 MG tablet, Take 1 tablet (40 mg total) by mouth daily. Tome una tableta por boca diaria, Disp: 30 tablet, Rfl: 3    Review of Systems  Per HPI unless specifically indicated above     Objective:    BP 130/78   Pulse 60   Temp 97.8 F (36.6 C)   Wt 140 lb (63.5 kg)   LMP 03/11/2008 (Approximate)   SpO2 98%   BMI 28.76 kg/m   Wt Readings from Last 3 Encounters:  11/27/20 140 lb (63.5 kg)  10/26/20 145 lb (65.8 kg)  01/06/20 154 lb (69.9 kg)    Physical Exam Vitals reviewed.  Constitutional:      General: She is not in acute distress.    Appearance: She is well-developed. She is not ill-appearing.  HENT:     Head: Normocephalic and atraumatic.  Cardiovascular:     Rate and Rhythm: Normal rate and regular rhythm.  Pulmonary:     Effort: Pulmonary effort is normal.     Breath sounds: Normal breath sounds.  Abdominal:     General: Bowel sounds are normal.     Palpations: Abdomen is soft. There is no mass.     Tenderness: There is no abdominal tenderness.  Musculoskeletal:     Cervical back: Neck supple.     Right lower leg: No edema.      Left lower leg: No edema.  Lymphadenopathy:     Cervical: No cervical adenopathy.  Skin:    General: Skin is warm and dry.  Neurological:     Mental Status: She is alert and oriented to person, place, and time.  Psychiatric:        Behavior: Behavior normal.           Assessment & Plan:   Encounter Diagnoses  Name Primary?   Gastroesophageal reflux disease, unspecified whether esophagitis present Yes   Not proficient in English language      -pt to Continue protonix -pt to follow up  2 months.  She is to contact office sooner prn worsening or new symptoms

## 2020-12-18 ENCOUNTER — Ambulatory Visit: Payer: Self-pay | Admitting: Physician Assistant

## 2021-01-22 ENCOUNTER — Encounter: Payer: Self-pay | Admitting: Physician Assistant

## 2021-01-22 ENCOUNTER — Ambulatory Visit: Payer: Self-pay | Admitting: Physician Assistant

## 2021-01-22 VITALS — BP 125/79 | HR 56 | Temp 97.3°F | Wt 145.0 lb

## 2021-01-22 DIAGNOSIS — K219 Gastro-esophageal reflux disease without esophagitis: Secondary | ICD-10-CM

## 2021-01-22 DIAGNOSIS — Z1239 Encounter for other screening for malignant neoplasm of breast: Secondary | ICD-10-CM

## 2021-01-22 DIAGNOSIS — Z789 Other specified health status: Secondary | ICD-10-CM

## 2021-01-22 MED ORDER — PANTOPRAZOLE SODIUM 40 MG PO TBEC: 40.0000 mg | DELAYED_RELEASE_TABLET | Freq: Every day | ORAL | 6 refills | Status: DC

## 2021-01-22 NOTE — Progress Notes (Signed)
   BP 125/79   Pulse (!) 56   Temp (!) 97.3 F (36.3 C)   Wt 145 lb (65.8 kg)   LMP 03/11/2008 (Approximate)   SpO2 99%   BMI 29.79 kg/m    Subjective:    Patient ID: Janice Wells, female    DOB: [birth year], 60 y.o.   MRN: [id number]  HPI: Janice Wells is a 60 y.o. female presenting on 01/22/2021 for Abdominal Pain   HPI   Chief Complaint  Patient presents with   Abdominal Pain     She is taking her protonix.  She says she is not having any abdominal pain now.     Relevant past medical, surgical, family and social history reviewed and updated as indicated. Interim medical history since our last visit reviewed. Allergies and medications reviewed and updated.   Current Outpatient Medications:    pantoprazole (PROTONIX) 40 MG tablet, Take 1 tablet (40 mg total) by mouth daily. Tome una tableta por boca diaria, Disp: 30 tablet, Rfl: 3    Review of Systems  Per HPI unless specifically indicated above     Objective:    BP 125/79   Pulse (!) 56   Temp (!) 97.3 F (36.3 C)   Wt 145 lb (65.8 kg)   LMP 03/11/2008 (Approximate)   SpO2 99%   BMI 29.79 kg/m   Wt Readings from Last 3 Encounters:  01/22/21 145 lb (65.8 kg)  11/27/20 140 lb (63.5 kg)  10/26/20 145 lb (65.8 kg)    Physical Exam Vitals reviewed.  Constitutional:      General: She is not in acute distress.    Appearance: She is well-developed. She is not ill-appearing.  HENT:     Head: Normocephalic and atraumatic.  Cardiovascular:     Rate and Rhythm: Normal rate and regular rhythm.  Pulmonary:     Effort: Pulmonary effort is normal.     Breath sounds: Normal breath sounds.  Abdominal:     General: Bowel sounds are normal.     Palpations: Abdomen is soft. There is no mass.     Tenderness: There is no abdominal tenderness.  Musculoskeletal:     Cervical back: Neck supple.     Right lower leg: No edema.     Left lower leg: No edema.  Lymphadenopathy:     Cervical:  No cervical adenopathy.  Skin:    General: Skin is warm and dry.  Neurological:     Mental Status: She is alert and oriented to person, place, and time.  Psychiatric:        Behavior: Behavior normal.           Assessment & Plan:    Encounter Diagnoses  Name Primary?   Gastroesophageal reflux disease, unspecified whether esophagitis present Yes   Not proficient in English language    Encounter for screening for malignant neoplasm of breast, unspecified screening modality     -pt is encouraged to continue ppi -will refer pt for screening Mammogram -pt got Flu shot today.   -she is to follow up 6 months.  She is to contact office sooner prn

## 2021-01-30 ENCOUNTER — Other Ambulatory Visit: Payer: Self-pay

## 2021-01-30 DIAGNOSIS — Z1231 Encounter for screening mammogram for malignant neoplasm of breast: Secondary | ICD-10-CM

## 2021-02-02 ENCOUNTER — Other Ambulatory Visit: Payer: Self-pay

## 2021-02-02 ENCOUNTER — Inpatient Hospital Stay (HOSPITAL_COMMUNITY)
Admission: EM | Admit: 2021-02-02 | Discharge: 2021-02-04 | DRG: 419 | Disposition: A | Discharge status: Home / Self Care | Payer: Self-pay | Attending: General Surgery | Admitting: General Surgery

## 2021-02-02 ENCOUNTER — Emergency Department (HOSPITAL_COMMUNITY): Payer: Self-pay

## 2021-02-02 ENCOUNTER — Encounter (HOSPITAL_COMMUNITY): Payer: Self-pay | Admitting: Emergency Medicine

## 2021-02-02 DIAGNOSIS — K219 Gastro-esophageal reflux disease without esophagitis: Secondary | ICD-10-CM | POA: Diagnosis present

## 2021-02-02 DIAGNOSIS — K810 Acute cholecystitis: Secondary | ICD-10-CM | POA: Diagnosis present

## 2021-02-02 DIAGNOSIS — E669 Obesity, unspecified: Secondary | ICD-10-CM | POA: Diagnosis present

## 2021-02-02 DIAGNOSIS — K8000 Calculus of gallbladder with acute cholecystitis without obstruction: Principal | ICD-10-CM | POA: Diagnosis present

## 2021-02-02 DIAGNOSIS — Z79899 Other long term (current) drug therapy: Secondary | ICD-10-CM

## 2021-02-02 DIAGNOSIS — Z20822 Contact with and (suspected) exposure to covid-19: Secondary | ICD-10-CM | POA: Diagnosis present

## 2021-02-02 DIAGNOSIS — Z6830 Body mass index (BMI) 30.0-30.9, adult: Secondary | ICD-10-CM

## 2021-02-02 DIAGNOSIS — R112 Nausea with vomiting, unspecified: Secondary | ICD-10-CM

## 2021-02-02 DIAGNOSIS — K829 Disease of gallbladder, unspecified: Secondary | ICD-10-CM

## 2021-02-02 HISTORY — DX: Acute cholecystitis: K81.0

## 2021-02-02 LAB — CBC WITH DIFFERENTIAL/PLATELET
Abs Immature Granulocytes: 0.04 10*3/uL (ref 0.00–0.07)
Basophils Absolute: 0 10*3/uL (ref 0.0–0.1)
Basophils Relative: 0 %
Eosinophils Absolute: 0 10*3/uL (ref 0.0–0.5)
Eosinophils Relative: 0 %
HCT: 44.6 % (ref 36.0–46.0)
Hemoglobin: 15 g/dL (ref 12.0–15.0)
Immature Granulocytes: 0 %
Lymphocytes Relative: 12 %
Lymphs Abs: 1.5 10*3/uL (ref 0.7–4.0)
MCH: 29.6 pg (ref 26.0–34.0)
MCHC: 33.6 g/dL (ref 30.0–36.0)
MCV: 88.1 fL (ref 80.0–100.0)
Monocytes Absolute: 0.9 10*3/uL (ref 0.1–1.0)
Monocytes Relative: 7 %
Neutro Abs: 10.2 10*3/uL — ABNORMAL HIGH (ref 1.7–7.7)
Neutrophils Relative %: 81 %
Platelets: 258 10*3/uL (ref 150–400)
RBC: 5.06 MIL/uL (ref 3.87–5.11)
RDW: 13.2 % (ref 11.5–15.5)
WBC: 12.7 10*3/uL — ABNORMAL HIGH (ref 4.0–10.5)
nRBC: 0 % (ref 0.0–0.2)

## 2021-02-02 LAB — COMPREHENSIVE METABOLIC PANEL
ALT: 14 U/L (ref 0–44)
AST: 19 U/L (ref 15–41)
Albumin: 4.1 g/dL (ref 3.5–5.0)
Alkaline Phosphatase: 111 U/L (ref 38–126)
Anion gap: 14 (ref 5–15)
BUN: 26 mg/dL — ABNORMAL HIGH (ref 8–23)
CO2: 23 mmol/L (ref 22–32)
Calcium: 9 mg/dL (ref 8.9–10.3)
Chloride: 101 mmol/L (ref 98–111)
Creatinine, Ser: 0.77 mg/dL (ref 0.44–1.00)
GFR, Estimated: >60 mL/min (ref >60)
Glucose, Bld: 121 mg/dL — ABNORMAL HIGH (ref 70–99)
Potassium: 3.6 mmol/L (ref 3.5–5.1)
Sodium: 138 mmol/L (ref 135–145)
Total Bilirubin: 0.7 mg/dL (ref 0.3–1.2)
Total Protein: 8.6 g/dL — ABNORMAL HIGH (ref 6.5–8.1)

## 2021-02-02 LAB — URINALYSIS, ROUTINE W REFLEX MICROSCOPIC
Bilirubin Urine: NEGATIVE
Glucose, UA: NEGATIVE mg/dL
Ketones, ur: NEGATIVE mg/dL
Nitrite: NEGATIVE
Specific Gravity, Urine: 1.025 (ref 1.005–1.030)
pH: 6 (ref 5.0–8.0)

## 2021-02-02 LAB — LIPASE, BLOOD: Lipase: 33 U/L (ref 11–51)

## 2021-02-02 LAB — RESP PANEL BY RT-PCR (FLU A&B, COVID) ARPGX2
Influenza A by PCR: NEGATIVE
Influenza B by PCR: NEGATIVE
SARS Coronavirus 2 by RT PCR: NEGATIVE

## 2021-02-02 LAB — URINALYSIS, MICROSCOPIC (REFLEX)

## 2021-02-02 LAB — HIV ANTIBODY (ROUTINE TESTING W REFLEX): HIV Screen 4th Generation wRfx: NONREACTIVE

## 2021-02-02 LAB — PREGNANCY, URINE: Preg Test, Ur: NEGATIVE

## 2021-02-02 MED ORDER — PIPERACILLIN-TAZOBACTAM 3.375 G IVPB
3.3750 g | Freq: Three times a day (TID) | INTRAVENOUS | Status: DC
  Administered 2021-02-02 – 2021-02-04 (×5): 3.375 g via INTRAVENOUS
  Filled 2021-02-02 (×4): qty 50

## 2021-02-02 MED ORDER — ACETAMINOPHEN 650 MG RE SUPP: 650.0000 mg | Freq: Four times a day (QID) | RECTAL | Status: DC | PRN

## 2021-02-02 MED ORDER — HYDROMORPHONE HCL 1 MG/ML IJ SOLN
0.5000 mg | INTRAMUSCULAR | Status: DC | PRN
  Administered 2021-02-02 – 2021-02-03 (×4): 0.5 mg via INTRAVENOUS
  Filled 2021-02-02 (×4): qty 0.5

## 2021-02-02 MED ORDER — ENOXAPARIN SODIUM 40 MG/0.4ML IJ SOSY
40.0000 mg | PREFILLED_SYRINGE | INTRAMUSCULAR | Status: DC
  Administered 2021-02-02 – 2021-02-03 (×2): 40 mg via SUBCUTANEOUS
  Filled 2021-02-02 (×2): qty 0.4

## 2021-02-02 MED ORDER — SODIUM CHLORIDE 0.9 % IV BOLUS
500.0000 mL | Freq: Once | INTRAVENOUS | Status: AC
  Administered 2021-02-02: 500 mL via INTRAVENOUS

## 2021-02-02 MED ORDER — CHLORHEXIDINE GLUCONATE CLOTH 2 % EX PADS
6.0000 | MEDICATED_PAD | Freq: Once | CUTANEOUS | Status: AC
  Administered 2021-02-03: 6 via TOPICAL

## 2021-02-02 MED ORDER — ONDANSETRON HCL 4 MG/2ML IJ SOLN
4.0000 mg | Freq: Four times a day (QID) | INTRAMUSCULAR | Status: DC | PRN
  Administered 2021-02-03 – 2021-02-04 (×3): 4 mg via INTRAVENOUS
  Filled 2021-02-02 (×3): qty 2

## 2021-02-02 MED ORDER — ONDANSETRON HCL 4 MG PO TABS: 4.0000 mg | ORAL_TABLET | Freq: Four times a day (QID) | ORAL | Status: DC | PRN

## 2021-02-02 MED ORDER — PIPERACILLIN-TAZOBACTAM 3.375 G IVPB 30 MIN
3.3750 g | Freq: Once | INTRAVENOUS | Status: AC
  Administered 2021-02-02: 3.375 g via INTRAVENOUS
  Filled 2021-02-02: qty 50

## 2021-02-02 MED ORDER — CHLORHEXIDINE GLUCONATE CLOTH 2 % EX PADS
6.0000 | MEDICATED_PAD | Freq: Once | CUTANEOUS | Status: AC
  Administered 2021-02-02: 6 via TOPICAL

## 2021-02-02 MED ORDER — MUPIROCIN 2 % EX OINT
1.0000 "application " | TOPICAL_OINTMENT | Freq: Two times a day (BID) | CUTANEOUS | Status: DC
  Administered 2021-02-02 – 2021-02-04 (×4): 1 via NASAL
  Filled 2021-02-02: qty 22

## 2021-02-02 MED ORDER — SODIUM CHLORIDE 0.9 % IV SOLN: INTRAVENOUS | Status: DC

## 2021-02-02 MED ORDER — ACETAMINOPHEN 325 MG PO TABS
650.0000 mg | ORAL_TABLET | Freq: Four times a day (QID) | ORAL | Status: DC | PRN
  Administered 2021-02-02: 650 mg via ORAL
  Filled 2021-02-02: qty 2

## 2021-02-02 NOTE — Progress Notes (Signed)
Pharmacy Antibiotic Note  Janice Wells is a 60 y.o. female admitted on 02/02/2021 with  intra-abdominal infection .  Pharmacy has been consulted for Zosyn dosing.  Plan: Zosyn 3.375g IV q8h (4 hour infusion). Monitor labs, c/s, and patient improvement.  Height: 4\' 10"  (147.3 cm) Weight: 66.7 kg (147 lb) IBW/kg (Calculated) : 40.9  Temp (24hrs), Avg:97.6 F (36.4 C), Min:97.6 F (36.4 C), Max:97.6 F (36.4 C)  Recent Labs  Lab 02/02/21 0935  WBC 12.7*  CREATININE 0.77    Estimated Creatinine Clearance: 58.9 mL/min (by C-G formula based on SCr of 0.77 mg/dL).    No Known Allergies  Antimicrobials this admission: Zosyn 11/25 >>  Microbiology results:   Thank you for allowing pharmacy to be a part of this patient's care.  12/25 02/02/2021 12:10 PM

## 2021-02-02 NOTE — Consult Note (Signed)
Patient scheduled for laparoscopic cholecystectomy tomorrow in am.  Will see patient prior to surgery.  Orders placed.

## 2021-02-02 NOTE — ED Provider Notes (Signed)
[facility] EMERGENCY DEPARTMENT Provider Note   CSN: [id number] Arrival date & time: 02/02/21  0755     History Chief Complaint  Patient presents with   Abdominal Pain    Janice Wells is a 60 y.o. female. History and physical performed with Spanish language translator by telemetry. HPI She presents for evaluation of abdominal pain, with nausea and vomiting for 3 days.  She denies diarrhea.  She states the pain radiates to her left mid back.  She denies fever, blood in emesis, recurrent similar symptoms.  She is using aspirin without relief.  She was also seen about a week ago at a clinic for abdominal pain and given a medication, possibly pantoprazole.  She states this did not help her pain.  She is here with her son who did not offer any additional concerns or questions.  There are no other known active modifying factors.    History reviewed. No pertinent past medical history.  Patient Active Problem List   Diagnosis Date Noted   Acute cholecystitis 02/02/2021   Gastroesophageal reflux disease 11/27/2020   Obesity, unspecified 02/15/2015    History reviewed. No pertinent surgical history.   OB History     Gravida  0   Para  0   Term  0   Preterm  0   AB  0   Living         SAB  0   IAB  0   Ectopic  0   Multiple      Live Births              Family History  Problem Relation Age of Onset   Diabetes Sister    Cancer Brother     Social History   Tobacco Use   Smoking status: Never   Smokeless tobacco: Never  Substance Use Topics   Alcohol use: No   Drug use: No    Home Medications Prior to Admission medications   Medication Sig Start Date End Date Taking? Authorizing Provider  pantoprazole (PROTONIX) 40 MG tablet Take 1 tablet (40 mg total) by mouth daily. Tome una tableta por boca diaria 01/22/21  Yes Jacquelin Hawking, PA-C    Allergies    Patient has no known allergies.  Review of Systems   Review of Systems  All  other systems reviewed and are negative.  Physical Exam Updated Vital Signs BP (!) 152/67   Pulse (!) 59   Temp 97.6 F (36.4 C) (Oral)   Resp 14   Ht 4\' 10"  (1.473 m)   Wt 66.7 kg   LMP 03/11/2008 (Approximate)   SpO2 99%   BMI 30.72 kg/m   Physical Exam Vitals and nursing note reviewed.  Constitutional:      General: She is not in acute distress.    Appearance: She is well-developed. She is not toxic-appearing or diaphoretic.  HENT:     Head: Normocephalic and atraumatic.     Right Ear: External ear normal.     Left Ear: External ear normal.  Eyes:     Conjunctiva/sclera: Conjunctivae normal.     Pupils: Pupils are equal, round, and reactive to light.  Neck:     Trachea: Phonation normal.  Cardiovascular:     Rate and Rhythm: Normal rate and regular rhythm.     Heart sounds: Normal heart sounds.  Pulmonary:     Effort: Pulmonary effort is normal.     Breath sounds: Normal breath sounds.  Abdominal:  General: There is no distension.     Palpations: Abdomen is soft. There is no mass.     Tenderness: There is abdominal tenderness (Bilateral upper quadrants, mild). There is no guarding.     Hernia: No hernia is present.  Musculoskeletal:        General: Normal range of motion.     Cervical back: Normal range of motion and neck supple.     Comments: Left flank tenderness to light touch.  Skin:    General: Skin is warm and dry.  Neurological:     Mental Status: She is alert and oriented to person, place, and time.     Cranial Nerves: No cranial nerve deficit.     Sensory: No sensory deficit.     Motor: No abnormal muscle tone.     Coordination: Coordination normal.  Psychiatric:        Mood and Affect: Mood normal.        Behavior: Behavior normal.        Thought Content: Thought content normal.        Judgment: Judgment normal.    ED Results / Procedures / Treatments   Labs (all labs ordered are listed, but only abnormal results are displayed) Labs  Reviewed  COMPREHENSIVE METABOLIC PANEL - Abnormal; Notable for the following components:      Result Value   Glucose, Bld 121 (*)    BUN 26 (*)    Total Protein 8.6 (*)    All other components within normal limits  CBC WITH DIFFERENTIAL/PLATELET - Abnormal; Notable for the following components:   WBC 12.7 (*)    Neutro Abs 10.2 (*)    All other components within normal limits  URINALYSIS, ROUTINE W REFLEX MICROSCOPIC - Abnormal; Notable for the following components:   APPearance HAZY (*)    Hgb urine dipstick TRACE (*)    Protein, ur TRACE (*)    Leukocytes,Ua MODERATE (*)    All other components within normal limits  URINALYSIS, MICROSCOPIC (REFLEX) - Abnormal; Notable for the following components:   Bacteria, UA FEW (*)    All other components within normal limits  LIPASE, BLOOD  PREGNANCY, URINE    EKG None  Radiology US Abdomen Complete  Result Date: 02/02/2021 CLINICAL DATA:  Abdominal and flank pain EXAM: ABDOMEN ULTRASOUND COMPLETE COMPARISON:  None. FINDINGS: Gallbladder: Gallstones are present measuring up to 8 mm. Sludge is present. No wall thickening visualized. No sonographic Murphy sign noted by sonographer. Common bile duct: Diameter: 4 mm Liver: No focal lesion identified. Increased parenchymal echogenicity. Portal vein is patent on color Doppler imaging with normal direction of blood flow towards the liver. IVC: No abnormality visualized. Pancreas: Obscured. Spleen: Size and appearance within normal limits. Right Kidney: Length: 10.2 cm. Echogenicity within normal limits. No mass or hydronephrosis visualized. Left Kidney: Length: 9.7 cm. Echogenicity within normal limits. No mass or hydronephrosis visualized. Abdominal aorta: No aneurysm visualized. Other findings: None. IMPRESSION: Gallbladder stones and sludge. No sonographic evidence of acute cholecystitis. Increased liver echogenicity likely reflecting steatosis. Electronically Signed   By: Guadlupe Spanish M.D.    On: 02/02/2021 10:51    Procedures Procedures   Medications Ordered in ED Medications  0.9 %  sodium chloride infusion (has no administration in time range)  piperacillin-tazobactam (ZOSYN) IVPB 3.375 g (has no administration in time range)  sodium chloride 0.9 % bolus 500 mL (500 mLs Intravenous Bolus 02/02/21 1035)    ED Course  I have reviewed the triage  vital signs and the nursing notes.  Pertinent labs & imaging results that were available during my care of the patient were reviewed by me and considered in my medical decision making (see chart for details).    MDM Rules/Calculators/A&P                            Patient Vitals for the past 24 hrs:  BP Temp Temp src Pulse Resp SpO2 Height Weight  02/02/21 1100 (!) 152/67 -- -- (!) 59 14 99 % -- --  02/02/21 1045 -- -- -- 71 15 96 % -- --  02/02/21 1030 -- -- -- 75 14 96 % -- --  02/02/21 1015 -- -- -- 76 18 97 % -- --  02/02/21 1000 (!) 156/71 -- -- 65 15 96 % -- --  02/02/21 0900 129/66 -- -- 72 18 93 % -- --  02/02/21 0809 -- -- -- -- -- -- 4\' 10"  (1.473 m) 66.7 kg  02/02/21 0805 (!) 146/86 97.6 F (36.4 C) Oral 92 16 98 % -- --    11:57 AM Reevaluation with update and discussion. After initial assessment and treatment, an updated evaluation reveals no change in clinical exam. 02/04/21   Medical Decision Making:  This patient is presenting for evaluation of abdominal pain with nausea and vomiting, which does require a range of treatment options, and is a complaint that involves a moderate risk of morbidity and mortality. The differential diagnoses include gallbladder disease, pyelonephritis, intestinal disorders and metabolic effects. I decided to review old records, and in summary elderly female presenting with symptoms concerning for intra-abdominal processes including cholelithiasis and pyelonephritis.  She does not have chronic symptoms.  She did not respond to PPI taken for same symptoms. I did not require  additional historical information from anyone.  Clinical Laboratory Tests Ordered, included CBC, Metabolic panel, Urinalysis, and lipase . Review indicates normal except glucose high, BUN high, total protein high, white count high, urinalysis abnormal. Radiologic Tests Ordered, included abdominal ultrasound.  I independently Visualized: Ultrasound images, which show gallstones with sludge   Critical Interventions-clinical evaluation, laboratory testing, ultrasonography, discussion with general surgery, Dr. Mancel Bale plans to do a cholecystectomy, tomorrow.  After These Interventions, the Patient was reevaluated and was found with symptomatic gallstones, required cholecystectomy for persistent vomiting.  No apparent signs of obstruction of the biliary tree.  Doubt sepsis or cholecystitis.  Patient will be admitted by hospitalist.  CRITICAL CARE- yes Performed by: Lovell Sheehan  Nursing Notes Reviewed/ Care Coordinated Applicable Imaging Reviewed Interpretation of Laboratory Data incorporated into ED treatment  11:45 AM--Consult complete with hospital. Patient case explained and discussed.  He- agrees to admit patient for further evaluation and treatment. Call ended at 11:58 AM   Final Clinical Impression(s) / ED Diagnoses Final diagnoses:  Gallbladder disease  Nausea and vomiting, unspecified vomiting type    Rx / DC Orders ED Discharge Orders     None        Mancel Bale, MD 02/02/21 1202

## 2021-02-02 NOTE — H&P (Signed)
History and Physical    [name] AJG:[id number] DOB: 04-02-1960 DOA: 02/02/2021  PCP: Jacquelin Hawking, PA-C   Patient coming from: Home  Chief Complaint: Abdominal pain, N/V  HPI: [name] is a 60 y.o. female with medical history significant for obesity and GERD who presented to the ED with complaints of epigastric abdominal pain as well as nausea and vomiting over the last 3 days.  Interpreter used at bedside.  She appears to have had some ongoing symptoms over the last 3 years, but the intensity of her symptoms have worsened significantly in the last 3 days.  She denies any significant weight loss.  No diarrhea noted.  The pain appears to radiate to her left mid back and she denies any fevers or chills.  She denies any hematemesis and has been using aspirin at home without any relief.  She was also seen in the clinic about a week ago for this abdominal pain and was started on Protonix, but this has given her no relief.   ED Course: Vital signs stable and patient is afebrile.  Leukocytosis of 12.7 noted.  Urine with moderate leukocytes, no other significant abnormalities noted.  Ultrasound the gallbladder with gallstones and sludge noted.  No LFT or bilirubin elevation noted.  General surgery planning for lap chole in a.m. and recommending initiation of IV Zosyn.  Review of Systems: Reviewed as noted above, otherwise negative.  History reviewed. No pertinent past medical history.  History reviewed. No pertinent surgical history.   reports that she has never smoked. She has never used smokeless tobacco. She reports that she does not drink alcohol and does not use drugs.  No Known Allergies  Family History  Problem Relation Age of Onset   Diabetes Sister    Cancer Brother     Prior to Admission medications   Medication Sig Start Date End Date Taking? Authorizing Provider  pantoprazole (PROTONIX) 40 MG tablet Take 1 tablet (40 mg total) by mouth  daily. Tome una tableta por boca diaria 01/22/21  Yes Jacquelin Hawking, PA-C    Physical Exam: Vitals:   02/02/21 1015 02/02/21 1030 02/02/21 1045 02/02/21 1100  BP:    (!) 152/67  Pulse: 76 75 71 (!) 59  Resp: 18 14 15 14   Temp:      TempSrc:      SpO2: 97% 96% 96% 99%  Weight:      Height:        Constitutional: NAD, calm, comfortable Vitals:   02/02/21 1015 02/02/21 1030 02/02/21 1045 02/02/21 1100  BP:    (!) 152/67  Pulse: 76 75 71 (!) 59  Resp: 18 14 15 14   Temp:      TempSrc:      SpO2: 97% 96% 96% 99%  Weight:      Height:       Eyes: lids and conjunctivae normal Neck: normal, supple Respiratory: clear to auscultation bilaterally. Normal respiratory effort. No accessory muscle use.  Cardiovascular: Regular rate and rhythm, no murmurs. Abdomen: Tenderness in the epigastric region on palpation, no distention. Bowel sounds positive.  Musculoskeletal:  No edema. Skin: no rashes, lesions, ulcers.  Psychiatric: Flat affect  Labs on Admission: I have personally reviewed following labs and imaging studies  CBC: Recent Labs  Lab 02/02/21 0935  WBC 12.7*  NEUTROABS 10.2*  HGB 15.0  HCT 44.6  MCV 88.1  PLT 258   Basic Metabolic Panel: Recent Labs  Lab 02/02/21 0935  NA 138  K 3.6  CL 101  CO2 23  GLUCOSE 121*  BUN 26*  CREATININE 0.77  CALCIUM 9.0   GFR: Estimated Creatinine Clearance: 58.9 mL/min (by C-G formula based on SCr of 0.77 mg/dL). Liver Function Tests: Recent Labs  Lab 02/02/21 0935  AST 19  ALT 14  ALKPHOS 111  BILITOT 0.7  PROT 8.6*  ALBUMIN 4.1   Recent Labs  Lab 02/02/21 0935  LIPASE 33   No results for input(s): AMMONIA in the last 168 hours. Coagulation Profile: No results for input(s): INR, PROTIME in the last 168 hours. Cardiac Enzymes: No results for input(s): CKTOTAL, CKMB, CKMBINDEX, TROPONINI in the last 168 hours. BNP (last 3 results) No results for input(s): PROBNP in the last 8760 hours. HbA1C: No results  for input(s): HGBA1C in the last 72 hours. CBG: No results for input(s): GLUCAP in the last 168 hours. Lipid Profile: No results for input(s): CHOL, HDL, LDLCALC, TRIG, CHOLHDL, LDLDIRECT in the last 72 hours. Thyroid Function Tests: No results for input(s): TSH, T4TOTAL, FREET4, T3FREE, THYROIDAB in the last 72 hours. Anemia Panel: No results for input(s): VITAMINB12, FOLATE, FERRITIN, TIBC, IRON, RETICCTPCT in the last 72 hours. Urine analysis:    Component Value Date/Time   COLORURINE YELLOW 02/02/2021 0946   APPEARANCEUR HAZY (A) 02/02/2021 0946   LABSPEC 1.025 02/02/2021 0946   PHURINE 6.0 02/02/2021 0946   GLUCOSEU NEGATIVE 02/02/2021 0946   HGBUR TRACE (A) 02/02/2021 0946   BILIRUBINUR NEGATIVE 02/02/2021 0946   BILIRUBINUR NEG 08/27/2016 1319   KETONESUR NEGATIVE 02/02/2021 0946   PROTEINUR TRACE (A) 02/02/2021 0946   UROBILINOGEN 0.2 08/27/2016 1319   NITRITE NEGATIVE 02/02/2021 0946   LEUKOCYTESUR MODERATE (A) 02/02/2021 0946    Radiological Exams on Admission: US Abdomen Complete  Result Date: 02/02/2021 CLINICAL DATA:  Abdominal and flank pain EXAM: ABDOMEN ULTRASOUND COMPLETE COMPARISON:  None. FINDINGS: Gallbladder: Gallstones are present measuring up to 8 mm. Sludge is present. No wall thickening visualized. No sonographic Murphy sign noted by sonographer. Common bile duct: Diameter: 4 mm Liver: No focal lesion identified. Increased parenchymal echogenicity. Portal vein is patent on color Doppler imaging with normal direction of blood flow towards the liver. IVC: No abnormality visualized. Pancreas: Obscured. Spleen: Size and appearance within normal limits. Right Kidney: Length: 10.2 cm. Echogenicity within normal limits. No mass or hydronephrosis visualized. Left Kidney: Length: 9.7 cm. Echogenicity within normal limits. No mass or hydronephrosis visualized. Abdominal aorta: No aneurysm visualized. Other findings: None. IMPRESSION: Gallbladder stones and sludge. No  sonographic evidence of acute cholecystitis. Increased liver echogenicity likely reflecting steatosis. Electronically Signed   By: Guadlupe Spanish M.D.   On: 02/02/2021 10:51     Assessment/Plan Principal Problem:   Acute cholecystitis    Abdominal pain with nausea and vomiting suspicious for cholecystitis -Clear liquid diet with n.p.o. after midnight -IV Zofran and pain medications for symptomatic management -Follow CBC to monitor leukocytosis -IV fluid -IV Zosyn -General surgery with plans for laparoscopic cholecystectomy in a.m.  Obesity -Lifestyle changes outpatient   DVT prophylaxis: Heparin Code Status: Full Family Communication: Son at bedside Disposition Plan:Admit for cholecystectomy Consults called:General Surgery Admission status: Inpatient, MedSurg  Parish Dubose D Crickett Abbett DO [facility]  If 7PM-7AM, please contact night-coverage [email or web address]  02/02/2021, 11:57 AM

## 2021-02-02 NOTE — ED Triage Notes (Signed)
Pt c/o of lower abdominal pain x3days. N/V x3days

## 2021-02-03 ENCOUNTER — Encounter (HOSPITAL_COMMUNITY)
Admission: EM | Disposition: A | Discharge status: Home / Self Care | Payer: Self-pay | Source: Home / Self Care | Attending: General Surgery

## 2021-02-03 ENCOUNTER — Inpatient Hospital Stay (HOSPITAL_COMMUNITY): Payer: Self-pay | Admitting: Anesthesiology

## 2021-02-03 DIAGNOSIS — K810 Acute cholecystitis: Secondary | ICD-10-CM

## 2021-02-03 HISTORY — PX: CHOLECYSTECTOMY: SHX55

## 2021-02-03 LAB — CBC
HCT: 45.5 % (ref 36.0–46.0)
Hemoglobin: 15 g/dL (ref 12.0–15.0)
MCH: 29.5 pg (ref 26.0–34.0)
MCHC: 33 g/dL (ref 30.0–36.0)
MCV: 89.6 fL (ref 80.0–100.0)
Platelets: 233 10*3/uL (ref 150–400)
RBC: 5.08 MIL/uL (ref 3.87–5.11)
RDW: 13 % (ref 11.5–15.5)
WBC: 14.5 10*3/uL — ABNORMAL HIGH (ref 4.0–10.5)
nRBC: 0 % (ref 0.0–0.2)

## 2021-02-03 LAB — COMPREHENSIVE METABOLIC PANEL
ALT: 35 U/L (ref 0–44)
AST: 54 U/L — ABNORMAL HIGH (ref 15–41)
Albumin: 3.6 g/dL (ref 3.5–5.0)
Alkaline Phosphatase: 108 U/L (ref 38–126)
Anion gap: 8 (ref 5–15)
BUN: 16 mg/dL (ref 8–23)
CO2: 25 mmol/L (ref 22–32)
Calcium: 8.5 mg/dL — ABNORMAL LOW (ref 8.9–10.3)
Chloride: 106 mmol/L (ref 98–111)
Creatinine, Ser: 0.57 mg/dL (ref 0.44–1.00)
GFR, Estimated: >60 mL/min (ref >60)
Glucose, Bld: 104 mg/dL — ABNORMAL HIGH (ref 70–99)
Potassium: 3.7 mmol/L (ref 3.5–5.1)
Sodium: 139 mmol/L (ref 135–145)
Total Bilirubin: 1.8 mg/dL — ABNORMAL HIGH (ref 0.3–1.2)
Total Protein: 7.8 g/dL (ref 6.5–8.1)

## 2021-02-03 LAB — SURGICAL PCR SCREEN
MRSA, PCR: NEGATIVE
Staphylococcus aureus: NEGATIVE

## 2021-02-03 LAB — MAGNESIUM: Magnesium: 2.2 mg/dL (ref 1.7–2.4)

## 2021-02-03 SURGERY — LAPAROSCOPIC CHOLECYSTECTOMY
Anesthesia: General

## 2021-02-03 MED ORDER — SUGAMMADEX SODIUM 200 MG/2ML IV SOLN
INTRAVENOUS | Status: DC | PRN
  Administered 2021-02-03: 300 mg via INTRAVENOUS

## 2021-02-03 MED ORDER — ONDANSETRON HCL 4 MG/2ML IJ SOLN
INTRAMUSCULAR | Status: DC | PRN
  Administered 2021-02-03: 4 mg via INTRAVENOUS

## 2021-02-03 MED ORDER — ONDANSETRON HCL 4 MG/2ML IJ SOLN
INTRAMUSCULAR | Status: AC
  Filled 2021-02-03: qty 2

## 2021-02-03 MED ORDER — KETOROLAC TROMETHAMINE 30 MG/ML IJ SOLN
30.0000 mg | Freq: Four times a day (QID) | INTRAMUSCULAR | Status: AC
  Administered 2021-02-03: 30 mg via INTRAVENOUS

## 2021-02-03 MED ORDER — LIDOCAINE HCL URETHRAL/MUCOSAL 2 % EX GEL
CUTANEOUS | Status: DC | PRN
  Administered 2021-02-03: 1 via INTRATRACHEAL

## 2021-02-03 MED ORDER — FENTANYL CITRATE (PF) 100 MCG/2ML IJ SOLN
INTRAMUSCULAR | Status: AC
  Filled 2021-02-03: qty 4

## 2021-02-03 MED ORDER — HYDROCODONE-ACETAMINOPHEN 5-325 MG PO TABS
1.0000 | ORAL_TABLET | ORAL | Status: DC | PRN
  Administered 2021-02-03 – 2021-02-04 (×3): 1 via ORAL
  Filled 2021-02-03 (×3): qty 1

## 2021-02-03 MED ORDER — KETOROLAC TROMETHAMINE 30 MG/ML IJ SOLN
INTRAMUSCULAR | Status: AC
  Filled 2021-02-03: qty 1

## 2021-02-03 MED ORDER — LIDOCAINE HCL URETHRAL/MUCOSAL 2 % EX GEL
CUTANEOUS | Status: AC
  Filled 2021-02-03: qty 11

## 2021-02-03 MED ORDER — DEXAMETHASONE SODIUM PHOSPHATE 10 MG/ML IJ SOLN
INTRAMUSCULAR | Status: AC
  Filled 2021-02-03: qty 1

## 2021-02-03 MED ORDER — PROPOFOL 10 MG/ML IV BOLUS
INTRAVENOUS | Status: AC
  Filled 2021-02-03: qty 20

## 2021-02-03 MED ORDER — KETOROLAC TROMETHAMINE 30 MG/ML IJ SOLN
30.0000 mg | Freq: Four times a day (QID) | INTRAMUSCULAR | Status: DC | PRN
  Administered 2021-02-03 – 2021-02-04 (×3): 30 mg via INTRAVENOUS
  Filled 2021-02-03 (×3): qty 1

## 2021-02-03 MED ORDER — LIDOCAINE HCL (CARDIAC) PF 100 MG/5ML IV SOSY
PREFILLED_SYRINGE | INTRAVENOUS | Status: DC | PRN
  Administered 2021-02-03: 60 mg via INTRATRACHEAL

## 2021-02-03 MED ORDER — SODIUM CHLORIDE FLUSH 0.9 % IV SOLN
INTRAVENOUS | Status: AC
  Filled 2021-02-03: qty 10

## 2021-02-03 MED ORDER — SODIUM CHLORIDE 0.9 % IR SOLN
Status: DC | PRN
  Administered 2021-02-03: 1

## 2021-02-03 MED ORDER — HEMOSTATIC AGENTS (NO CHARGE) OPTIME
TOPICAL | Status: DC | PRN
  Administered 2021-02-03: 1 via TOPICAL

## 2021-02-03 MED ORDER — LIDOCAINE HCL (PF) 2 % IJ SOLN
INTRAMUSCULAR | Status: AC
  Filled 2021-02-03: qty 5

## 2021-02-03 MED ORDER — BUPIVACAINE LIPOSOME 1.3 % IJ SUSP
INTRAMUSCULAR | Status: DC | PRN
  Administered 2021-02-03: 20 mL

## 2021-02-03 MED ORDER — PROPOFOL 10 MG/ML IV BOLUS
INTRAVENOUS | Status: DC | PRN
  Administered 2021-02-03: 150 mg via INTRAVENOUS

## 2021-02-03 MED ORDER — PANTOPRAZOLE SODIUM 40 MG PO TBEC: 40.0000 mg | DELAYED_RELEASE_TABLET | Freq: Every day | ORAL | Status: DC

## 2021-02-03 MED ORDER — SIMETHICONE 80 MG PO CHEW: 40.0000 mg | CHEWABLE_TABLET | Freq: Four times a day (QID) | ORAL | Status: DC | PRN

## 2021-02-03 MED ORDER — LACTATED RINGERS IV SOLN: INTRAVENOUS | Status: DC | PRN

## 2021-02-03 MED ORDER — PROMETHAZINE HCL 25 MG/ML IJ SOLN
6.2500 mg | INTRAMUSCULAR | Status: DC | PRN
Start: 2021-02-03 — End: 2021-02-03

## 2021-02-03 MED ORDER — HYDROMORPHONE HCL 1 MG/ML IJ SOLN: 0.2500 mg | INTRAMUSCULAR | Status: DC | PRN

## 2021-02-03 MED ORDER — MIDAZOLAM HCL 2 MG/2ML IJ SOLN
INTRAMUSCULAR | Status: AC
  Filled 2021-02-03: qty 2

## 2021-02-03 MED ORDER — ROCURONIUM BROMIDE 10 MG/ML (PF) SYRINGE
PREFILLED_SYRINGE | INTRAVENOUS | Status: DC | PRN
  Administered 2021-02-03: 40 mg via INTRAVENOUS

## 2021-02-03 MED ORDER — MIDAZOLAM HCL 5 MG/5ML IJ SOLN
INTRAMUSCULAR | Status: DC | PRN
  Administered 2021-02-03: 2 mg via INTRAVENOUS

## 2021-02-03 MED ORDER — DEXAMETHASONE SODIUM PHOSPHATE 10 MG/ML IJ SOLN
INTRAMUSCULAR | Status: DC | PRN
  Administered 2021-02-03: 10 mg via INTRAVENOUS

## 2021-02-03 MED ORDER — FENTANYL CITRATE (PF) 100 MCG/2ML IJ SOLN
INTRAMUSCULAR | Status: DC | PRN
  Administered 2021-02-03 (×4): 50 ug via INTRAVENOUS

## 2021-02-03 MED ORDER — BUPIVACAINE LIPOSOME 1.3 % IJ SUSP
INTRAMUSCULAR | Status: AC
  Filled 2021-02-03: qty 20

## 2021-02-03 MED ORDER — SUCCINYLCHOLINE CHLORIDE 200 MG/10ML IV SOSY
PREFILLED_SYRINGE | INTRAVENOUS | Status: DC | PRN
  Administered 2021-02-03: 100 mg via INTRAVENOUS

## 2021-02-03 SURGICAL SUPPLY — 51 items
ADH SKN CLS APL DERMABOND .7 (GAUZE/BANDAGES/DRESSINGS) ×1
APL PRP STRL LF DISP 70% ISPRP (MISCELLANEOUS) ×1
APL SRG 38 LTWT LNG FL B (MISCELLANEOUS)
APPLICATOR ARISTA FLEXITIP XL (MISCELLANEOUS) IMPLANT
APPLIER CLIP ROT 10 11.4 M/L (STAPLE) ×3
APR CLP MED LRG 11.4X10 (STAPLE) ×1
BAG RETRIEVAL 10 (BASKET) ×1
BAG RETRIEVAL 10MM (BASKET) ×1
CHLORAPREP W/TINT 26 (MISCELLANEOUS) ×3 IMPLANT
CLIP APPLIE ROT 10 11.4 M/L (STAPLE) ×1 IMPLANT
CLOTH BEACON ORANGE TIMEOUT ST (SAFETY) ×3 IMPLANT
COVER LIGHT HANDLE STERIS (MISCELLANEOUS) ×6 IMPLANT
DERMABOND ADVANCED (GAUZE/BANDAGES/DRESSINGS) ×2
DERMABOND ADVANCED .7 DNX12 (GAUZE/BANDAGES/DRESSINGS) ×1 IMPLANT
ELECT REM PT RETURN 9FT ADLT (ELECTROSURGICAL) ×3
ELECTRODE REM PT RTRN 9FT ADLT (ELECTROSURGICAL) ×1 IMPLANT
GAUZE 4X4 16PLY ~~LOC~~+RFID DBL (SPONGE) ×3 IMPLANT
GLOVE BIOGEL PI IND STRL 7.5 (GLOVE) ×1 IMPLANT
GLOVE BIOGEL PI INDICATOR 7.5 (GLOVE) ×2
GLOVE EUDERMIC 6.5 POWDERFREE (GLOVE) ×3 IMPLANT
GLOVE SS BIOGEL STRL SZ 6.5 (GLOVE) ×1 IMPLANT
GLOVE SUPERSENSE BIOGEL SZ 6.5 (GLOVE) ×2
GLOVE SURG ENC MOIS LTX SZ6.5 (GLOVE) ×3 IMPLANT
GLOVE SURG POLYISO LF SZ7.5 (GLOVE) ×3 IMPLANT
GLOVE SURG UNDER POLY LF SZ7 (GLOVE) ×6 IMPLANT
GOWN STRL REUS W/TWL LRG LVL3 (GOWN DISPOSABLE) ×9 IMPLANT
HEMOSTAT ARISTA ABSORB 3G PWDR (HEMOSTASIS) IMPLANT
HEMOSTAT SNOW SURGICEL 2X4 (HEMOSTASIS) ×3 IMPLANT
INST SET LAPROSCOPIC AP (KITS) ×3 IMPLANT
KIT TURNOVER KIT A (KITS) ×3 IMPLANT
MANIFOLD NEPTUNE II (INSTRUMENTS) ×3 IMPLANT
NEEDLE HYPO 18GX1.5 BLUNT FILL (NEEDLE) ×3 IMPLANT
NEEDLE HYPO 21X1.5 SAFETY (NEEDLE) ×3 IMPLANT
NEEDLE INSUFFLATION 14GA 120MM (NEEDLE) ×3 IMPLANT
NS IRRIG 1000ML POUR BTL (IV SOLUTION) ×3 IMPLANT
PACK LAP CHOLE LZT030E (CUSTOM PROCEDURE TRAY) ×3 IMPLANT
PAD ARMBOARD 7.5X6 YLW CONV (MISCELLANEOUS) ×3 IMPLANT
SET BASIN LINEN APH (SET/KITS/TRAYS/PACK) ×3 IMPLANT
SET TUBE SMOKE EVAC HIGH FLOW (TUBING) ×3 IMPLANT
SLEEVE ENDOPATH XCEL 5M (ENDOMECHANICALS) ×3 IMPLANT
SUT MNCRL AB 4-0 PS2 18 (SUTURE) ×6 IMPLANT
SUT VICRYL 0 UR6 27IN ABS (SUTURE) ×3 IMPLANT
SYR 20ML LL LF (SYRINGE) ×6 IMPLANT
SYS BAG RETRIEVAL 10MM (BASKET) ×1
SYSTEM BAG RETRIEVAL 10MM (BASKET) ×1 IMPLANT
TROCAR ENDO BLADELESS 11MM (ENDOMECHANICALS) ×3 IMPLANT
TROCAR XCEL NON-BLD 5MMX100MML (ENDOMECHANICALS) ×3 IMPLANT
TROCAR XCEL UNIV SLVE 11M 100M (ENDOMECHANICALS) ×3 IMPLANT
TUBE CONNECTING 12'X1/4 (SUCTIONS) ×1
TUBE CONNECTING 12X1/4 (SUCTIONS) ×2 IMPLANT
WARMER LAPAROSCOPE (MISCELLANEOUS) ×3 IMPLANT

## 2021-02-03 NOTE — Anesthesia Procedure Notes (Signed)
Procedure Name: Intubation Date/Time: 02/03/2021 11:07 AM Performed by: Molli Barrows, MD Pre-anesthesia Checklist: Patient identified, Emergency Drugs available, Suction available and Patient being monitored Patient Re-evaluated:Patient Re-evaluated prior to induction Oxygen Delivery Method: Circle system utilized Preoxygenation: Pre-oxygenation with 100% oxygen Induction Type: IV induction and Rapid sequence Grade View: Grade II Tube type: Oral Tube size: 7.0 mm Number of attempts: 1 Airway Equipment and Method: Stylet Placement Confirmation: ETT inserted through vocal cords under direct vision, positive ETCO2 and breath sounds checked- equal and bilateral Secured at: 20 cm Tube secured with: Tape Dental Injury: Teeth and Oropharynx as per pre-operative assessment

## 2021-02-03 NOTE — Anesthesia Postprocedure Evaluation (Signed)
Anesthesia Post Note  Patient: Janice Wells  Procedure(s) Performed: LAPAROSCOPIC CHOLECYSTECTOMY  Patient location during evaluation: PACU Anesthesia Type: General Level of consciousness: awake and alert Pain management: pain level controlled Vital Signs Assessment: post-procedure vital signs reviewed and stable Respiratory status: spontaneous breathing, nonlabored ventilation, respiratory function stable and patient connected to nasal cannula oxygen Cardiovascular status: blood pressure returned to baseline and stable Postop Assessment: no apparent nausea or vomiting Anesthetic complications: no   No notable events documented.   Last Vitals:  Vitals:   02/03/21 1200 02/03/21 1215  BP: (!) 134/59 127/67  Pulse: 100 92  Resp: 17 16  Temp: 37 C   SpO2: 97% 100%    Last Pain:  Vitals:   02/03/21 1215  TempSrc:   PainSc: 0-No pain                 Gwenette Wellons C Shelvy Perazzo

## 2021-02-03 NOTE — Consult Note (Signed)
Patient seen, labs reviewed.  Discussed surgery with patient through interpreter.  The risks and benefits of the procedure including bleeding, infection, hepatobiliary injury, and the possibility of an open procedure were fully explained to the patient, who gave informed consent.

## 2021-02-03 NOTE — Anesthesia Preprocedure Evaluation (Signed)
Anesthesia Evaluation  Patient identified by MRN, date of birth, ID band Patient awake    Reviewed: Allergy & Precautions, H&P , NPO status , Patient's Chart, lab work & pertinent test results  Airway Mallampati: II  TM Distance: >3 FB Neck ROM: Full    Dental  (+) Dental Advisory Given, Loose, Missing, Poor Dentition,    Pulmonary neg pulmonary ROS,    Pulmonary exam normal breath sounds clear to auscultation       Cardiovascular Exercise Tolerance: Good negative cardio ROS Normal cardiovascular exam Rhythm:Regular Rate:Normal     Neuro/Psych negative neurological ROS  negative psych ROS   GI/Hepatic Neg liver ROS, GERD  ,  Endo/Other  negative endocrine ROS  Renal/GU negative Renal ROS  negative genitourinary   Musculoskeletal negative musculoskeletal ROS (+)   Abdominal   Peds negative pediatric ROS (+)  Hematology negative hematology ROS (+)   Anesthesia Other Findings   Reproductive/Obstetrics negative OB ROS                             Anesthesia Physical Anesthesia Plan  ASA: 2 and emergent  Anesthesia Plan: General   Post-op Pain Management: Dilaudid IV   Induction:   PONV Risk Score and Plan: 4 or greater and Ondansetron, Dexamethasone and Midazolam  Airway Management Planned: Oral ETT  Additional Equipment:   Intra-op Plan:   Post-operative Plan: Extubation in OR  Informed Consent: I have reviewed the patients History and Physical, chart, labs and discussed the procedure including the risks, benefits and alternatives for the proposed anesthesia with the patient or authorized representative who has indicated his/her understanding and acceptance.     Dental advisory given  Plan Discussed with: CRNA and Surgeon  Anesthesia Plan Comments:         Anesthesia Quick Evaluation

## 2021-02-03 NOTE — Op Note (Signed)
Patient:  Janice Wells  DOB:  Jul 24, 1960  MRN:  [id number]   Preop Diagnosis: Acute cholecystitis, cholelithiasis  Postop Diagnosis: Same  Procedure: Laparoscopic cholecystectomy  Surgeon: Franky Macho, MD  Anes: General endotracheal  Indications: Patient is a 60 year old Hispanic female who presents with acute cholecystitis secondary to cholelithiasis.  The risks and benefits of the procedure including bleeding, infection, hepatobiliary injury, and the possibility of an open procedure were fully explained to the patient, who gave informed consent.  This was explained to her through an interpreter.  Procedure note: The patient was placed in the supine position.  After induction of general endotracheal anesthesia, the abdomen was prepped and draped using the usual sterile technique with ChloraPrep.  Surgical site confirmation was performed.  A supraumbilical incision was made down to the fascia.  A Veress needle was introduced into the abdominal cavity and confirmation of placement was done using saline drop test.  The abdomen was then insufflated to 15 mmHg pressure.  An 11 mm trocar was introduced into the abdominal cavity under direct visualization without difficulty.  The patient was placed in reverse Trendelenburg position and an additional 11 mm trocar was placed in the epigastric region and 5 mm trochars were placed the right upper quadrant and right flank regions.  The liver was inspected noted to be within normal limits.  The gallbladder was noted to be distended and edematous.  It was decompressed using suction.  The gallbladder was then retracted in a dynamic fashion in order to provide a critical view of the triangle of Calot.  The cystic duct was first identified.  Its junction to the infundibulum was fully identified.  Endoclips were placed proximally distally on the cystic duct, and the cystic duct was divided.  This was likewise done to the cystic artery.  The  gallbladder was freed away from the gallbladder fossa using Bovie electrocautery.  The gallbladder was delivered through the epigastric trocar site using an Endo Catch bag.  The gallbladder fossa was inspected and no abnormal bleeding or bile leakage was noted.  Surgicel was placed in the gallbladder fossa.  All fluid and air were then evacuated from the abdominal cavity prior to removal of the trochars.  All wounds were irrigated with normal saline.  All wounds were injected with Exparel.  The incisions were closed using a 4-0 Monocryl subcuticular suture.  Dermabond was applied.  All tape and needle counts were correct at the end of the procedure.  The patient was extubated in the operating room and transferred to PACU in stable condition.  Complications: None  EBL: Minimal  Specimen: Gallbladder

## 2021-02-03 NOTE — Transfer of Care (Signed)
Immediate Anesthesia Transfer of Care Note  Patient: Janice Wells  Procedure(s) Performed: LAPAROSCOPIC CHOLECYSTECTOMY  Patient Location: PACU  Anesthesia Type:General  Level of Consciousness: awake, alert , sedated, drowsy and patient cooperative  Airway & Oxygen Therapy: Patient Spontanous Breathing and Patient connected to nasal cannula oxygen  Post-op Assessment: Report given to RN and Post -op Vital signs reviewed and stable  Post vital signs: Reviewed and stable  Last Vitals:  Vitals Value Taken Time  BP 134/59 02/03/21 1200  Temp 37 C 02/03/21 1200  Pulse 96 02/03/21 1204  Resp 16 02/03/21 1204  SpO2 98 % 02/03/21 1204  Vitals shown include unvalidated device data.  Last Pain:  Vitals:   02/03/21 0818  TempSrc:   PainSc: 2          Complications: No notable events documented.

## 2021-02-03 NOTE — Plan of Care (Signed)
  Problem: Coping: Goal: Level of anxiety will decrease Outcome: Progressing   Problem: Pain Managment: Goal: General experience of comfort will improve Outcome: Progressing   Problem: Safety: Goal: Ability to remain free from injury will improve Outcome: Progressing   

## 2021-02-04 LAB — COMPREHENSIVE METABOLIC PANEL
ALT: 46 U/L — ABNORMAL HIGH (ref 0–44)
AST: 44 U/L — ABNORMAL HIGH (ref 15–41)
Albumin: 3 g/dL — ABNORMAL LOW (ref 3.5–5.0)
Alkaline Phosphatase: 98 U/L (ref 38–126)
Anion gap: 8 (ref 5–15)
BUN: 16 mg/dL (ref 8–23)
CO2: 21 mmol/L — ABNORMAL LOW (ref 22–32)
Calcium: 8.2 mg/dL — ABNORMAL LOW (ref 8.9–10.3)
Chloride: 104 mmol/L (ref 98–111)
Creatinine, Ser: 0.59 mg/dL (ref 0.44–1.00)
GFR, Estimated: >60 mL/min (ref >60)
Glucose, Bld: 124 mg/dL — ABNORMAL HIGH (ref 70–99)
Potassium: 3.4 mmol/L — ABNORMAL LOW (ref 3.5–5.1)
Sodium: 133 mmol/L — ABNORMAL LOW (ref 135–145)
Total Bilirubin: 1.2 mg/dL (ref 0.3–1.2)
Total Protein: 6.8 g/dL (ref 6.5–8.1)

## 2021-02-04 LAB — CBC
HCT: 38 % (ref 36.0–46.0)
Hemoglobin: 12.6 g/dL (ref 12.0–15.0)
MCH: 29.5 pg (ref 26.0–34.0)
MCHC: 33.2 g/dL (ref 30.0–36.0)
MCV: 89 fL (ref 80.0–100.0)
Platelets: 188 10*3/uL (ref 150–400)
RBC: 4.27 MIL/uL (ref 3.87–5.11)
RDW: 13.1 % (ref 11.5–15.5)
WBC: 17.5 10*3/uL — ABNORMAL HIGH (ref 4.0–10.5)
nRBC: 0 % (ref 0.0–0.2)

## 2021-02-04 MED ORDER — HYDROCODONE-ACETAMINOPHEN 5-325 MG PO TABS: 1.0000 | ORAL_TABLET | ORAL | 0 refills | Status: DC | PRN

## 2021-02-04 NOTE — Progress Notes (Signed)
Patient given discharge instructions with the assistance of interpreter Rosey Bath [phone]. Patient states understanding of discharge instructions.

## 2021-02-04 NOTE — Discharge Summary (Signed)
Physician Discharge Summary  Patient ID: Janice Wells MRN: [id number] DOB/AGE: 1961/01/31 60 y.o.  Admit date: 02/02/2021 Discharge date: 02/04/2021  Admission Diagnoses: Acute cholecystitis, cholelithiasis  Discharge Diagnoses: Same Principal Problem:   Acute cholecystitis   Discharged Condition: Good  Hospital Course: Patient is a 59 year old Hispanic female who presented to the emergency room with a 1 week history of worsening right upper quadrant abdominal pain, nausea, and vomiting.  CT scan of the abdomen and ultrasound both revealed acute cholecystitis with cholelithiasis.  The patient was admitted by the hospitalist.  The following day, she underwent a laparoscopic cholecystectomy.  She tolerated the surgery well.  Her postoperative course was unremarkable.  Her diet was advanced out difficulty.  Her postoperative labs are within normal limits after undergoing cholecystectomy.  She is being discharged home on 02/04/2021 in good and improving condition.  Treatments: surgery: Laparoscopic cholecystectomy on 02/03/2021  Discharge Exam: Blood pressure 138/70, pulse 74, temperature 98.5 F (36.9 C), temperature source Oral, resp. rate 19, height 4\' 10"  (1.473 m), weight 66.7 kg, last menstrual period 03/11/2008, SpO2 95 %. General appearance: alert, cooperative, and no distress Resp: clear to auscultation bilaterally Cardio: regular rate and rhythm, S1, S2 normal, no murmur, click, rub or gallop GI: Soft, incisions healing well.  Disposition: Discharge disposition: 01-Home or Self Care       Discharge Instructions     Diet - low sodium heart healthy   Complete by: As directed    Increase activity slowly   Complete by: As directed       Allergies as of 02/04/2021   No Known Allergies      Medication List     TAKE these medications    HYDROcodone-acetaminophen 5-325 MG tablet Commonly known as: Norco Take 1 tablet by mouth every 4 (four) hours as  needed for moderate pain.   pantoprazole 40 MG tablet Commonly known as: PROTONIX Take 1 tablet (40 mg total) by mouth daily. Tome una tableta por boca diaria        Follow-up Information     02/06/2021, MD Follow up.   Specialty: General Surgery Why: As needed Contact information: [address] [place] [phone]                 Signed: [phone] 02/04/2021, 9:03 AM

## 2021-02-05 ENCOUNTER — Encounter (HOSPITAL_COMMUNITY): Payer: Self-pay | Admitting: General Surgery

## 2021-02-05 ENCOUNTER — Ambulatory Visit (HOSPITAL_COMMUNITY): Payer: Self-pay

## 2021-02-06 ENCOUNTER — Encounter (HOSPITAL_COMMUNITY): Payer: Self-pay | Admitting: *Deleted

## 2021-02-06 ENCOUNTER — Inpatient Hospital Stay (HOSPITAL_COMMUNITY)
Admission: EM | Admit: 2021-02-06 | Discharge: 2021-02-09 | DRG: 388 | Disposition: A | Discharge status: Home / Self Care | Payer: Self-pay | Attending: General Surgery | Admitting: General Surgery

## 2021-02-06 ENCOUNTER — Emergency Department (HOSPITAL_COMMUNITY): Payer: Self-pay

## 2021-02-06 DIAGNOSIS — K567 Ileus, unspecified: Principal | ICD-10-CM | POA: Diagnosis present

## 2021-02-06 DIAGNOSIS — E876 Hypokalemia: Secondary | ICD-10-CM | POA: Diagnosis present

## 2021-02-06 DIAGNOSIS — R112 Nausea with vomiting, unspecified: Secondary | ICD-10-CM | POA: Diagnosis present

## 2021-02-06 DIAGNOSIS — K859 Acute pancreatitis without necrosis or infection, unspecified: Secondary | ICD-10-CM | POA: Diagnosis present

## 2021-02-06 DIAGNOSIS — J45909 Unspecified asthma, uncomplicated: Secondary | ICD-10-CM | POA: Diagnosis present

## 2021-02-06 DIAGNOSIS — Z20822 Contact with and (suspected) exposure to covid-19: Secondary | ICD-10-CM | POA: Diagnosis present

## 2021-02-06 DIAGNOSIS — G8918 Other acute postprocedural pain: Secondary | ICD-10-CM

## 2021-02-06 DIAGNOSIS — K219 Gastro-esophageal reflux disease without esophagitis: Secondary | ICD-10-CM | POA: Diagnosis present

## 2021-02-06 DIAGNOSIS — R1084 Generalized abdominal pain: Secondary | ICD-10-CM

## 2021-02-06 DIAGNOSIS — K56609 Unspecified intestinal obstruction, unspecified as to partial versus complete obstruction: Secondary | ICD-10-CM

## 2021-02-06 DIAGNOSIS — Z9049 Acquired absence of other specified parts of digestive tract: Secondary | ICD-10-CM

## 2021-02-06 DIAGNOSIS — Z833 Family history of diabetes mellitus: Secondary | ICD-10-CM

## 2021-02-06 LAB — COMPREHENSIVE METABOLIC PANEL
ALT: 38 U/L (ref 0–44)
AST: 42 U/L — ABNORMAL HIGH (ref 15–41)
Albumin: 3.4 g/dL — ABNORMAL LOW (ref 3.5–5.0)
Alkaline Phosphatase: 111 U/L (ref 38–126)
Anion gap: 16 — ABNORMAL HIGH (ref 5–15)
BUN: 32 mg/dL — ABNORMAL HIGH (ref 8–23)
CO2: 24 mmol/L (ref 22–32)
Calcium: 8.9 mg/dL (ref 8.9–10.3)
Chloride: 96 mmol/L — ABNORMAL LOW (ref 98–111)
Creatinine, Ser: 0.75 mg/dL (ref 0.44–1.00)
GFR, Estimated: >60 mL/min (ref >60)
Glucose, Bld: 112 mg/dL — ABNORMAL HIGH (ref 70–99)
Potassium: 3.3 mmol/L — ABNORMAL LOW (ref 3.5–5.1)
Sodium: 136 mmol/L (ref 135–145)
Total Bilirubin: 1.3 mg/dL — ABNORMAL HIGH (ref 0.3–1.2)
Total Protein: 7.9 g/dL (ref 6.5–8.1)

## 2021-02-06 LAB — CBC WITH DIFFERENTIAL/PLATELET
Abs Immature Granulocytes: 0.07 10*3/uL (ref 0.00–0.07)
Basophils Absolute: 0 10*3/uL (ref 0.0–0.1)
Basophils Relative: 0 %
Eosinophils Absolute: 0 10*3/uL (ref 0.0–0.5)
Eosinophils Relative: 0 %
HCT: 44 % (ref 36.0–46.0)
Hemoglobin: 15.1 g/dL — ABNORMAL HIGH (ref 12.0–15.0)
Immature Granulocytes: 1 %
Lymphocytes Relative: 12 %
Lymphs Abs: 1.2 10*3/uL (ref 0.7–4.0)
MCH: 29.4 pg (ref 26.0–34.0)
MCHC: 34.3 g/dL (ref 30.0–36.0)
MCV: 85.8 fL (ref 80.0–100.0)
Monocytes Absolute: 0.8 10*3/uL (ref 0.1–1.0)
Monocytes Relative: 9 %
Neutro Abs: 7.7 10*3/uL (ref 1.7–7.7)
Neutrophils Relative %: 78 %
Platelets: 258 10*3/uL (ref 150–400)
RBC: 5.13 MIL/uL — ABNORMAL HIGH (ref 3.87–5.11)
RDW: 12.6 % (ref 11.5–15.5)
WBC: 9.8 10*3/uL (ref 4.0–10.5)
nRBC: 0 % (ref 0.0–0.2)

## 2021-02-06 LAB — LIPASE, BLOOD: Lipase: 131 U/L — ABNORMAL HIGH (ref 11–51)

## 2021-02-06 LAB — RESP PANEL BY RT-PCR (FLU A&B, COVID) ARPGX2
Influenza A by PCR: NEGATIVE
Influenza B by PCR: NEGATIVE
SARS Coronavirus 2 by RT PCR: NEGATIVE

## 2021-02-06 LAB — SURGICAL PATHOLOGY

## 2021-02-06 MED ORDER — METOCLOPRAMIDE HCL 5 MG/ML IJ SOLN
INTRAMUSCULAR | Status: AC
  Administered 2021-02-07: 10 mg via INTRAVENOUS
  Filled 2021-02-06: qty 2

## 2021-02-06 MED ORDER — SODIUM CHLORIDE 0.9 % IV BOLUS
1000.0000 mL | Freq: Once | INTRAVENOUS | Status: AC
  Administered 2021-02-06: 1000 mL via INTRAVENOUS

## 2021-02-06 MED ORDER — ONDANSETRON HCL 4 MG/2ML IJ SOLN
4.0000 mg | Freq: Once | INTRAMUSCULAR | Status: AC
  Administered 2021-02-06: 4 mg via INTRAVENOUS

## 2021-02-06 MED ORDER — FENTANYL CITRATE PF 50 MCG/ML IJ SOSY
50.0000 ug | PREFILLED_SYRINGE | Freq: Once | INTRAMUSCULAR | Status: AC
  Administered 2021-02-06: 50 ug via INTRAVENOUS

## 2021-02-06 MED ORDER — METOCLOPRAMIDE HCL 5 MG/ML IJ SOLN
10.0000 mg | Freq: Once | INTRAMUSCULAR | Status: AC
  Administered 2021-02-06: 10 mg via INTRAVENOUS

## 2021-02-06 NOTE — ED Triage Notes (Signed)
Abdominal pain with vomiting after surgery

## 2021-02-06 NOTE — ED Provider Notes (Signed)
[facility] EMERGENCY DEPARTMENT Provider Note   CSN: [id number] Arrival date & time: 02/06/21  1701     History Chief Complaint  Patient presents with   Abdominal Pain    Janice Wells is a 60 y.o. female.  Presented to the hospital for abdominal pain.  Recent admission for acute cholecystitis and underwent cholecystectomy.  Patient reports over the past couple days has had worsening abdominal pain nausea and vomiting.  Unable to keep any p.o. down.  No associated fevers.  Vomit is nonbloody.  Not passing any gas today, no BM today.  Pain in abdomen is generalized, moderate to severe, aching, no alleviating or aggravating factors.  Utilized Electronics engineer throughout visit.  HPI     History reviewed. No pertinent past medical history.  Patient Active Problem List   Diagnosis Date Noted   Intractable nausea and vomiting 02/06/2021   Acute cholecystitis 02/02/2021   Gastroesophageal reflux disease 11/27/2020   Obesity, unspecified 02/15/2015    Past Surgical History:  Procedure Laterality Date   CHOLECYSTECTOMY N/A 02/03/2021   Procedure: LAPAROSCOPIC CHOLECYSTECTOMY;  Surgeon: Franky Macho, MD;  Location: [facility];  Service: General;  Laterality: N/A;     OB History     Gravida  0   Para  0   Term  0   Preterm  0   AB  0   Living         SAB  0   IAB  0   Ectopic  0   Multiple      Live Births              Family History  Problem Relation Age of Onset   Diabetes Sister    Cancer Brother     Social History   Tobacco Use   Smoking status: Never   Smokeless tobacco: Never  Substance Use Topics   Alcohol use: No   Drug use: No    Home Medications Prior to Admission medications   Medication Sig Start Date End Date Taking? Authorizing Provider  HYDROcodone-acetaminophen (NORCO) 5-325 MG tablet Take 1 tablet by mouth every 4 (four) hours as needed for moderate pain. 02/04/21  Yes Franky Macho, MD  pantoprazole  (PROTONIX) 40 MG tablet Take 1 tablet (40 mg total) by mouth daily. Tome una tableta por boca diaria Patient not taking: Reported on 02/06/2021 01/22/21   Jacquelin Hawking, PA-C    Allergies    Patient has no known allergies.  Review of Systems   Review of Systems  Constitutional:  Negative for chills and fever.  HENT:  Negative for ear pain and sore throat.   Eyes:  Negative for pain and visual disturbance.  Respiratory:  Negative for cough and shortness of breath.   Cardiovascular:  Negative for chest pain and palpitations.  Gastrointestinal:  Positive for abdominal pain, constipation, nausea and vomiting.  Genitourinary:  Negative for dysuria and hematuria.  Musculoskeletal:  Negative for arthralgias and back pain.  Skin:  Negative for color change and rash.  Neurological:  Negative for seizures and syncope.  All other systems reviewed and are negative.  Physical Exam Updated Vital Signs BP 137/81   Pulse 91   Temp 98.6 F (37 C) (Oral)   Resp 16   LMP 03/11/2008 (Approximate)   SpO2 97%   Physical Exam Vitals and nursing note reviewed.  Constitutional:      General: She is not in acute distress.    Appearance: She is well-developed.  HENT:  Head: Normocephalic and atraumatic.  Eyes:     Conjunctiva/sclera: Conjunctivae normal.  Cardiovascular:     Rate and Rhythm: Normal rate and regular rhythm.     Heart sounds: No murmur heard. Pulmonary:     Effort: Pulmonary effort is normal. No respiratory distress.     Breath sounds: Normal breath sounds.  Abdominal:     Palpations: Abdomen is soft.     Tenderness: There is no abdominal tenderness.     Comments: Laparoscopic incision sites are intact, there is generalized tenderness but no rebound or guarding  Musculoskeletal:        General: No swelling.     Cervical back: Neck supple.  Skin:    General: Skin is warm and dry.     Capillary Refill: Capillary refill takes less than 2 seconds.  Neurological:      Mental Status: She is alert.  Psychiatric:        Mood and Affect: Mood normal.    ED Results / Procedures / Treatments   Labs (all labs ordered are listed, but only abnormal results are displayed) Labs Reviewed  CBC WITH DIFFERENTIAL/PLATELET - Abnormal; Notable for the following components:      Result Value   RBC 5.13 (*)    Hemoglobin 15.1 (*)    All other components within normal limits  COMPREHENSIVE METABOLIC PANEL - Abnormal; Notable for the following components:   Potassium 3.3 (*)    Chloride 96 (*)    Glucose, Bld 112 (*)    BUN 32 (*)    Albumin 3.4 (*)    AST 42 (*)    Total Bilirubin 1.3 (*)    Anion gap 16 (*)    All other components within normal limits  LIPASE, BLOOD - Abnormal; Notable for the following components:   Lipase 131 (*)    All other components within normal limits  RESP PANEL BY RT-PCR (FLU A&B, COVID) ARPGX2    EKG None  Radiology DG Abdomen Acute W/Chest  Result Date: 02/06/2021 CLINICAL DATA:  Abdominal pain and vomiting. EXAM: DG ABDOMEN ACUTE WITH 1 VIEW CHEST COMPARISON:  None. FINDINGS: There is some dilated small bowel loops in the left abdomen measuring up to 4 cm containing air-fluid levels. Air seen throughout nondilated colon to the level the rectum. Cholecystectomy clips are present. There is also air-fluid level in the stomach. There are no suspicious calcifications. There is no free air under the diaphragm. There are minimal atelectatic changes in the lung bases. The lungs are otherwise clear. There is no pleural effusion or pneumothorax. Cardiomediastinal silhouette is within normal limits. No acute fractures are seen. IMPRESSION: 1. Dilated small bowel with air-fluid levels with air-fluid level in the stomach suspicious for small bowel obstruction. 2. Bibasilar atelectasis. Electronically Signed   By: Ronney Asters M.D.   On: 02/06/2021 18:39    Procedures Procedures   Medications Ordered in ED Medications  metoCLOPramide  (REGLAN) 5 MG/ML injection (has no administration in time range)  fentaNYL (SUBLIMAZE) injection 50 mcg (50 mcg Intravenous Given 02/06/21 2001)  ondansetron (ZOFRAN) injection 4 mg (4 mg Intravenous Given 02/06/21 2002)  metoCLOPramide (REGLAN) injection 10 mg (10 mg Intravenous Given 02/06/21 2249)  sodium chloride 0.9 % bolus 1,000 mL (0 mLs Intravenous Stopped 02/06/21 2358)    ED Course  I have reviewed the triage vital signs and the nursing notes.  Pertinent labs & imaging results that were available during my care of the patient were reviewed by me  and considered in my medical decision making (see chart for details).    MDM Rules/Calculators/A&P                          60 year old lady presents to ER with concern for generalized abdominal pain.  Cholecystectomy for cholecystitis on 11/26.  Basic lab work is stable, mild elevation in lipase.  CT scan not currently available at this facility.  X-ray noted for possible small bowel obstruction.  Discussed case with Dr. Arnoldo Morale, he feels that SBO is less likely given patient is only a couple days postoperative and feels symptoms are most likely from expected postoperative course.  Given patient still is having ongoing pain and nausea despite multiple doses of pain meds and antiemetics.  Discussed again with Dr. Arnoldo Morale who recommended admission to the hospitalist service.  He will see as consult in the morning.  Discussed with Dr. Cyd Silence who will admit.   Final Clinical Impression(s) / ED Diagnoses Final diagnoses:  Generalized abdominal pain  Post-op pain    Rx / DC Orders ED Discharge Orders     None        Lucrezia Starch, MD 02/07/21 [phone]

## 2021-02-07 ENCOUNTER — Observation Stay (HOSPITAL_COMMUNITY): Payer: Self-pay

## 2021-02-07 ENCOUNTER — Other Ambulatory Visit: Payer: Self-pay

## 2021-02-07 ENCOUNTER — Encounter (HOSPITAL_COMMUNITY): Payer: Self-pay | Admitting: Internal Medicine

## 2021-02-07 DIAGNOSIS — R1084 Generalized abdominal pain: Secondary | ICD-10-CM | POA: Diagnosis present

## 2021-02-07 DIAGNOSIS — R112 Nausea with vomiting, unspecified: Secondary | ICD-10-CM | POA: Diagnosis present

## 2021-02-07 DIAGNOSIS — E876 Hypokalemia: Secondary | ICD-10-CM | POA: Diagnosis present

## 2021-02-07 DIAGNOSIS — G8918 Other acute postprocedural pain: Secondary | ICD-10-CM

## 2021-02-07 DIAGNOSIS — K567 Ileus, unspecified: Principal | ICD-10-CM

## 2021-02-07 LAB — MAGNESIUM: Magnesium: 2.3 mg/dL (ref 1.7–2.4)

## 2021-02-07 LAB — CBC WITH DIFFERENTIAL/PLATELET
Abs Immature Granulocytes: 0.07 10*3/uL (ref 0.00–0.07)
Basophils Absolute: 0 10*3/uL (ref 0.0–0.1)
Basophils Relative: 0 %
Eosinophils Absolute: 0.1 10*3/uL (ref 0.0–0.5)
Eosinophils Relative: 1 %
HCT: 41 % (ref 36.0–46.0)
Hemoglobin: 13.6 g/dL (ref 12.0–15.0)
Immature Granulocytes: 1 %
Lymphocytes Relative: 10 %
Lymphs Abs: 1.1 10*3/uL (ref 0.7–4.0)
MCH: 29.1 pg (ref 26.0–34.0)
MCHC: 33.2 g/dL (ref 30.0–36.0)
MCV: 87.6 fL (ref 80.0–100.0)
Monocytes Absolute: 1 10*3/uL (ref 0.1–1.0)
Monocytes Relative: 9 %
Neutro Abs: 8.8 10*3/uL — ABNORMAL HIGH (ref 1.7–7.7)
Neutrophils Relative %: 79 %
Platelets: 262 10*3/uL (ref 150–400)
RBC: 4.68 MIL/uL (ref 3.87–5.11)
RDW: 12.8 % (ref 11.5–15.5)
WBC: 11.1 10*3/uL — ABNORMAL HIGH (ref 4.0–10.5)
nRBC: 0 % (ref 0.0–0.2)

## 2021-02-07 LAB — COMPREHENSIVE METABOLIC PANEL
ALT: 33 U/L (ref 0–44)
AST: 31 U/L (ref 15–41)
Albumin: 3.1 g/dL — ABNORMAL LOW (ref 3.5–5.0)
Alkaline Phosphatase: 96 U/L (ref 38–126)
Anion gap: 11 (ref 5–15)
BUN: 29 mg/dL — ABNORMAL HIGH (ref 8–23)
CO2: 22 mmol/L (ref 22–32)
Calcium: 8 mg/dL — ABNORMAL LOW (ref 8.9–10.3)
Chloride: 102 mmol/L (ref 98–111)
Creatinine, Ser: 0.58 mg/dL (ref 0.44–1.00)
GFR, Estimated: >60 mL/min (ref >60)
Glucose, Bld: 107 mg/dL — ABNORMAL HIGH (ref 70–99)
Potassium: 3.4 mmol/L — ABNORMAL LOW (ref 3.5–5.1)
Sodium: 135 mmol/L (ref 135–145)
Total Bilirubin: 0.8 mg/dL (ref 0.3–1.2)
Total Protein: 7.5 g/dL (ref 6.5–8.1)

## 2021-02-07 LAB — LIPASE, BLOOD: Lipase: 132 U/L — ABNORMAL HIGH (ref 11–51)

## 2021-02-07 MED ORDER — ACETAMINOPHEN 325 MG PO TABS: 650.0000 mg | ORAL_TABLET | Freq: Four times a day (QID) | ORAL | Status: DC | PRN

## 2021-02-07 MED ORDER — POTASSIUM CHLORIDE IN NACL 40-0.9 MEQ/L-% IV SOLN: INTRAVENOUS | Status: AC

## 2021-02-07 MED ORDER — POLYETHYLENE GLYCOL 3350 17 G PO PACK: 17.0000 g | PACK | Freq: Every day | ORAL | Status: DC | PRN

## 2021-02-07 MED ORDER — POLYETHYLENE GLYCOL 3350 17 G PO PACK
17.0000 g | PACK | Freq: Two times a day (BID) | ORAL | Status: DC
  Administered 2021-02-07 – 2021-02-09 (×5): 17 g via ORAL
  Filled 2021-02-07 (×5): qty 1

## 2021-02-07 MED ORDER — MORPHINE SULFATE (PF) 2 MG/ML IV SOLN
2.0000 mg | INTRAVENOUS | Status: DC | PRN
  Administered 2021-02-07: 2 mg via INTRAVENOUS
  Filled 2021-02-07: qty 1

## 2021-02-07 MED ORDER — PANTOPRAZOLE SODIUM 40 MG PO TBEC: 40.0000 mg | DELAYED_RELEASE_TABLET | Freq: Two times a day (BID) | ORAL | Status: DC

## 2021-02-07 MED ORDER — ONDANSETRON HCL 4 MG PO TABS: 4.0000 mg | ORAL_TABLET | Freq: Four times a day (QID) | ORAL | Status: DC | PRN

## 2021-02-07 MED ORDER — PANTOPRAZOLE SODIUM 40 MG IV SOLR
40.0000 mg | Freq: Once | INTRAVENOUS | Status: AC
  Administered 2021-02-07: 40 mg via INTRAVENOUS
  Filled 2021-02-07: qty 40

## 2021-02-07 MED ORDER — ONDANSETRON HCL 4 MG/2ML IJ SOLN
4.0000 mg | Freq: Four times a day (QID) | INTRAMUSCULAR | Status: DC | PRN
  Administered 2021-02-07: 4 mg via INTRAVENOUS
  Filled 2021-02-07: qty 2

## 2021-02-07 MED ORDER — ACETAMINOPHEN 650 MG RE SUPP: 650.0000 mg | Freq: Four times a day (QID) | RECTAL | Status: DC | PRN

## 2021-02-07 MED ORDER — HYDROCODONE-ACETAMINOPHEN 7.5-325 MG PO TABS
1.0000 | ORAL_TABLET | Freq: Four times a day (QID) | ORAL | Status: DC | PRN
  Administered 2021-02-07: 1 via ORAL
  Filled 2021-02-07: qty 1

## 2021-02-07 MED ORDER — MORPHINE SULFATE (PF) 4 MG/ML IV SOLN
4.0000 mg | INTRAVENOUS | Status: DC | PRN
  Administered 2021-02-07: 4 mg via INTRAVENOUS
  Filled 2021-02-07: qty 1

## 2021-02-07 MED ORDER — POTASSIUM CHLORIDE 2 MEQ/ML IV SOLN
INTRAVENOUS | Status: DC
  Filled 2021-02-07: qty 1000

## 2021-02-07 MED ORDER — BISACODYL 10 MG RE SUPP
10.0000 mg | Freq: Two times a day (BID) | RECTAL | Status: DC
  Administered 2021-02-07 – 2021-02-09 (×5): 10 mg via RECTAL
  Filled 2021-02-07 (×5): qty 1

## 2021-02-07 MED ORDER — TECHNETIUM TC 99M MEBROFENIN IV KIT
5.0000 | PACK | Freq: Once | INTRAVENOUS | Status: AC | PRN
  Administered 2021-02-07: 5.5 via INTRAVENOUS

## 2021-02-07 MED ORDER — POTASSIUM CHLORIDE 2 MEQ/ML IV SOLN
INTRAVENOUS | Status: DC
  Filled 2021-02-07 (×12): qty 1000

## 2021-02-07 MED ORDER — PANTOPRAZOLE SODIUM 40 MG IV SOLR
40.0000 mg | INTRAVENOUS | Status: DC
  Administered 2021-02-07 – 2021-02-08 (×2): 40 mg via INTRAVENOUS
  Filled 2021-02-07 (×2): qty 40

## 2021-02-07 NOTE — Progress Notes (Signed)
PROGRESS NOTE    Janice Wells  BSJ:[id number] DOB: [birth year] DOA: 02/06/2021 PCP: Jacquelin Hawking, PA-C    Chief Complaint  Patient presents with   Abdominal Pain    Brief Narrative:  As per H&P written by Dr. Leafy Half on 02/07/2019 60 year old spanish speaking female with past medical history of asthma gastroesophageal reflux disease and recent diagnosis of cholecystitis status post cholecystectomy on 11/26 who [facility] with nausea vomiting abdominal pain.   Patient explains that since her discharge from the hospital 11/27 she has continued to have generalized abdominal pain.  Patient describes his abdominal pain is sharp in quality, generalized, moderate to severe in intensity, worse with movement and improved with rest.  Patient additionally complains of nausea, vomiting and inability to tolerate oral intake.  Patient has only been able to tolerate occasional liquids.  Patient reports that she has not passed any flatus or moved her bowels since the surgery.   Since 11/27 due to patient's ongoing symptoms patient is developed grossly worsening generalized weakness and malaise prompting her to present to [facility] emergency department for evaluation.   Upon evaluation in emergency department patient's abdomen was noted to be tender but soft.  X-ray imaging of the abdomen was performed revealing possible small bowel obstruction.  ER provider then discussed case with Dr. Lovell Sheehan general surgery who reviewed the x-ray and disagreed with the small bowel obstruction assessment.  He said that if the patient was admitted to the hospitalist service he would evaluate patient in consultation the following morning.  Patient's symptoms continue to persist despite administration of both fentanyl Zofran and Reglan.  Due to patient's intractable symptoms and inability to tolerate oral intake the hospitalist group has been contacted to assess  patient for admission to the hospital.  Assessment & Plan: 1-abdominal pain, nausea/vomiting: In the setting of ileus/partial SBO. -No availability of CT scan currently -Following general surgery recommendations HIDA scan has been ordered demonstrating no biliary leak. -Care has been transition to general surgery service for further management of ileus in the setting of recent laparoscopic intervention. -Continue IV fluids, electrolyte repletion, as needed antiemetics and analgesics. -Plan is to have diet advance as tolerated and the use of cathartics. -Internal medicine will remain available for any needs.  2-gastroesophageal reflux disease -Continue PPI.  3-hypokalemia -Continue electrolyte repletion.   DVT prophylaxis: SCDs Code Status: Full code Family Communication: Son at bedside. Disposition:   Status is: Inpatient  Remains inpatient appropriate because: Having trouble tolerating orals requiring IV fluid.    Consultants:  General surgery  Procedures:  See below for x-ray reports.  Antimicrobials:  None   Subjective: Reports ongoing abdominal discomfort and nausea; no further vomiting.  Feels a slightly better.  Still no bowel movements.  Objective: Vitals:   02/07/21 0201 02/07/21 0553 02/07/21 1000 02/07/21 1325  BP: 138/79 (!) 143/72 133/75 (!) 151/78  Pulse: 96 92 90 87  Resp: 19 19 20 20   Temp: 98 F (36.7 C) 98 F (36.7 C) 98.3 F (36.8 C) 98.4 F (36.9 C)  TempSrc:   Oral Oral  SpO2: 96% 98% 100% 100%  Weight: 62.1 kg     Height: 4\' 10"  (1.473 m)       Intake/Output Summary (Last 24 hours) at 02/07/2021 1811 Last data filed at 02/07/2021 1300 Gross per 24 hour  Intake 1427.02 ml  Output --  Net 1427.02 ml   Filed Weights   02/07/21 0201  Weight: 62.1 kg  Examination:  General exam: A no chest pain, no shortness of breath, still reporting intermittent abdominal discomfort.  Slightly nauseous but no further vomiting since  medications given last night. Respiratory system: Clear to auscultation. Respiratory effort normal.  No requiring oxygen supplementation. Cardiovascular system: S1 & S2 heard, RRR. No JVD, murmurs, rubs, gallops or clicks. No pedal edema. Gastrointestinal system: Abdomen is tender to palpation, decreased but present bowel sounds on examination; no guarding.  Well-healing incisions from recent laparoscopic intervention. Central nervous system: Alert and oriented. No focal neurological deficits. Extremities: No cyanosis, no clubbing, no edema. Skin: No petechiae. Psychiatry: Judgement and insight appear normal. Mood & affect appropriate.     Data Reviewed: I have personally reviewed following labs and imaging studies  CBC: Recent Labs  Lab 02/02/21 0935 02/03/21 0515 02/04/21 0420 02/06/21 1759 02/07/21 0514  WBC 12.7* 14.5* 17.5* 9.8 11.1*  NEUTROABS 10.2*  --   --  7.7 8.8*  HGB 15.0 15.0 12.6 15.1* 13.6  HCT 44.6 45.5 38.0 44.0 41.0  MCV 88.1 89.6 89.0 85.8 87.6  PLT 258 233 188 258 262    Basic Metabolic Panel: Recent Labs  Lab 02/02/21 0935 02/03/21 0515 02/04/21 0420 02/06/21 1759 02/07/21 0514  NA 138 139 133* 136 135  K 3.6 3.7 3.4* 3.3* 3.4*  CL 101 106 104 96* 102  CO2 23 25 21* 24 22  GLUCOSE 121* 104* 124* 112* 107*  BUN 26* 16 16 32* 29*  CREATININE 0.77 0.57 0.59 0.75 0.58  CALCIUM 9.0 8.5* 8.2* 8.9 8.0*  MG  --  2.2  --   --  2.3    GFR: Estimated Creatinine Clearance: 56.9 mL/min (by C-G formula based on SCr of 0.58 mg/dL).  Liver Function Tests: Recent Labs  Lab 02/02/21 0935 02/03/21 0515 02/04/21 0420 02/06/21 1759 02/07/21 0514  AST 19 54* 44* 42* 31  ALT 14 35 46* 38 33  ALKPHOS 111 108 98 111 96  BILITOT 0.7 1.8* 1.2 1.3* 0.8  PROT 8.6* 7.8 6.8 7.9 7.5  ALBUMIN 4.1 3.6 3.0* 3.4* 3.1*    CBG: No results for input(s): GLUCAP in the last 168 hours.   Recent Results (from the past 240 hour(s))  Resp Panel by RT-PCR (Flu A&B,  Covid) Nasopharyngeal Swab     Status: None   Collection Time: 02/02/21  1:20 PM   Specimen: Nasopharyngeal Swab; Nasopharyngeal(NP) swabs in vial transport medium  Result Value Ref Range Status   SARS Coronavirus 2 by RT PCR NEGATIVE NEGATIVE Final    Comment: (NOTE) SARS-CoV-2 target nucleic acids are NOT DETECTED.  The SARS-CoV-2 RNA is generally detectable in upper respiratory specimens during the acute phase of infection. The lowest concentration of SARS-CoV-2 viral copies this assay can detect is 138 copies/mL. A negative result does not preclude SARS-Cov-2 infection and should not be used as the sole basis for treatment or other patient management decisions. A negative result may occur with  improper specimen collection/handling, submission of specimen other than nasopharyngeal swab, presence of viral mutation(s) within the areas targeted by this assay, and inadequate number of viral copies(<138 copies/mL). A negative result must be combined with clinical observations, patient history, and epidemiological information. The expected result is Negative.  Fact Sheet for Patients:  BloggerCourse.com  Fact Sheet for Healthcare Providers:  SeriousBroker.it  This test is no t yet approved or cleared by the [place] FDA and  has been authorized for detection and/or diagnosis of SARS-CoV-2 by FDA under an Emergency  Use Authorization (EUA). This EUA will remain  in effect (meaning this test can be used) for the duration of the COVID-19 declaration under Section 564(b)(1) of the Act, 21 U.S.C.section 360bbb-3(b)(1), unless the authorization is terminated  or revoked sooner.       Influenza A by PCR NEGATIVE NEGATIVE Final   Influenza B by PCR NEGATIVE NEGATIVE Final    Comment: (NOTE) The Xpert Xpress SARS-CoV-2/FLU/RSV plus assay is intended as an aid in the diagnosis of influenza from Nasopharyngeal swab specimens and should  not be used as a sole basis for treatment. Nasal washings and aspirates are unacceptable for Xpert Xpress SARS-CoV-2/FLU/RSV testing.  Fact Sheet for Patients: BloggerCourse.com  Fact Sheet for Healthcare Providers: SeriousBroker.it  This test is not yet approved or cleared by the [place] FDA and has been authorized for detection and/or diagnosis of SARS-CoV-2 by FDA under an Emergency Use Authorization (EUA). This EUA will remain in effect (meaning this test can be used) for the duration of the COVID-19 declaration under Section 564(b)(1) of the Act, 21 U.S.C. section 360bbb-3(b)(1), unless the authorization is terminated or revoked.  Performed at [facility], [address], [place], [place] [address]   Surgical PCR screen     Status: None   Collection Time: 02/02/21 10:52 PM   Specimen: Nasal Mucosa; Nasal Swab  Result Value Ref Range Status   MRSA, PCR NEGATIVE NEGATIVE Final   Staphylococcus aureus NEGATIVE NEGATIVE Final    Comment: (NOTE) The Xpert SA Assay (FDA approved for NASAL specimens in patients 47 years of age and older), is one component of a comprehensive surveillance program. It is not intended to diagnose infection nor to guide or monitor treatment. Performed at [facility], [address] [place], [place] [address]   Resp Panel by RT-PCR (Flu A&B, Covid) Nasopharyngeal Swab     Status: None   Collection Time: 02/06/21  8:09 PM   Specimen: Nasopharyngeal Swab; Nasopharyngeal(NP) swabs in vial transport medium  Result Value Ref Range Status   SARS Coronavirus 2 by RT PCR NEGATIVE NEGATIVE Final    Comment: (NOTE) SARS-CoV-2 target nucleic acids are NOT DETECTED.  The SARS-CoV-2 RNA is generally detectable in upper respiratory specimens during the acute phase of infection. The lowest concentration of SARS-CoV-2 viral copies this assay can detect is 138 copies/mL. A negative result does not preclude  SARS-Cov-2 infection and should not be used as the sole basis for treatment or other patient management decisions. A negative result may occur with  improper specimen collection/handling, submission of specimen other than nasopharyngeal swab, presence of viral mutation(s) within the areas targeted by this assay, and inadequate number of viral copies(<138 copies/mL). A negative result must be combined with clinical observations, patient history, and epidemiological information. The expected result is Negative.  Fact Sheet for Patients:  BloggerCourse.com  Fact Sheet for Healthcare Providers:  SeriousBroker.it  This test is no t yet approved or cleared by the [place] FDA and  has been authorized for detection and/or diagnosis of SARS-CoV-2 by FDA under an Emergency Use Authorization (EUA). This EUA will remain  in effect (meaning this test can be used) for the duration of the COVID-19 declaration under Section 564(b)(1) of the Act, 21 U.S.C.section 360bbb-3(b)(1), unless the authorization is terminated  or revoked sooner.       Influenza A by PCR NEGATIVE NEGATIVE Final   Influenza B by PCR NEGATIVE NEGATIVE Final    Comment: (NOTE) The Xpert Xpress SARS-CoV-2/FLU/RSV plus assay is intended  as an aid in the diagnosis of influenza from Nasopharyngeal swab specimens and should not be used as a sole basis for treatment. Nasal washings and aspirates are unacceptable for Xpert Xpress SARS-CoV-2/FLU/RSV testing.  Fact Sheet for Patients: BloggerCourse.com  Fact Sheet for Healthcare Providers: SeriousBroker.it  This test is not yet approved or cleared by the [place] FDA and has been authorized for detection and/or diagnosis of SARS-CoV-2 by FDA under an Emergency Use Authorization (EUA). This EUA will remain in effect (meaning this test can be used) for the duration of  the COVID-19 declaration under Section 564(b)(1) of the Act, 21 U.S.C. section 360bbb-3(b)(1), unless the authorization is terminated or revoked.  Performed at [facility], [address] [place], [place] [address]      Radiology Studies: DG Abdomen Acute W/Chest  Result Date: 02/06/2021 CLINICAL DATA:  Abdominal pain and vomiting. EXAM: DG ABDOMEN ACUTE WITH 1 VIEW CHEST COMPARISON:  None. FINDINGS: There is some dilated small bowel loops in the left abdomen measuring up to 4 cm containing air-fluid levels. Air seen throughout nondilated colon to the level the rectum. Cholecystectomy clips are present. There is also air-fluid level in the stomach. There are no suspicious calcifications. There is no free air under the diaphragm. There are minimal atelectatic changes in the lung bases. The lungs are otherwise clear. There is no pleural effusion or pneumothorax. Cardiomediastinal silhouette is within normal limits. No acute fractures are seen. IMPRESSION: 1. Dilated small bowel with air-fluid levels with air-fluid level in the stomach suspicious for small bowel obstruction. 2. Bibasilar atelectasis. Electronically Signed   By: Darliss Cheney M.D.   On: 02/06/2021 18:39   NM HEPATOBILIARY LEAK (POST-SURGICAL)  Result Date: 02/07/2021 CLINICAL DATA:  Upper abdominal pain status post cholecystectomy. EXAM: NUCLEAR MEDICINE HEPATOBILIARY IMAGING TECHNIQUE: Sequential images of the abdomen were obtained out to 60 minutes following intravenous administration of radiopharmaceutical. RADIOPHARMACEUTICALS:  5.5 mCi Tc-36m  Choletec IV COMPARISON:  February 02, 2021. FINDINGS: Prompt uptake and biliary excretion of activity by the liver is seen. No abnormal accumulation of contrast is noted to suggest bile leak. Biliary activity passes into small bowel, consistent with patent common bile duct. IMPRESSION: No definite scintigraphic evidence of bile leak. Electronically Signed   By: Lupita Raider M.D.   On:  02/07/2021 13:16    Scheduled Meds:  bisacodyl  10 mg Rectal BID   pantoprazole (PROTONIX) IV  40 mg Intravenous Q24H   polyethylene glycol  17 g Oral BID   Continuous Infusions:  lactated ringers with kcl 125 mL/hr at 02/07/21 1333     LOS: 0 days    Time spent: 30 minutes.    Vassie Loll, MD [facility]   To contact the attending provider between 7A-7P or the covering provider during after hours 7P-7A, please log into the web site [email or web address] and access using universal [place] password for that web site. If you do not have the password, please call the hospital operator.  02/07/2021, 6:11 PM

## 2021-02-07 NOTE — Progress Notes (Signed)
  Subjective: Patient's readmission noted.  States she was not taking her pain medication at home.  She felt a burning sensation in her stomach and that caused her to be nauseated.  She does have moderate incisional pain.  She denies any fever or chills.  Objective: Vital signs in last 24 hours: Temp:  [98 F (36.7 C)-98.6 F (37 C)] 98 F (36.7 C) (11/30 0553) Pulse Rate:  [91-96] 92 (11/30 0553) Resp:  [16-20] 19 (11/30 0553) BP: (131-143)/(72-83) 143/72 (11/30 0553) SpO2:  [92 %-98 %] 98 % (11/30 0553) Weight:  [62.1 kg] 62.1 kg (11/30 0201)    Intake/Output from previous day: 11/29 0701 - 11/30 0700 In: 1187 [I.V.:187; IV Piggyback:1000] Out: -  Intake/Output this shift: No intake/output data recorded.  General appearance: alert, cooperative, and fatigued Eyes: No scleral icterus Resp: clear to auscultation bilaterally Cardio: regular rate and rhythm, S1, S2 normal, no murmur, click, rub or gallop GI: Soft, incisions healing well.  Minimal bowel sounds appreciated.  No rigidity is noted.  Lab Results:  Recent Labs    02/06/21 1759 02/07/21 0514  WBC 9.8 11.1*  HGB 15.1* 13.6  HCT 44.0 41.0  PLT 258 262   BMET Recent Labs    02/06/21 1759 02/07/21 0514  NA 136 135  K 3.3* 3.4*  CL 96* 102  CO2 24 22  GLUCOSE 112* 107*  BUN 32* 29*  CREATININE 0.75 0.58  CALCIUM 8.9 8.0*   PT/INR No results for input(s): LABPROT, INR in the last 72 hours.  Studies/Results: DG Abdomen Acute W/Chest  Result Date: 02/06/2021 CLINICAL DATA:  Abdominal pain and vomiting. EXAM: DG ABDOMEN ACUTE WITH 1 VIEW CHEST COMPARISON:  None. FINDINGS: There is some dilated small bowel loops in the left abdomen measuring up to 4 cm containing air-fluid levels. Air seen throughout nondilated colon to the level the rectum. Cholecystectomy clips are present. There is also air-fluid level in the stomach. There are no suspicious calcifications. There is no free air under the diaphragm. There  are minimal atelectatic changes in the lung bases. The lungs are otherwise clear. There is no pleural effusion or pneumothorax. Cardiomediastinal silhouette is within normal limits. No acute fractures are seen. IMPRESSION: 1. Dilated small bowel with air-fluid levels with air-fluid level in the stomach suspicious for small bowel obstruction. 2. Bibasilar atelectasis. Electronically Signed   By: Darliss Cheney M.D.   On: 02/06/2021 18:39    Anti-infectives: Anti-infectives (From admission, onward)    None       Assessment/Plan: Impression: Status post laparoscopic cholecystectomy.  Presents with nausea and upper abdominal pain.  She does have an elevated lipase which may be secondary to her emesis at home.  Her liver enzyme tests are within normal limits.  No significant leukocytosis is noted.  She was not taking her pain medication at home. Plan: We will get HIDA scan to rule out bile leak.  I suspect her KUB findings are secondary to a mild ileus from the surgery.  Will defer CT scan of the abdomen at this time (CT scan not available at the present time).  Further management is pending those results.  We will give cathartic and she has been started on a PPI.  This was discussed with her and the family through an interpreter.  LOS: 0 days    Franky Macho 02/07/2021

## 2021-02-07 NOTE — H&P (Signed)
History and Physical    [name] SNK:[id number] DOB: Dec 13, 1960 DOA: 02/06/2021  PCP: Jacquelin Hawking, PA-C  Patient coming from: Home   Chief Complaint:  Chief Complaint  Patient presents with   Abdominal Pain     HPI:    60 year old spanish speaking female with past medical history of asthma gastroesophageal reflux disease and recent diagnosis of cholecystitis status post cholecystectomy on 11/26 who [facility] with nausea vomiting abdominal pain.  Patient explains that since her discharge from the hospital 11/27 she has continued to have generalized abdominal pain.  Patient describes his abdominal pain is sharp in quality, generalized, moderate to severe in intensity, worse with movement and improved with rest.  Patient additionally complains of nausea, vomiting and inability to tolerate oral intake.  Patient has only been able to tolerate occasional liquids.  Patient reports that she has not passed any flatus or moved her bowels since the surgery.  Since 11/27 due to patient's ongoing symptoms patient is developed grossly worsening generalized weakness and malaise prompting her to present to [facility] emergency department for evaluation.  Upon evaluation in emergency department patient's abdomen was noted to be tender but soft.  X-ray imaging of the abdomen was performed revealing possible small bowel obstruction.  ER provider then discussed case with Dr. Lovell Sheehan general surgery who reviewed the x-ray and disagreed with the small bowel obstruction assessment.  He said that if the patient was admitted to the hospitalist service he would evaluate patient in consultation the following morning.  Patient's symptoms continue to persist despite administration of both fentanyl Zofran and Reglan.  Due to patient's intractable symptoms and inability to tolerate oral intake the hospitalist group has been contacted to assess patient for  mission of hospital.  Review of Systems:   Review of Systems  Constitutional:  Positive for malaise/fatigue.  Gastrointestinal:  Positive for abdominal pain, nausea and vomiting.  All other systems reviewed and are negative.  Past Medical History:  Diagnosis Date   Acute cholecystitis 02/02/2021   GERD without esophagitis 11/27/2020   Obesity, unspecified 02/15/2015    Past Surgical History:  Procedure Laterality Date   CHOLECYSTECTOMY N/A 02/03/2021   Procedure: LAPAROSCOPIC CHOLECYSTECTOMY;  Surgeon: Franky Macho, MD;  Location: [facility];  Service: General;  Laterality: N/A;     reports that she has never smoked. She has never used smokeless tobacco. She reports that she does not drink alcohol and does not use drugs.  No Known Allergies  Family History  Problem Relation Age of Onset   Diabetes Sister    Cancer Brother      Prior to Admission medications   Medication Sig Start Date End Date Taking? Authorizing Provider  HYDROcodone-acetaminophen (NORCO) 5-325 MG tablet Take 1 tablet by mouth every 4 (four) hours as needed for moderate pain. 02/04/21  Yes Franky Macho, MD  pantoprazole (PROTONIX) 40 MG tablet Take 1 tablet (40 mg total) by mouth daily. Tome una tableta por boca diaria Patient not taking: Reported on 02/06/2021 01/22/21   Jacquelin Hawking, PA-C    Physical Exam: Vitals:   02/06/21 1734 02/06/21 2011 02/06/21 2100 02/07/21 0201  BP: (!) 143/83 131/73 137/81 138/79  Pulse: 93 94 91 96  Resp: 20 17 16 19   Temp: 98.2 F (36.8 C) 98.6 F (37 C)  98 F (36.7 C)  TempSrc:  Oral    SpO2: 92% 96% 97% 96%  Weight:    62.1 kg  Height:    4'  10" (1.473 m)    Constitutional: Awake alert and oriented x3, no associated distress.   Skin: no rashes, no lesions, poor skin turgor noted. Eyes: Pupils are equally reactive to light.  No evidence of scleral icterus or conjunctival pallor.  ENMT: dry mucous membranes noted.  Posterior pharynx clear of any exudate or  lesions.   Neck: normal, supple, no masses, no thyromegaly.  No evidence of jugular venous distension.   Respiratory: clear to auscultation bilaterally, no wheezing, no crackles. Normal respiratory effort. No accessory muscle use.  Cardiovascular: Regular rate and rhythm, no murmurs / rubs / gallops. No extremity edema. 2+ pedal pulses. No carotid bruits.  Chest:   Nontender without crepitus or deformity.   Back:   Nontender without crepitus or deformity. Abdomen: Abdomen is slightly protuberant with generalized tenderness but soft.  .  No evidence of intra-abdominal masses.  Hypoactive sounds noted in all quadrants.   Musculoskeletal: No joint deformity upper and lower extremities. Good ROM, no contractures. Normal muscle tone.  Neurologic: CN 2-12 grossly intact. Sensation intact.  Patient moving all 4 extremities spontaneously.  Patient is following all commands.  Patient is responsive to verbal stimuli.   Psychiatric: Patient exhibits normal mood with appropriate affect.  Patient seems to possess insight as to their current situation.     Labs on Admission: I have personally reviewed following labs and imaging studies -   CBC: Recent Labs  Lab 02/02/21 0935 02/03/21 0515 02/04/21 0420 02/06/21 1759 02/07/21 0514  WBC 12.7* 14.5* 17.5* 9.8 11.1*  NEUTROABS 10.2*  --   --  7.7 8.8*  HGB 15.0 15.0 12.6 15.1* 13.6  HCT 44.6 45.5 38.0 44.0 41.0  MCV 88.1 89.6 89.0 85.8 87.6  PLT 258 233 188 258 262   Basic Metabolic Panel: Recent Labs  Lab 02/02/21 0935 02/03/21 0515 02/04/21 0420 02/06/21 1759  NA 138 139 133* 136  K 3.6 3.7 3.4* 3.3*  CL 101 106 104 96*  CO2 23 25 21* 24  GLUCOSE 121* 104* 124* 112*  BUN 26* 16 16 32*  CREATININE 0.77 0.57 0.59 0.75  CALCIUM 9.0 8.5* 8.2* 8.9  MG  --  2.2  --   --    GFR: Estimated Creatinine Clearance: 56.9 mL/min (by C-G formula based on SCr of 0.75 mg/dL). Liver Function Tests: Recent Labs  Lab 02/02/21 0935 02/03/21 0515  02/04/21 0420 02/06/21 1759  AST 19 54* 44* 42*  ALT 14 35 46* 38  ALKPHOS 111 108 98 111  BILITOT 0.7 1.8* 1.2 1.3*  PROT 8.6* 7.8 6.8 7.9  ALBUMIN 4.1 3.6 3.0* 3.4*   Recent Labs  Lab 02/02/21 0935 02/06/21 1759  LIPASE 33 131*   No results for input(s): AMMONIA in the last 168 hours. Coagulation Profile: No results for input(s): INR, PROTIME in the last 168 hours. Cardiac Enzymes: No results for input(s): CKTOTAL, CKMB, CKMBINDEX, TROPONINI in the last 168 hours. BNP (last 3 results) No results for input(s): PROBNP in the last 8760 hours. HbA1C: No results for input(s): HGBA1C in the last 72 hours. CBG: No results for input(s): GLUCAP in the last 168 hours. Lipid Profile: No results for input(s): CHOL, HDL, LDLCALC, TRIG, CHOLHDL, LDLDIRECT in the last 72 hours. Thyroid Function Tests: No results for input(s): TSH, T4TOTAL, FREET4, T3FREE, THYROIDAB in the last 72 hours. Anemia Panel: No results for input(s): VITAMINB12, FOLATE, FERRITIN, TIBC, IRON, RETICCTPCT in the last 72 hours. Urine analysis:    Component Value Date/Time  COLORURINE YELLOW 02/02/2021 0946   APPEARANCEUR HAZY (A) 02/02/2021 0946   LABSPEC 1.025 02/02/2021 0946   PHURINE 6.0 02/02/2021 0946   GLUCOSEU NEGATIVE 02/02/2021 0946   HGBUR TRACE (A) 02/02/2021 0946   BILIRUBINUR NEGATIVE 02/02/2021 0946   BILIRUBINUR NEG 08/27/2016 1319   KETONESUR NEGATIVE 02/02/2021 0946   PROTEINUR TRACE (A) 02/02/2021 0946   UROBILINOGEN 0.2 08/27/2016 1319   NITRITE NEGATIVE 02/02/2021 0946   LEUKOCYTESUR MODERATE (A) 02/02/2021 0946    Radiological Exams on Admission - Personally Reviewed: DG Abdomen Acute W/Chest  Result Date: 02/06/2021 CLINICAL DATA:  Abdominal pain and vomiting. EXAM: DG ABDOMEN ACUTE WITH 1 VIEW CHEST COMPARISON:  None. FINDINGS: There is some dilated small bowel loops in the left abdomen measuring up to 4 cm containing air-fluid levels. Air seen throughout nondilated colon to the  level the rectum. Cholecystectomy clips are present. There is also air-fluid level in the stomach. There are no suspicious calcifications. There is no free air under the diaphragm. There are minimal atelectatic changes in the lung bases. The lungs are otherwise clear. There is no pleural effusion or pneumothorax. Cardiomediastinal silhouette is within normal limits. No acute fractures are seen. IMPRESSION: 1. Dilated small bowel with air-fluid levels with air-fluid level in the stomach suspicious for small bowel obstruction. 2. Bibasilar atelectasis. Electronically Signed   By: Darliss Cheney M.D.   On: 02/06/2021 18:39      Assessment/Plan  * SBO (small bowel obstruction) (HCC) Patient presenting with several day history of inability to tolerate oral intake, generalized abdominal pain and inability to pass flatus or move her bowels On examination abdomen is soft slightly protuberant with extremely hypoactive bowel sounds X-ray concerning for bowel obstruction While general surgery is not convinced that this is a bowel obstruction the lack of flatus or bowel movements since surgery is suggestive of bowel obstruction on my assessment. Will obtain CT imaging the abdomen and pelvis to better delineate whether there is truly is an obstruction. N.p.o. in the meantime Hydrating patient with intravenous isotonic fluids Correcting associated electrolyte abnormalities Treating with as needed antiemetics and analgesics If patient clinically worsens will place NG tube and set to low intermittent suction Dr. Lovell Sheehan with general surgery to evaluate patient in consultation later this morning.  Generalized abdominal pain Please see assessment and plan above.  GERD without esophagitis Intravenous PPI while n.p.o.  Hypokalemia Likely secondary to poor oral intake and gastrointestinal losses Replacing with intravenous potassium chloride Magnesium additionally being checked Monitoring with serial  chemistries.      Code Status:  Full code  code status decision has been confirmed with: patient Family Communication: Family at the bedside has been updated on plan of care.    Status is: Observation  The patient remains OBS appropriate and will d/c before 2 midnights.       Marinda Elk MD [facility] Pager [phone]  If 7PM-7AM, please contact night-coverage [email or web address] Use universal [place] password for that web site. If you do not have the password, please call the hospital operator.  02/07/2021, 5:53 AM

## 2021-02-07 NOTE — Assessment & Plan Note (Signed)
·   Please see assessment and plan above °

## 2021-02-07 NOTE — Progress Notes (Signed)
02/07/2021 0200:  Pt is spanish-speaking only. Pt oriented to unit via interpreter Gardiner Ramus [phone]. Interpreter also assisted with admission questions. Pt med administration explained by interpreter Charna Elizabeth [phone]. Pt son, Domingo Cocking, is at bedside.

## 2021-02-07 NOTE — Assessment & Plan Note (Signed)
   Likely secondary to poor oral intake and gastrointestinal losses  Replacing with intravenous potassium chloride  Magnesium additionally being checked  Monitoring with serial chemistries.

## 2021-02-07 NOTE — Assessment & Plan Note (Addendum)
   Patient presenting with several day history of inability to tolerate oral intake, generalized abdominal pain and inability to pass flatus or move her bowels  On examination abdomen is soft slightly protuberant with extremely hypoactive bowel sounds  X-ray concerning for bowel obstruction  While general surgery is not convinced that this is a bowel obstruction the lack of flatus or bowel movements since surgery is suggestive of bowel obstruction on my assessment.  Will obtain CT imaging the abdomen and pelvis to better delineate whether there is truly is an obstruction.  N.p.o. in the meantime  Hydrating patient with intravenous isotonic fluids  Correcting associated electrolyte abnormalities  Treating with as needed antiemetics and analgesics  If patient clinically worsens will place NG tube and set to low intermittent suction  Dr. Lovell Sheehan with general surgery to evaluate patient in consultation later this morning.

## 2021-02-07 NOTE — Assessment & Plan Note (Signed)
   Intravenous PPI while n.p.o. 

## 2021-02-08 LAB — COMPREHENSIVE METABOLIC PANEL
ALT: 33 U/L (ref 0–44)
AST: 28 U/L (ref 15–41)
Albumin: 3.1 g/dL — ABNORMAL LOW (ref 3.5–5.0)
Alkaline Phosphatase: 108 U/L (ref 38–126)
Anion gap: 8 (ref 5–15)
BUN: 14 mg/dL (ref 8–23)
CO2: 22 mmol/L (ref 22–32)
Calcium: 8.5 mg/dL — ABNORMAL LOW (ref 8.9–10.3)
Chloride: 105 mmol/L (ref 98–111)
Creatinine, Ser: 0.51 mg/dL (ref 0.44–1.00)
GFR, Estimated: >60 mL/min (ref >60)
Glucose, Bld: 103 mg/dL — ABNORMAL HIGH (ref 70–99)
Potassium: 4.1 mmol/L (ref 3.5–5.1)
Sodium: 135 mmol/L (ref 135–145)
Total Bilirubin: 0.3 mg/dL (ref 0.3–1.2)
Total Protein: 7.1 g/dL (ref 6.5–8.1)

## 2021-02-08 LAB — CBC
HCT: 40.3 % (ref 36.0–46.0)
Hemoglobin: 13.3 g/dL (ref 12.0–15.0)
MCH: 29.2 pg (ref 26.0–34.0)
MCHC: 33 g/dL (ref 30.0–36.0)
MCV: 88.4 fL (ref 80.0–100.0)
Platelets: 256 10*3/uL (ref 150–400)
RBC: 4.56 MIL/uL (ref 3.87–5.11)
RDW: 12.9 % (ref 11.5–15.5)
WBC: 9.7 10*3/uL (ref 4.0–10.5)
nRBC: 0 % (ref 0.0–0.2)

## 2021-02-08 LAB — LIPASE, BLOOD: Lipase: 144 U/L — ABNORMAL HIGH (ref 11–51)

## 2021-02-08 MED ORDER — PANTOPRAZOLE SODIUM 40 MG PO TBEC
40.0000 mg | DELAYED_RELEASE_TABLET | Freq: Every day | ORAL | Status: DC
  Administered 2021-02-09: 40 mg via ORAL
  Filled 2021-02-08: qty 1

## 2021-02-08 NOTE — Progress Notes (Signed)
  Subjective: Patient does feel better today.  She is constipated and requires suppository.  Objective: Vital signs in last 24 hours: Temp:  [98.3 F (36.8 C)-98.4 F (36.9 C)] 98.3 F (36.8 C) (12/01 0457) Pulse Rate:  [70-90] 83 (12/01 0457) Resp:  [19-20] 19 (12/01 0457) BP: (133-168)/(71-85) 168/85 (12/01 0457) SpO2:  [95 %-100 %] 96 % (12/01 0457) Last BM Date: 02/07/21 (per report)  Intake/Output from previous day: 11/30 0701 - 12/01 0700 In: 2028.3 [P.O.:360; I.V.:1668.3] Out: -  Intake/Output this shift: No intake/output data recorded.  General appearance: alert, cooperative, and no distress Resp: clear to auscultation bilaterally Cardio: regular rate and rhythm, S1, S2 normal, no murmur, click, rub or gallop GI: Soft, nontender, nondistended.  Incisions healing well.  Lab Results:  Recent Labs    02/07/21 0514 02/08/21 0519  WBC 11.1* 9.7  HGB 13.6 13.3  HCT 41.0 40.3  PLT 262 256   BMET Recent Labs    02/07/21 0514 02/08/21 0519  NA 135 135  K 3.4* 4.1  CL 102 105  CO2 22 22  GLUCOSE 107* 103*  BUN 29* 14  CREATININE 0.58 0.51  CALCIUM 8.0* 8.5*   PT/INR No results for input(s): LABPROT, INR in the last 72 hours.  Studies/Results: DG Abdomen Acute W/Chest  Result Date: 02/06/2021 CLINICAL DATA:  Abdominal pain and vomiting. EXAM: DG ABDOMEN ACUTE WITH 1 VIEW CHEST COMPARISON:  None. FINDINGS: There is some dilated small bowel loops in the left abdomen measuring up to 4 cm containing air-fluid levels. Air seen throughout nondilated colon to the level the rectum. Cholecystectomy clips are present. There is also air-fluid level in the stomach. There are no suspicious calcifications. There is no free air under the diaphragm. There are minimal atelectatic changes in the lung bases. The lungs are otherwise clear. There is no pleural effusion or pneumothorax. Cardiomediastinal silhouette is within normal limits. No acute fractures are seen. IMPRESSION:  1. Dilated small bowel with air-fluid levels with air-fluid level in the stomach suspicious for small bowel obstruction. 2. Bibasilar atelectasis. Electronically Signed   By: Darliss Cheney M.D.   On: 02/06/2021 18:39   NM HEPATOBILIARY LEAK (POST-SURGICAL)  Result Date: 02/07/2021 CLINICAL DATA:  Upper abdominal pain status post cholecystectomy. EXAM: NUCLEAR MEDICINE HEPATOBILIARY IMAGING TECHNIQUE: Sequential images of the abdomen were obtained out to 60 minutes following intravenous administration of radiopharmaceutical. RADIOPHARMACEUTICALS:  5.5 mCi Tc-31m  Choletec IV COMPARISON:  February 02, 2021. FINDINGS: Prompt uptake and biliary excretion of activity by the liver is seen. No abnormal accumulation of contrast is noted to suggest bile leak. Biliary activity passes into small bowel, consistent with patent common bile duct. IMPRESSION: No definite scintigraphic evidence of bile leak. Electronically Signed   By: Lupita Raider M.D.   On: 02/07/2021 13:16    Anti-infectives: Anti-infectives (From admission, onward)    None       Assessment/Plan: Impression: Status post laparoscopic cholecystectomy.  HIDA scan negative for bile leak.  Patient does have an elevated lipase but her LFTs are within normal limits.  She has mild pancreatitis.  Clinically, it is resolving. Plan: We will continue to monitor lipase.  She is on a regular diet.  We will attempt to keep pain and nausea under control.  Anticipate discharge in next 24 to 48 hours.  LOS: 1 day    Franky Macho 02/08/2021

## 2021-02-09 LAB — COMPREHENSIVE METABOLIC PANEL
ALT: 34 U/L (ref 0–44)
AST: 33 U/L (ref 15–41)
Albumin: 3.2 g/dL — ABNORMAL LOW (ref 3.5–5.0)
Alkaline Phosphatase: 101 U/L (ref 38–126)
Anion gap: 11 (ref 5–15)
BUN: 14 mg/dL (ref 8–23)
CO2: 21 mmol/L — ABNORMAL LOW (ref 22–32)
Calcium: 8.5 mg/dL — ABNORMAL LOW (ref 8.9–10.3)
Chloride: 103 mmol/L (ref 98–111)
Creatinine, Ser: 0.54 mg/dL (ref 0.44–1.00)
GFR, Estimated: >60 mL/min (ref >60)
Glucose, Bld: 96 mg/dL (ref 70–99)
Potassium: 4.2 mmol/L (ref 3.5–5.1)
Sodium: 135 mmol/L (ref 135–145)
Total Bilirubin: 0.7 mg/dL (ref 0.3–1.2)
Total Protein: 7.4 g/dL (ref 6.5–8.1)

## 2021-02-09 LAB — LIPASE, BLOOD: Lipase: 157 U/L — ABNORMAL HIGH (ref 11–51)

## 2021-02-09 MED ORDER — PANTOPRAZOLE SODIUM 40 MG PO TBEC: 40.0000 mg | DELAYED_RELEASE_TABLET | Freq: Every day | ORAL | 3 refills | Status: DC

## 2021-02-09 MED ORDER — POLYETHYLENE GLYCOL 3350 17 G PO PACK: 17.0000 g | PACK | Freq: Every day | ORAL | 1 refills | Status: DC

## 2021-02-09 NOTE — Plan of Care (Signed)
  Problem: Elimination: Goal: Will not experience complications related to bowel motility Outcome: Not Progressing   Problem: Elimination: Goal: Will not experience complications related to urinary retention Outcome: Not Progressing   Problem: Pain Managment: Goal: General experience of comfort will improve Outcome: Not Progressing

## 2021-02-09 NOTE — Discharge Summary (Signed)
Physician Discharge Summary  Patient ID: Janice Wells MRN: [id number] DOB/AGE: 1960-05-04 60 y.o.  Admit date: 02/06/2021 Discharge date: 02/09/2021  Admission Diagnoses: Postoperative nausea and abdominal pain  Discharge Diagnoses: Same Principal Problem:   SBO (small bowel obstruction) (HCC) Active Problems:   GERD without esophagitis   Generalized abdominal pain   Hypokalemia   Intractable nausea and vomiting Pancreatitis  Discharged Condition: good  Hospital Course: Patient is a 60 year old Hispanic female status post laparoscopic cholecystectomy for cholecystitis secondary to cholelithiasis on 02/03/2021 who came back to the emergency room on 02/06/2021 with worsening epigastric pain, nausea, and vomiting.  She was admitted to the hospital for further work-up.  HIDA scan was negative for bile leak.  Her LFTs were within normal limits.  She did have an elevated lipase which is felt to be secondary to her nausea and vomiting.  She was started on a PPI.  Her lipase has remained elevated throughout her stay, but her epigastric pain has resolved.  She is moving her bowels.  She is tolerating a regular diet well.  She is being discharged home on 02/09/2021 in good and improving condition.   Discharge Exam: Blood pressure (!) 148/82, pulse 93, temperature 98.4 F (36.9 C), temperature source Oral, resp. rate 16, height 4\' 10"  (1.473 m), weight 62.1 kg, last menstrual period 03/11/2008, SpO2 93 %. General appearance: alert, cooperative, and no distress Resp: clear to auscultation bilaterally Cardio: regular rate and rhythm, S1, S2 normal, no murmur, click, rub or gallop GI: Soft, incisions healing well.  Disposition: Discharge disposition: 01-Home or Self Care       Discharge Instructions     Diet - low sodium heart healthy   Complete by: As directed    Increase activity slowly   Complete by: As directed       Allergies as of 02/09/2021   No Known Allergies       Medication List     TAKE these medications    HYDROcodone-acetaminophen 5-325 MG tablet Commonly known as: Norco Take 1 tablet by mouth every 4 (four) hours as needed for moderate pain.   pantoprazole 40 MG tablet Commonly known as: PROTONIX Take 1 tablet (40 mg total) by mouth daily. Tome una tableta por boca diaria   polyethylene glycol 17 g packet Commonly known as: MIRALAX / GLYCOLAX Take 17 g by mouth daily.        Follow-up Information     14/04/2020, MD Follow up.   Specialty: General Surgery Why: As needed Contact information: [address] [place] [phone]                 Signed: [phone] 02/09/2021, 10:25 AM

## 2021-02-15 ENCOUNTER — Emergency Department (HOSPITAL_COMMUNITY): Payer: Self-pay

## 2021-02-15 ENCOUNTER — Inpatient Hospital Stay (HOSPITAL_COMMUNITY)
Admission: EM | Admit: 2021-02-15 | Discharge: 2021-02-19 | DRG: 395 | Disposition: A | Discharge status: Home / Self Care | Payer: Self-pay | Attending: Family Medicine | Admitting: Family Medicine

## 2021-02-15 ENCOUNTER — Encounter (HOSPITAL_COMMUNITY): Payer: Self-pay | Admitting: *Deleted

## 2021-02-15 DIAGNOSIS — K4030 Unilateral inguinal hernia, with obstruction, without gangrene, not specified as recurrent: Principal | ICD-10-CM | POA: Diagnosis present

## 2021-02-15 DIAGNOSIS — K219 Gastro-esophageal reflux disease without esophagitis: Secondary | ICD-10-CM | POA: Diagnosis present

## 2021-02-15 DIAGNOSIS — Z809 Family history of malignant neoplasm, unspecified: Secondary | ICD-10-CM

## 2021-02-15 DIAGNOSIS — K5900 Constipation, unspecified: Secondary | ICD-10-CM | POA: Diagnosis present

## 2021-02-15 DIAGNOSIS — Z20822 Contact with and (suspected) exposure to covid-19: Secondary | ICD-10-CM | POA: Diagnosis present

## 2021-02-15 DIAGNOSIS — R112 Nausea with vomiting, unspecified: Secondary | ICD-10-CM | POA: Diagnosis present

## 2021-02-15 DIAGNOSIS — Z789 Other specified health status: Secondary | ICD-10-CM | POA: Diagnosis present

## 2021-02-15 DIAGNOSIS — R1312 Dysphagia, oropharyngeal phase: Secondary | ICD-10-CM

## 2021-02-15 DIAGNOSIS — R1084 Generalized abdominal pain: Secondary | ICD-10-CM | POA: Diagnosis present

## 2021-02-15 DIAGNOSIS — K56609 Unspecified intestinal obstruction, unspecified as to partial versus complete obstruction: Principal | ICD-10-CM | POA: Diagnosis present

## 2021-02-15 DIAGNOSIS — Z833 Family history of diabetes mellitus: Secondary | ICD-10-CM

## 2021-02-15 DIAGNOSIS — K4090 Unilateral inguinal hernia, without obstruction or gangrene, not specified as recurrent: Secondary | ICD-10-CM

## 2021-02-15 LAB — CBC
HCT: 45.7 % (ref 36.0–46.0)
Hemoglobin: 15.8 g/dL — ABNORMAL HIGH (ref 12.0–15.0)
MCH: 29.8 pg (ref 26.0–34.0)
MCHC: 34.6 g/dL (ref 30.0–36.0)
MCV: 86.2 fL (ref 80.0–100.0)
Platelets: 342 10*3/uL (ref 150–400)
RBC: 5.3 MIL/uL — ABNORMAL HIGH (ref 3.87–5.11)
RDW: 12.4 % (ref 11.5–15.5)
WBC: 12.8 10*3/uL — ABNORMAL HIGH (ref 4.0–10.5)
nRBC: 0 % (ref 0.0–0.2)

## 2021-02-15 LAB — COMPREHENSIVE METABOLIC PANEL
ALT: 41 U/L (ref 0–44)
AST: 36 U/L (ref 15–41)
Albumin: 4 g/dL (ref 3.5–5.0)
Alkaline Phosphatase: 99 U/L (ref 38–126)
Anion gap: 16 — ABNORMAL HIGH (ref 5–15)
BUN: 40 mg/dL — ABNORMAL HIGH (ref 8–23)
CO2: 22 mmol/L (ref 22–32)
Calcium: 9.1 mg/dL (ref 8.9–10.3)
Chloride: 94 mmol/L — ABNORMAL LOW (ref 98–111)
Creatinine, Ser: 0.82 mg/dL (ref 0.44–1.00)
GFR, Estimated: >60 mL/min (ref >60)
Glucose, Bld: 123 mg/dL — ABNORMAL HIGH (ref 70–99)
Potassium: 4.1 mmol/L (ref 3.5–5.1)
Sodium: 132 mmol/L — ABNORMAL LOW (ref 135–145)
Total Bilirubin: 1.4 mg/dL — ABNORMAL HIGH (ref 0.3–1.2)
Total Protein: 8.7 g/dL — ABNORMAL HIGH (ref 6.5–8.1)

## 2021-02-15 LAB — LIPASE, BLOOD: Lipase: 125 U/L — ABNORMAL HIGH (ref 11–51)

## 2021-02-15 MED ORDER — ONDANSETRON HCL 4 MG/2ML IJ SOLN
4.0000 mg | Freq: Once | INTRAMUSCULAR | Status: AC
  Administered 2021-02-15: 4 mg via INTRAVENOUS
  Filled 2021-02-15: qty 2

## 2021-02-15 MED ORDER — SODIUM CHLORIDE 0.9 % IV BOLUS
500.0000 mL | Freq: Once | INTRAVENOUS | Status: AC
  Administered 2021-02-15: 500 mL via INTRAVENOUS

## 2021-02-15 MED ORDER — MORPHINE SULFATE (PF) 4 MG/ML IV SOLN: 4.0000 mg | INTRAVENOUS | Status: DC | PRN

## 2021-02-15 MED ORDER — IOHEXOL 300 MG/ML  SOLN
100.0000 mL | Freq: Once | INTRAMUSCULAR | Status: AC | PRN
  Administered 2021-02-15: 100 mL via INTRAVENOUS

## 2021-02-15 MED ORDER — MORPHINE SULFATE (PF) 4 MG/ML IV SOLN
4.0000 mg | Freq: Once | INTRAVENOUS | Status: AC
  Administered 2021-02-15: 4 mg via INTRAVENOUS
  Filled 2021-02-15: qty 1

## 2021-02-15 NOTE — ED Triage Notes (Signed)
Had gallbladder surgery over a week ago, unable to eat due to vomiting

## 2021-02-15 NOTE — ED Provider Notes (Signed)
[facility] EMERGENCY DEPARTMENT Provider Note   CSN: [id number] Arrival date & time: 02/15/21  1704     History Chief Complaint  Patient presents with   Post-op Problem    Donyea Wilensky is a 60 y.o. female.  HPI  60 year old female with past medical history of recent laparoscopic cholecystectomy secondary to acute cholecystitis on 11/26 with Dr. Arnoldo Morale presents emergency department with ongoing right upper quadrant abdominal pain and nausea/vomiting every time she eats.  She also endorses chills and fever at home.  Patient's been taking over-the-counter medication without significant relief.  Denies any abdominal distention.  She has intermittent diarrhea, no genitourinary symptoms.  Patient is primarily Spanish-speaking and interpreter services used.  Past Medical History:  Diagnosis Date   Acute cholecystitis 02/02/2021   GERD without esophagitis 11/27/2020   Obesity, unspecified 02/15/2015    Patient Active Problem List   Diagnosis Date Noted   Generalized abdominal pain 02/07/2021   Hypokalemia 02/07/2021   Intractable nausea and vomiting 02/07/2021   SBO (small bowel obstruction) ([place]) 02/06/2021   GERD without esophagitis 11/27/2020   Obesity, unspecified 02/15/2015    Past Surgical History:  Procedure Laterality Date   CHOLECYSTECTOMY N/A 02/03/2021   Procedure: LAPAROSCOPIC CHOLECYSTECTOMY;  Surgeon: Aviva Signs, MD;  Location: [facility];  Service: General;  Laterality: N/A;     OB History     Gravida  0   Para  0   Term  0   Preterm  0   AB  0   Living         SAB  0   IAB  0   Ectopic  0   Multiple      Live Births              Family History  Problem Relation Age of Onset   Diabetes Sister    Cancer Brother     Social History   Tobacco Use   Smoking status: Never   Smokeless tobacco: Never  Substance Use Topics   Alcohol use: No   Drug use: No    Home Medications Prior to Admission medications    Medication Sig Start Date End Date Taking? Authorizing Provider  HYDROcodone-acetaminophen (NORCO) 5-325 MG tablet Take 1 tablet by mouth every 4 (four) hours as needed for moderate pain. 02/04/21   Aviva Signs, MD  pantoprazole (PROTONIX) 40 MG tablet Take 1 tablet (40 mg total) by mouth daily. Tome una tableta por boca diaria 02/09/21   Aviva Signs, MD  polyethylene glycol (MIRALAX / GLYCOLAX) 17 g packet Take 17 g by mouth daily. 02/09/21   Aviva Signs, MD    Allergies    Patient has no known allergies.  Review of Systems   Review of Systems  Constitutional:  Positive for appetite change, chills, fatigue and fever.  HENT:  Negative for congestion.   Eyes:  Negative for visual disturbance.  Respiratory:  Negative for shortness of breath.   Cardiovascular:  Negative for chest pain.  Gastrointestinal:  Positive for abdominal pain, diarrhea, nausea and vomiting.  Genitourinary:  Negative for dysuria.  Skin:  Negative for rash.  Neurological:  Negative for headaches.   Physical Exam Updated Vital Signs BP 129/80   Pulse (!) 112   Temp (!) 97.5 F (36.4 C) (Oral)   Resp 18   LMP 03/11/2008 (Approximate)   SpO2 96%   Physical Exam Vitals and nursing note reviewed.  Constitutional:      General: She is not in  acute distress.    Appearance: Normal appearance.  HENT:     Head: Normocephalic.     Mouth/Throat:     Mouth: Mucous membranes are moist.  Cardiovascular:     Rate and Rhythm: Tachycardia present.  Pulmonary:     Effort: Pulmonary effort is normal. No respiratory distress.  Abdominal:     General: There is no distension.     Palpations: Abdomen is soft.     Comments: Tenderness to palpation in the epigastric and upper quadrants, laparoscopic incision sites are healing, slight ecchymosis surrounding them, no purulent discharge, dehiscence or obvious findings of cellulitis  Skin:    General: Skin is warm.  Neurological:     Mental Status: She is alert and  oriented to person, place, and time. Mental status is at baseline.  Psychiatric:        Mood and Affect: Mood normal.    ED Results / Procedures / Treatments   Labs (all labs ordered are listed, but only abnormal results are displayed) Labs Reviewed  LIPASE, BLOOD - Abnormal; Notable for the following components:      Result Value   Lipase 125 (*)    All other components within normal limits  COMPREHENSIVE METABOLIC PANEL - Abnormal; Notable for the following components:   Sodium 132 (*)    Chloride 94 (*)    Glucose, Bld 123 (*)    BUN 40 (*)    Total Protein 8.7 (*)    Total Bilirubin 1.4 (*)    Anion gap 16 (*)    All other components within normal limits  CBC - Abnormal; Notable for the following components:   WBC 12.8 (*)    RBC 5.30 (*)    Hemoglobin 15.8 (*)    All other components within normal limits  URINALYSIS, ROUTINE W REFLEX MICROSCOPIC    EKG None  Radiology No results found.  Procedures Procedures   Medications Ordered in ED Medications  sodium chloride 0.9 % bolus 500 mL (has no administration in time range)  ondansetron (ZOFRAN) injection 4 mg (has no administration in time range)  morphine 4 MG/ML injection 4 mg (has no administration in time range)    ED Course  I have reviewed the triage vital signs and the nursing notes.  Pertinent labs & imaging results that were available during my care of the patient were reviewed by me and considered in my medical decision making (see chart for details).    MDM Rules/Calculators/A&P                           60 year old Spanish-speaking female presents emergency department abdominal pain and nausea/vomiting.  Had laparoscopic cholecystectomy a little over a month ago with Dr. Arnoldo Morale.  She is afebrile, tachycardic.  Abdomen is tender, nondistended, laparoscopic incisions are healing.  Blood work shows a mild leukocytosis and hyponatremia, lipase is slightly elevated at 125.  CT of the abdomen pelvis  shows a small area of fluid collection around the gallbladder, unclear significance.  But it also identifies a right inguinal hernia with transition point and findings of small bowel obstruction concerning for strangulated/incarcerated hernia.  On reevaluation patient has no palpable hernia in the right lower abdomen, no significant distention, no overlying skin changes.  Consulted general surgeon Dr. Constance Haw.  She recommends hospitalist admission, n.p.o.  COVID swab has been ordered.  Since getting Zofran and morphine patient has had no active vomiting.  If patient develops vomiting  they recommend NG tube.  They will plan for early morning evaluation and possible surgical intervention if needed.  Patients evaluation and results requires admission for further treatment and care. Patient agrees with admission plan, offers no new complaints and is stable/unchanged at time of admit.  Final Clinical Impression(s) / ED Diagnoses Final diagnoses:  None    Rx / DC Orders ED Discharge Orders     None        Rozelle Logan, DO 02/15/21 2247

## 2021-02-16 ENCOUNTER — Inpatient Hospital Stay (HOSPITAL_COMMUNITY): Payer: Self-pay

## 2021-02-16 ENCOUNTER — Other Ambulatory Visit: Payer: Self-pay

## 2021-02-16 ENCOUNTER — Encounter (HOSPITAL_COMMUNITY): Payer: Self-pay | Admitting: Family Medicine

## 2021-02-16 ENCOUNTER — Inpatient Hospital Stay (HOSPITAL_COMMUNITY): Admission: RE | Admit: 2021-02-16 | Payer: Self-pay | Source: Ambulatory Visit

## 2021-02-16 DIAGNOSIS — K56609 Unspecified intestinal obstruction, unspecified as to partial versus complete obstruction: Secondary | ICD-10-CM

## 2021-02-16 DIAGNOSIS — K4090 Unilateral inguinal hernia, without obstruction or gangrene, not specified as recurrent: Secondary | ICD-10-CM

## 2021-02-16 DIAGNOSIS — Z789 Other specified health status: Secondary | ICD-10-CM | POA: Diagnosis present

## 2021-02-16 DIAGNOSIS — K5900 Constipation, unspecified: Secondary | ICD-10-CM | POA: Diagnosis present

## 2021-02-16 LAB — COMPREHENSIVE METABOLIC PANEL
ALT: 41 U/L (ref 0–44)
AST: 39 U/L (ref 15–41)
Albumin: 3.4 g/dL — ABNORMAL LOW (ref 3.5–5.0)
Alkaline Phosphatase: 92 U/L (ref 38–126)
Anion gap: 9 (ref 5–15)
BUN: 33 mg/dL — ABNORMAL HIGH (ref 8–23)
CO2: 24 mmol/L (ref 22–32)
Calcium: 8.5 mg/dL — ABNORMAL LOW (ref 8.9–10.3)
Chloride: 100 mmol/L (ref 98–111)
Creatinine, Ser: 0.71 mg/dL (ref 0.44–1.00)
GFR, Estimated: >60 mL/min (ref >60)
Glucose, Bld: 99 mg/dL (ref 70–99)
Potassium: 3.7 mmol/L (ref 3.5–5.1)
Sodium: 133 mmol/L — ABNORMAL LOW (ref 135–145)
Total Bilirubin: 0.9 mg/dL (ref 0.3–1.2)
Total Protein: 7.3 g/dL (ref 6.5–8.1)

## 2021-02-16 LAB — CBC WITH DIFFERENTIAL/PLATELET
Abs Immature Granulocytes: 0.03 10*3/uL (ref 0.00–0.07)
Basophils Absolute: 0.1 10*3/uL (ref 0.0–0.1)
Basophils Relative: 1 %
Eosinophils Absolute: 0 10*3/uL (ref 0.0–0.5)
Eosinophils Relative: 0 %
HCT: 41 % (ref 36.0–46.0)
Hemoglobin: 13.9 g/dL (ref 12.0–15.0)
Immature Granulocytes: 0 %
Lymphocytes Relative: 25 %
Lymphs Abs: 2.3 10*3/uL (ref 0.7–4.0)
MCH: 29.8 pg (ref 26.0–34.0)
MCHC: 33.9 g/dL (ref 30.0–36.0)
MCV: 88 fL (ref 80.0–100.0)
Monocytes Absolute: 0.6 10*3/uL (ref 0.1–1.0)
Monocytes Relative: 7 %
Neutro Abs: 6 10*3/uL (ref 1.7–7.7)
Neutrophils Relative %: 67 %
Platelets: 291 10*3/uL (ref 150–400)
RBC: 4.66 MIL/uL (ref 3.87–5.11)
RDW: 12.6 % (ref 11.5–15.5)
WBC: 9.1 10*3/uL (ref 4.0–10.5)
nRBC: 0 % (ref 0.0–0.2)

## 2021-02-16 LAB — URINALYSIS, ROUTINE W REFLEX MICROSCOPIC
Bilirubin Urine: NEGATIVE
Glucose, UA: NEGATIVE mg/dL
Hgb urine dipstick: NEGATIVE
Ketones, ur: 15 mg/dL — AB
Leukocytes,Ua: NEGATIVE
Nitrite: NEGATIVE
Protein, ur: NEGATIVE mg/dL
Specific Gravity, Urine: 1.01 (ref 1.005–1.030)
pH: 5.5 (ref 5.0–8.0)

## 2021-02-16 LAB — MAGNESIUM: Magnesium: 2.3 mg/dL (ref 1.7–2.4)

## 2021-02-16 LAB — RESP PANEL BY RT-PCR (FLU A&B, COVID) ARPGX2
Influenza A by PCR: NEGATIVE
Influenza B by PCR: NEGATIVE
SARS Coronavirus 2 by RT PCR: NEGATIVE

## 2021-02-16 MED ORDER — ONDANSETRON HCL 4 MG PO TABS: 4.0000 mg | ORAL_TABLET | Freq: Four times a day (QID) | ORAL | Status: DC | PRN

## 2021-02-16 MED ORDER — ONDANSETRON HCL 4 MG/2ML IJ SOLN
4.0000 mg | Freq: Four times a day (QID) | INTRAMUSCULAR | Status: DC | PRN
  Administered 2021-02-16: 4 mg via INTRAVENOUS
  Filled 2021-02-16: qty 2

## 2021-02-16 MED ORDER — BOOST / RESOURCE BREEZE PO LIQD CUSTOM
1.0000 | Freq: Three times a day (TID) | ORAL | Status: DC
  Administered 2021-02-17 – 2021-02-18 (×4): 1 via ORAL

## 2021-02-16 MED ORDER — MORPHINE SULFATE (PF) 2 MG/ML IV SOLN: 2.0000 mg | INTRAVENOUS | Status: DC | PRN

## 2021-02-16 MED ORDER — SODIUM CHLORIDE 0.9 % IV SOLN: INTRAVENOUS | Status: DC

## 2021-02-16 MED ORDER — MORPHINE SULFATE (PF) 2 MG/ML IV SOLN
2.0000 mg | INTRAVENOUS | Status: DC | PRN
  Administered 2021-02-16: 2 mg via INTRAVENOUS
  Filled 2021-02-16: qty 1

## 2021-02-16 MED ORDER — ACETAMINOPHEN 650 MG RE SUPP: 650.0000 mg | Freq: Four times a day (QID) | RECTAL | Status: DC | PRN

## 2021-02-16 MED ORDER — ADULT MULTIVITAMIN W/MINERALS CH
1.0000 | ORAL_TABLET | Freq: Every day | ORAL | Status: DC
  Administered 2021-02-17 – 2021-02-19 (×3): 1 via ORAL
  Filled 2021-02-16 (×4): qty 1

## 2021-02-16 MED ORDER — PANTOPRAZOLE SODIUM 40 MG PO TBEC
40.0000 mg | DELAYED_RELEASE_TABLET | Freq: Every day | ORAL | Status: DC
  Administered 2021-02-16 – 2021-02-17 (×2): 40 mg via ORAL
  Filled 2021-02-16 (×2): qty 1

## 2021-02-16 MED ORDER — ACETAMINOPHEN 325 MG PO TABS: 650.0000 mg | ORAL_TABLET | Freq: Four times a day (QID) | ORAL | Status: DC | PRN

## 2021-02-16 MED ORDER — OXYCODONE HCL 5 MG PO TABS: 5.0000 mg | ORAL_TABLET | ORAL | Status: DC | PRN

## 2021-02-16 MED ORDER — BISACODYL 10 MG RE SUPP
10.0000 mg | Freq: Every day | RECTAL | Status: DC
  Administered 2021-02-16 – 2021-02-19 (×4): 10 mg via RECTAL
  Filled 2021-02-16 (×4): qty 1

## 2021-02-16 NOTE — Consult Note (Addendum)
[facility] Consult  Reason for Consult: SBO, right inguinal hernia  Referring Physician:  ED  Chief Complaint   Post-op Problem     HPI: Janice Wells is a 60 y.o. female with recent laparoscopic cholecystectomy and ileus /pancreatitis. She came into the ED with complaints of epigastric pain and nausea vomiting. .  She had her cholecystectomy on 11/26 and had been discharged on 11/27. She had acute appendicitis and a low elevation of her lipase/ mild pancreatitis. She says she had a BM yesterday before  coming to the hospital, and CT scan demonstrated a hernia with SB on the right inguinal area. She was not tender in this area and ED provider did not appreciate any hernia that was not  reduced. She was admitted overnight for monitoring and to determine if her SBO would improve after reduction.  She denies any vomiting since admission and her zofran. She still has some pain but it is in the upper abdomen.   H&P obtained with video interpretor.   Past Medical History:  Diagnosis Date   Acute cholecystitis 02/02/2021   GERD without esophagitis 11/27/2020   Obesity, unspecified 02/15/2015    Past Surgical History:  Procedure Laterality Date   CHOLECYSTECTOMY N/A 02/03/2021   Procedure: LAPAROSCOPIC CHOLECYSTECTOMY;  Surgeon: Franky Macho, MD;  Location: [facility];  Service: General;  Laterality: N/A;    Family History  Problem Relation Age of Onset   Diabetes Sister    Cancer Brother     Social History   Tobacco Use   Smoking status: Never   Smokeless tobacco: Never  Substance Use Topics   Alcohol use: No   Drug use: No    Medications: I have reviewed the patient's current medications. Prior to Admission:  Medications Prior to Admission  Medication Sig Dispense Refill Last Dose   HYDROcodone-acetaminophen (NORCO) 5-325 MG tablet Take 1 tablet by mouth every 4 (four) hours as needed for moderate pain. 25 tablet 0    pantoprazole (PROTONIX) 40  MG tablet Take 1 tablet (40 mg total) by mouth daily. Tome una tableta por boca diaria 30 tablet 3 Past Week   polyethylene glycol (MIRALAX / GLYCOLAX) 17 g packet Take 17 g by mouth daily. 14 each 1    Scheduled:  bisacodyl  10 mg Rectal Daily   pantoprazole  40 mg Oral Daily   Continuous:  sodium chloride 75 mL/hr at 02/16/21 1120   NWG:[id number] **OR** acetaminophen, morphine injection, ondansetron **OR** ondansetron (ZOFRAN) IV, oxyCODONE  No Known Allergies   ROS:  A comprehensive review of systems was negative except for: Gastrointestinal: positive for abdominal pain, nausea, and vomiting  Blood pressure 121/73, pulse 92, temperature (!) 97.5 F (36.4 C), temperature source Oral, resp. rate 18, height  (1.473 m), weight 58.7 kg, last menstrual period 03/11/2008, SpO2 99 %. Physical Exam Vitals reviewed.  Constitutional:      Appearance: Normal appearance.  HENT:     Head: Normocephalic.     Mouth/Throat:     Mouth: Mucous membranes are moist.  Eyes:     Extraocular Movements: Extraocular movements intact.  Cardiovascular:     Rate and Rhythm: Normal rate and regular rhythm.  Pulmonary:     Effort: Pulmonary effort is normal.     Breath sounds: Normal breath sounds.  Abdominal:     General: There is no distension.     Tenderness: There is no abdominal tenderness.     Hernia: A hernia is present. Hernia is present  in the right inguinal area.     Comments: Reduced right inguinal hernia, minimally tender, healing port sites with bruising and dried blood  Musculoskeletal:        General: Normal range of motion.     Cervical back: Normal range of motion.  Skin:    General: Skin is warm.  Neurological:     General: No focal deficit present.     Mental Status: She is alert and oriented to person, place, and time.  Psychiatric:        Mood and Affect: Mood normal.        Behavior: Behavior normal.        Thought Content: Thought content normal.     Results: Results for orders placed or performed during the hospital encounter of 02/15/21 (from the past 48 hour(s))  Urinalysis, Routine w reflex microscopic     Status: Abnormal   Collection Time: 02/15/21 12:55 AM  Result Value Ref Range   Color, Urine YELLOW YELLOW   APPearance CLEAR CLEAR   Specific Gravity, Urine 1.010 1.005 - 1.030   pH 5.5 5.0 - 8.0   Glucose, UA NEGATIVE NEGATIVE mg/dL   Hgb urine dipstick NEGATIVE NEGATIVE   Bilirubin Urine NEGATIVE NEGATIVE   Ketones, ur 15 (A) NEGATIVE mg/dL   Protein, ur NEGATIVE NEGATIVE mg/dL   Nitrite NEGATIVE NEGATIVE   Leukocytes,Ua NEGATIVE NEGATIVE    Comment: Microscopic not done on urines with negative protein, blood, leukocytes, nitrite, or glucose < 500 mg/dL. Performed at [facility], [address], [place], [place] [address]   Lipase, blood     Status: Abnormal   Collection Time: 02/15/21  5:44 PM  Result Value Ref Range   Lipase 125 (H) 11 - 51 U/L    Comment: Performed at [facility], [address], [place], [place] [address]  Comprehensive metabolic panel     Status: Abnormal   Collection Time: 02/15/21  5:44 PM  Result Value Ref Range   Sodium 132 (L) 135 - 145 mmol/L   Potassium 4.1 3.5 - 5.1 mmol/L   Chloride 94 (L) 98 - 111 mmol/L   CO2 22 22 - 32 mmol/L   Glucose, Bld 123 (H) 70 - 99 mg/dL    Comment: Glucose reference range applies only to samples taken after fasting for at least 8 hours.   BUN 40 (H) 8 - 23 mg/dL   Creatinine, Ser 1.30 0.44 - 1.00 mg/dL   Calcium 9.1 8.9 - 86.5 mg/dL   Total Protein 8.7 (H) 6.5 - 8.1 g/dL   Albumin 4.0 3.5 - 5.0 g/dL   AST 36 15 - 41 U/L   ALT 41 0 - 44 U/L   Alkaline Phosphatase 99 38 - 126 U/L   Total Bilirubin 1.4 (H) 0.3 - 1.2 mg/dL   GFR, Estimated >78 >46 mL/min    Comment: (NOTE) Calculated using the CKD-EPI Creatinine Equation (2021)    Anion gap 16 (H) 5 - 15    Comment: Performed at [facility], [address], [place], [place] [address]  CBC      Status: Abnormal   Collection Time: 02/15/21  5:44 PM  Result Value Ref Range   WBC 12.8 (H) 4.0 - 10.5 K/uL   RBC 5.30 (H) 3.87 - 5.11 MIL/uL   Hemoglobin 15.8 (H) 12.0 - 15.0 g/dL   HCT 28.4 13.2 - 44.0 %   MCV 86.2 80.0 - 100.0 fL   MCH 29.8 26.0 - 34.0 pg  MCHC 34.6 30.0 - 36.0 g/dL   RDW 71.0 62.6 - 94.8 %   Platelets 342 150 - 400 K/uL   nRBC 0.0 0.0 - 0.2 %    Comment: Performed at [facility], [address], [place], [place] [address]  Resp Panel by RT-PCR (Flu A&B, Covid) Nasopharyngeal Swab     Status: None   Collection Time: 02/15/21 10:02 PM   Specimen: Nasopharyngeal Swab; Nasopharyngeal(NP) swabs in vial transport medium  Result Value Ref Range   SARS Coronavirus 2 by RT PCR NEGATIVE NEGATIVE    Comment: (NOTE) SARS-CoV-2 target nucleic acids are NOT DETECTED.  The SARS-CoV-2 RNA is generally detectable in upper respiratory specimens during the acute phase of infection. The lowest concentration of SARS-CoV-2 viral copies this assay can detect is 138 copies/mL. A negative result does not preclude SARS-Cov-2 infection and should not be used as the sole basis for treatment or other patient management decisions. A negative result may occur with  improper specimen collection/handling, submission of specimen other than nasopharyngeal swab, presence of viral mutation(s) within the areas targeted by this assay, and inadequate number of viral copies(<138 copies/mL). A negative result must be combined with clinical observations, patient history, and epidemiological information. The expected result is Negative.  Fact Sheet for Patients:  BloggerCourse.com  Fact Sheet for Healthcare Providers:  SeriousBroker.it  This test is no t yet approved or cleared by the [place] FDA and  has been authorized for detection and/or diagnosis of SARS-CoV-2 by FDA under an Emergency Use Authorization (EUA). This EUA will remain  in  effect (meaning this test can be used) for the duration of the COVID-19 declaration under Section 564(b)(1) of the Act, 21 U.S.C.section 360bbb-3(b)(1), unless the authorization is terminated  or revoked sooner.       Influenza A by PCR NEGATIVE NEGATIVE   Influenza B by PCR NEGATIVE NEGATIVE    Comment: (NOTE) The Xpert Xpress SARS-CoV-2/FLU/RSV plus assay is intended as an aid in the diagnosis of influenza from Nasopharyngeal swab specimens and should not be used as a sole basis for treatment. Nasal washings and aspirates are unacceptable for Xpert Xpress SARS-CoV-2/FLU/RSV testing.  Fact Sheet for Patients: BloggerCourse.com  Fact Sheet for Healthcare Providers: SeriousBroker.it  This test is not yet approved or cleared by the [place] FDA and has been authorized for detection and/or diagnosis of SARS-CoV-2 by FDA under an Emergency Use Authorization (EUA). This EUA will remain in effect (meaning this test can be used) for the duration of the COVID-19 declaration under Section 564(b)(1) of the Act, 21 U.S.C. section 360bbb-3(b)(1), unless the authorization is terminated or revoked.  Performed at [facility], [address], [place], [place] [address]   Comprehensive metabolic panel     Status: Abnormal   Collection Time: 02/16/21  8:25 AM  Result Value Ref Range   Sodium 133 (L) 135 - 145 mmol/L   Potassium 3.7 3.5 - 5.1 mmol/L   Chloride 100 98 - 111 mmol/L   CO2 24 22 - 32 mmol/L   Glucose, Bld 99 70 - 99 mg/dL    Comment: Glucose reference range applies only to samples taken after fasting for at least 8 hours.   BUN 33 (H) 8 - 23 mg/dL   Creatinine, Ser 9.38 0.44 - 1.00 mg/dL   Calcium 8.5 (L) 8.9 - 10.3 mg/dL   Total Protein 7.3 6.5 - 8.1 g/dL   Albumin 3.4 (L) 3.5 - 5.0 g/dL   AST 39 15 - 41  U/L   ALT 41 0 - 44 U/L   Alkaline Phosphatase 92 38 - 126 U/L   Total Bilirubin 0.9 0.3 - 1.2 mg/dL   GFR,  Estimated >81 >82 mL/min    Comment: (NOTE) Calculated using the CKD-EPI Creatinine Equation (2021)    Anion gap 9 5 - 15    Comment: Performed at [facility], [address] [place], [place] [address]  Magnesium     Status: None   Collection Time: 02/16/21  8:25 AM  Result Value Ref Range   Magnesium 2.3 1.7 - 2.4 mg/dL    Comment: Performed at [facility], [address], [place], [place] [address]  CBC WITH DIFFERENTIAL     Status: None   Collection Time: 02/16/21  8:25 AM  Result Value Ref Range   WBC 9.1 4.0 - 10.5 K/uL   RBC 4.66 3.87 - 5.11 MIL/uL   Hemoglobin 13.9 12.0 - 15.0 g/dL   HCT 93.8 10.1 - 75.1 %   MCV 88.0 80.0 - 100.0 fL   MCH 29.8 26.0 - 34.0 pg   MCHC 33.9 30.0 - 36.0 g/dL   RDW 02.5 85.2 - 77.8 %   Platelets 291 150 - 400 K/uL   nRBC 0.0 0.0 - 0.2 %   Neutrophils Relative % 67 %   Neutro Abs 6.0 1.7 - 7.7 K/uL   Lymphocytes Relative 25 %   Lymphs Abs 2.3 0.7 - 4.0 K/uL   Monocytes Relative 7 %   Monocytes Absolute 0.6 0.1 - 1.0 K/uL   Eosinophils Relative 0 %   Eosinophils Absolute 0.0 0.0 - 0.5 K/uL   Basophils Relative 1 %   Basophils Absolute 0.1 0.0 - 0.1 K/uL   Immature Granulocytes 0 %   Abs Immature Granulocytes 0.03 0.00 - 0.07 K/uL    Comment: Performed at [facility], [address] [place], [place] [address]   Personally reviewed- loop of small bowel in the hernia but no fluid or thickening of the bowel  CT Abdomen Pelvis W Contrast  Result Date: 02/15/2021 CLINICAL DATA:  Acute abdominal pain. Fever. Post cholecystectomy 1 week ago. EXAM: CT ABDOMEN AND PELVIS WITH CONTRAST TECHNIQUE: Multidetector CT imaging of the abdomen and pelvis was performed using the standard protocol following bolus administration of intravenous contrast. CONTRAST:  OMNIPAQUE IOHEXOL 300 MG/ML  SOLN COMPARISON:  Ultrasound abdomen 02/02/2021. FINDINGS: Lower chest: No acute abnormality. Hepatobiliary: Status post cholecystectomy. There is a fluid collection  in the gallbladder fossa measuring 3.5 x 1.8 x 2.1. There is a tiny focus of air near the gallbladder fossa, likely postoperative. There is no biliary ductal dilatation. There is a hypodense lesion in the left lobe of the liver measuring 1 cm which is too small to characterize. The liver otherwise appears within normal limits. Pancreas: Unremarkable. No pancreatic ductal dilatation or surrounding inflammatory changes. Spleen: Small in size. Adrenals/Urinary Tract: Adrenal glands are unremarkable. Kidneys are normal, without renal calculi, focal lesion, or hydronephrosis. Bladder is unremarkable. Stomach/Bowel: There is a small hernia in the right groin containing a loop of small bowel. This is transition point for small bowel obstruction. Small bowel loops proximal to this level are dilated and fluid-filled measuring up to 3.5 cm. Stomach is nondilated. Small bowel distal to this level is decompressed. The appendix is within normal limits. There is no free air, pneumatosis or mesenteric edema. Vascular/Lymphatic: Aortic atherosclerosis. No enlarged abdominal or pelvic lymph nodes. Reproductive: Uterus and bilateral adnexa are unremarkable. Other: There is  no ascites. There is some stranding in the subcutaneous tissues of the anterior abdominal wall likely related to recent surgery. Musculoskeletal: No acute or significant osseous findings. IMPRESSION: 1. Small hernia in the right groin, possibly inguinal, containing small bowel loop. This is transition point for small bowel obstruction. Findings are concerning for strangulated/incarcerated hernia. 2. Status post cholecystectomy. There is a fluid collection in the gallbladder fossa, indeterminate. Electronically Signed   By: Darliss Cheney M.D.   On: 02/15/2021 21:05     Assessment & Plan:  Janice Wells is a 60 y.o. female with a R inguinal hernia and SBO on her CT scan. Hernia is reduced. Xray repeat with continued dilated bowel but no signs of  nausea or vomiting now. I think she is reduced but the bowel still has not decompressed. Will do a trial of liquids and give suppository.   Will hopefully tolerate and adv from there  Cholecystectomy site looks wnl and low grade lipase elevation improving  No concern for infection at this time.   I do not think back to back surgery is appropriate for this patient. Will need to have her follow up as outpatient for R inguinal hernia repair in the future if does ok with plans for po.   All questions were answered to the satisfaction of the patient and family.  Updated team.    Janice Wells 02/16/2021, 11:24 AM

## 2021-02-16 NOTE — Plan of Care (Signed)

## 2021-02-16 NOTE — Progress Notes (Signed)
Nauseated and in pain in RLQ.  Has had 3 loose stools today in toilet and says pain increases when having bms.  Has not been able to have any of supper tray.  Contacted Dr,. Bridges and Dr. Laural Benes.  They lowered dose of morphine from 4 mg to 2 mg and dose of 2 mg just given along with zofran.  Spoke through nterpreter for third time today. Son will return to spend night and other family visiting now.

## 2021-02-16 NOTE — Progress Notes (Signed)
ASSUMPTION OF CARE NOTE   02/16/2021 12:53 PM  Janice Wells was seen and examined.  The H&P by the admitting provider, orders, imaging was reviewed.  Please see new orders.  Will continue to follow.  Discussed plan of care with Dr. Henreitta Leber.  Clear liquid diet has been started.  Labs stable to improved.  Qualified Spanish interpreter used to communicate with patient and son today.     Vitals:   02/16/21 0754 02/16/21 1244  BP:  131/78  Pulse:  (!) 102  Resp:  18  Temp:  (!) 97.4 F (36.3 C)  SpO2: 99% 98%    Results for orders placed or performed during the hospital encounter of 02/15/21  Resp Panel by RT-PCR (Flu A&B, Covid) Nasopharyngeal Swab   Specimen: Nasopharyngeal Swab; Nasopharyngeal(NP) swabs in vial transport medium  Result Value Ref Range   SARS Coronavirus 2 by RT PCR NEGATIVE NEGATIVE   Influenza A by PCR NEGATIVE NEGATIVE   Influenza B by PCR NEGATIVE NEGATIVE  Lipase, blood  Result Value Ref Range   Lipase 125 (H) 11 - 51 U/L  Comprehensive metabolic panel  Result Value Ref Range   Sodium 132 (L) 135 - 145 mmol/L   Potassium 4.1 3.5 - 5.1 mmol/L   Chloride 94 (L) 98 - 111 mmol/L   CO2 22 22 - 32 mmol/L   Glucose, Bld 123 (H) 70 - 99 mg/dL   BUN 40 (H) 8 - 23 mg/dL   Creatinine, Ser 9.37 0.44 - 1.00 mg/dL   Calcium 9.1 8.9 - 90.2 mg/dL   Total Protein 8.7 (H) 6.5 - 8.1 g/dL   Albumin 4.0 3.5 - 5.0 g/dL   AST 36 15 - 41 U/L   ALT 41 0 - 44 U/L   Alkaline Phosphatase 99 38 - 126 U/L   Total Bilirubin 1.4 (H) 0.3 - 1.2 mg/dL   GFR, Estimated >40 >97 mL/min   Anion gap 16 (H) 5 - 15  CBC  Result Value Ref Range   WBC 12.8 (H) 4.0 - 10.5 K/uL   RBC 5.30 (H) 3.87 - 5.11 MIL/uL   Hemoglobin 15.8 (H) 12.0 - 15.0 g/dL   HCT 35.3 29.9 - 24.2 %   MCV 86.2 80.0 - 100.0 fL   MCH 29.8 26.0 - 34.0 pg   MCHC 34.6 30.0 - 36.0 g/dL   RDW 68.3 41.9 - 62.2 %   Platelets 342 150 - 400 K/uL   nRBC 0.0 0.0 - 0.2 %  Urinalysis, Routine w reflex microscopic   Result Value Ref Range   Color, Urine YELLOW YELLOW   APPearance CLEAR CLEAR   Specific Gravity, Urine 1.010 1.005 - 1.030   pH 5.5 5.0 - 8.0   Glucose, UA NEGATIVE NEGATIVE mg/dL   Hgb urine dipstick NEGATIVE NEGATIVE   Bilirubin Urine NEGATIVE NEGATIVE   Ketones, ur 15 (A) NEGATIVE mg/dL   Protein, ur NEGATIVE NEGATIVE mg/dL   Nitrite NEGATIVE NEGATIVE   Leukocytes,Ua NEGATIVE NEGATIVE  Comprehensive metabolic panel  Result Value Ref Range   Sodium 133 (L) 135 - 145 mmol/L   Potassium 3.7 3.5 - 5.1 mmol/L   Chloride 100 98 - 111 mmol/L   CO2 24 22 - 32 mmol/L   Glucose, Bld 99 70 - 99 mg/dL   BUN 33 (H) 8 - 23 mg/dL   Creatinine, Ser 2.97 0.44 - 1.00 mg/dL   Calcium 8.5 (L) 8.9 - 10.3 mg/dL   Total Protein 7.3 6.5 - 8.1 g/dL  Albumin 3.4 (L) 3.5 - 5.0 g/dL   AST 39 15 - 41 U/L   ALT 41 0 - 44 U/L   Alkaline Phosphatase 92 38 - 126 U/L   Total Bilirubin 0.9 0.3 - 1.2 mg/dL   GFR, Estimated >61 >95 mL/min   Anion gap 9 5 - 15  Magnesium  Result Value Ref Range   Magnesium 2.3 1.7 - 2.4 mg/dL  CBC WITH DIFFERENTIAL  Result Value Ref Range   WBC 9.1 4.0 - 10.5 K/uL   RBC 4.66 3.87 - 5.11 MIL/uL   Hemoglobin 13.9 12.0 - 15.0 g/dL   HCT 09.3 26.7 - 12.4 %   MCV 88.0 80.0 - 100.0 fL   MCH 29.8 26.0 - 34.0 pg   MCHC 33.9 30.0 - 36.0 g/dL   RDW 58.0 99.8 - 33.8 %   Platelets 291 150 - 400 K/uL   nRBC 0.0 0.0 - 0.2 %   Neutrophils Relative % 67 %   Neutro Abs 6.0 1.7 - 7.7 K/uL   Lymphocytes Relative 25 %   Lymphs Abs 2.3 0.7 - 4.0 K/uL   Monocytes Relative 7 %   Monocytes Absolute 0.6 0.1 - 1.0 K/uL   Eosinophils Relative 0 %   Eosinophils Absolute 0.0 0.0 - 0.5 K/uL   Basophils Relative 1 %   Basophils Absolute 0.1 0.0 - 0.1 K/uL   Immature Granulocytes 0 %   Abs Immature Granulocytes 0.03 0.00 - 0.07 K/uL   C. Laural Benes, MD [facility]   02/15/2021  5:58 PM How to contact the [facility] Attending or Consulting provider 7A - 7P or covering provider during after  hours 7P -7A, for this patient?  Check the care team in [facility] and look for a) attending/consulting TRH provider listed and b) the [facility] team listed Log into [email or web address] and use [place]'s universal password to access. If you do not have the password, please contact the hospital operator. Locate the [facility] provider you are looking for under [facility] and page to a number that you can be directly reached. If you still have difficulty reaching the provider, please page the [facility] (Director on Call) for the Hospitalists listed on amion for assistance.

## 2021-02-16 NOTE — Progress Notes (Signed)
  Transition of Care [facility]) Screening Note   Patient Details  Name: Janice Wells Date of Birth: Feb 12, 1961   [facility]) CM/SW Contact:    Annice Needy, LCSW Phone Number: 02/16/2021, 11:44 AM    Transition of Care Department [facility]) has reviewed patient and no TOC needs have been identified at this time. We will continue to monitor patient advancement through interdisciplinary progression rounds. If new patient transition needs arise, please place a TOC consult.

## 2021-02-16 NOTE — Progress Notes (Signed)
Initial Nutrition Assessment  DOCUMENTATION CODES:      INTERVENTION:  Boost Breeze po TID, each supplement provides 250 kcal and 9 grams of protein   Multivitamin daily  NUTRITION DIAGNOSIS:   Inadequate oral intake related to acute illness as evidenced by meal completion < 25%, percent weight loss.   GOAL:  Patient will meet greater than or equal to 90% of their needs   MONITOR:  Diet advancement, Supplement acceptance, PO intake, Weight trends, Labs, Skin   REASON FOR ASSESSMENT:   Malnutrition Screening Tool    ASSESSMENT: Patient is a 60 yo female with GERD, acute cholecystitis (02/02/21). Lap cholecystectomy 11/26. She presents with epigastric pain, N/V. SBO, right inguinal hernia. Surgery following.  Patient on clear liquids. Poor intake - lunch tray observed. Patient consumed 4 oz grape juice and 3 oz jello and 2-3 oz broth. No known episodes vomiting today. Talked with nursing.    Acute significant weight loss per chart -12% (8 kg) the past 2 weeks. Suspect patient acutely malnourished. Will continue to follow po intake as diet advances.   Medications reviewed and include: protonix, dulcolax  IVF-NS@ 75 ml/hr  Labs:  BMP Latest Ref Rng & Units 02/16/2021 02/15/2021 02/09/2021  Glucose 70 - 99 mg/dL 99 952(W) 96  BUN 8 - 23 mg/dL 41(L) 24(M) 14  Creatinine 0.44 - 1.00 mg/dL 0.10 2.72 5.36  Sodium 135 - 145 mmol/L 133(L) 132(L) 135  Potassium 3.5 - 5.1 mmol/L 3.7 4.1 4.2  Chloride 98 - 111 mmol/L 100 94(L) 103  CO2 22 - 32 mmol/L 24 22 21(L)  Calcium 8.9 - 10.3 mg/dL 6.4(Q) 9.1 0.3(K)     NUTRITION - FOCUSED PHYSICAL EXAM: Partial exam - patient is fully dressed. Mild orbital and buccal fat depletion, mild temporal muscle loss.   Diet Order:   Diet Order             Diet clear liquid Room service appropriate? Yes; Fluid consistency: Thin  Diet effective now                   EDUCATION NEEDS:  No education needs have been identified at this  time  Skin:  Skin Assessment: Reviewed RN Assessment  Last BM:  unknown  Height:   Ht Readings from Last 1 Encounters:  02/16/21 4\' 10"  (1.473 m)    Weight:   Wt Readings from Last 1 Encounters:  02/16/21 58.7 kg    Ideal Body Weight:   45 kg  BMI:  Body mass index is 27.05 kg/m.  Estimated Nutritional Needs:   Kcal:  1800-1900  Protein:  77-83 gr  Fluid:  1.8 liters daily  14/09/22 MS,RD,CSG,LDN Contact: Royann Shivers

## 2021-02-16 NOTE — H&P (Signed)
TRH H&P    Patient Demographics:    Janice Wells, is a 60 y.o. female  MRN: [id number]  DOB - Apr 29, 1960  Admit Date - 02/15/2021  Referring MD/NP/PA: Wilkie Aye  Outpatient Primary MD for the patient is Jacquelin Hawking, PA-C  Patient coming from: Home  Chief complaint- abdominal pain   HPI:    [name]  is a 60 y.o. female, history of recent cholecystectomy, GERD, obesity, presents ED with a chief complaint of abdominal pain.  Patient reports that she had cholecystectomy on November 26.  She was discharged on that Saturday.  Since then she has not been able to have a bowel movement.  Patient reports 0 appetite because she has a sensation of fullness and bloating.  Patient reports she has not been eating, but when she tries to eat she vomits.  The emesis appears as green liquid.  Patient has generalized weakness.  She reports a decrease in urine output as well.  Patient reports that her pain is periumbilical and is burning.  She has not tried any pain medication at home but morphine helped in the ED.  Today when the pain was at its worst it was radiating to her chest and back, but the pain is resolved at this time.  Patient reports that she is not only not able to keep down solid foods, but not able to keep down water either.  Patient denies any fevers.  Patient has no other complaints.  Patient does not smoke, does not drink alcohol, does not use illicit drugs.  Patient is full code.  In the ED Temp 97.5, heart rate 102-126, respiratory 18-20, blood pressure 122/71, satting at 97% Patient has a leukocytosis 12.8, hemoglobin 15.8, platelets 342 Chemistry panel is unremarkable Lipase 125 T bili elevated at 1.4 CT abdomen pelvis shows small hernia in the right groin possibly inguinal, containing small bowel loop.  Transition point for small bowel obstruction.  Findings concerning for  incarcerated hernia.  Status post cholecystectomy with fluid collection at the gallbladder fossa as if indeterminate significance Morphine 4 mg, Zofran 4 mg, normal saline 500 mL given in the ED Dr. Henreitta Leber was consulted and reports that she will see patient in the a.m., not to do NG tube unless patient is actively vomiting, pain control and n.p.o.  Interview for this patient was conducted through the use of an interpreter full    Review of systems:    In addition to the HPI above,  No Fever-chills, No Headache, No changes with Vision or hearing, No problems swallowing food or Liquids, No Chest pain, Cough or Shortness of Breath, No Blood in stool or Urine, No dysuria, No new skin rashes or bruises, No new joints pains-aches,  No new weakness, tingling, numbness in any extremity, 7 pound weight loss No polyuria, polydypsia or polyphagia, No significant Mental Stressors.  All other systems reviewed and are negative.    Past History of the following :    Past Medical History:  Diagnosis Date   Acute cholecystitis 02/02/2021   GERD without esophagitis  11/27/2020   Obesity, unspecified 02/15/2015      Past Surgical History:  Procedure Laterality Date   CHOLECYSTECTOMY N/A 02/03/2021   Procedure: LAPAROSCOPIC CHOLECYSTECTOMY;  Surgeon: Franky Macho, MD;  Location: [facility];  Service: General;  Laterality: N/A;      Social History:      Social History   Tobacco Use   Smoking status: Never   Smokeless tobacco: Never  Substance Use Topics   Alcohol use: No       Family History :     Family History  Problem Relation Age of Onset   Diabetes Sister    Cancer Brother       Home Medications:   Prior to Admission medications   Medication Sig Start Date End Date Taking? Authorizing Provider  HYDROcodone-acetaminophen (NORCO) 5-325 MG tablet Take 1 tablet by mouth every 4 (four) hours as needed for moderate pain. 02/04/21   Franky Macho, MD  pantoprazole  (PROTONIX) 40 MG tablet Take 1 tablet (40 mg total) by mouth daily. Tome una tableta por boca diaria 02/09/21   Franky Macho, MD  polyethylene glycol (MIRALAX / GLYCOLAX) 17 g packet Take 17 g by mouth daily. 02/09/21   Franky Macho, MD     Allergies:    No Known Allergies   Physical Exam:   Vitals  Blood pressure 126/78, pulse (!) 101, temperature 97.7 F (36.5 C), resp. rate 16, height  (1.473 m), weight 58.7 kg, last menstrual period 03/11/2008, SpO2 99 %.   1.  General: Patient lying supine in bed,  no acute distress   2. Psychiatric: Alert and oriented x 3, mood and behavior normal for situation, pleasant and cooperative with exam   3. Neurologic: Speech and language are normal, face is symmetric, moves all 4 extremities voluntarily, at baseline without acute deficits on limited exam   4. HEENMT:  Head is atraumatic, normocephalic, pupils reactive to light, neck is supple, trachea is midline, mucous membranes are moist   5. Respiratory : Lungs are clear to auscultation bilaterally without wheezing, rhonchi, rales, no cyanosis, no increase in work of breathing or accessory muscle use   6. Cardiovascular : Heart rate normal, rhythm is regular, no murmurs, rubs or gallops, no peripheral edema, peripheral pulses palpated   7. Gastrointestinal:  Bruising over abdomen from previous lap chole, bruising in left lower quadrant possibly from heparin injections, soft, nondistended, nontender to palpation bowel sounds active, no masses or organomegaly palpated   8. Skin:  Skin is warm, dry and intact without rashes, acute lesions, or ulcers on limited exam   9.Musculoskeletal:  No acute deformities or trauma, no asymmetry in tone, no peripheral edema, peripheral pulses palpated, no tenderness to palpation in the extremities     Data Review:    CBC Recent Labs  Lab 02/15/21 1744  WBC 12.8*  HGB 15.8*  HCT 45.7  PLT 342  MCV 86.2  MCH 29.8  MCHC 34.6  RDW 12.4    ------------------------------------------------------------------------------------------------------------------  Results for orders placed or performed during the hospital encounter of 02/15/21 (from the past 48 hour(s))  Urinalysis, Routine w reflex microscopic     Status: Abnormal   Collection Time: 02/15/21 12:55 AM  Result Value Ref Range   Color, Urine YELLOW YELLOW   APPearance CLEAR CLEAR   Specific Gravity, Urine 1.010 1.005 - 1.030   pH 5.5 5.0 - 8.0   Glucose, UA NEGATIVE NEGATIVE mg/dL   Hgb urine dipstick NEGATIVE NEGATIVE   Bilirubin Urine  NEGATIVE NEGATIVE   Ketones, ur 15 (A) NEGATIVE mg/dL   Protein, ur NEGATIVE NEGATIVE mg/dL   Nitrite NEGATIVE NEGATIVE   Leukocytes,Ua NEGATIVE NEGATIVE    Comment: Microscopic not done on urines with negative protein, blood, leukocytes, nitrite, or glucose < 500 mg/dL. Performed at [facility], [address], [place], [place] [address]   Lipase, blood     Status: Abnormal   Collection Time: 02/15/21  5:44 PM  Result Value Ref Range   Lipase 125 (H) 11 - 51 U/L    Comment: Performed at [facility], [address], [place], [place] [address]  Comprehensive metabolic panel     Status: Abnormal   Collection Time: 02/15/21  5:44 PM  Result Value Ref Range   Sodium 132 (L) 135 - 145 mmol/L   Potassium 4.1 3.5 - 5.1 mmol/L   Chloride 94 (L) 98 - 111 mmol/L   CO2 22 22 - 32 mmol/L   Glucose, Bld 123 (H) 70 - 99 mg/dL    Comment: Glucose reference range applies only to samples taken after fasting for at least 8 hours.   BUN 40 (H) 8 - 23 mg/dL   Creatinine, Ser 2.77 0.44 - 1.00 mg/dL   Calcium 9.1 8.9 - 41.2 mg/dL   Total Protein 8.7 (H) 6.5 - 8.1 g/dL   Albumin 4.0 3.5 - 5.0 g/dL   AST 36 15 - 41 U/L   ALT 41 0 - 44 U/L   Alkaline Phosphatase 99 38 - 126 U/L   Total Bilirubin 1.4 (H) 0.3 - 1.2 mg/dL   GFR, Estimated >87 >86 mL/min    Comment: (NOTE) Calculated using the CKD-EPI Creatinine Equation (2021)    Anion gap 16  (H) 5 - 15    Comment: Performed at [facility], [address] [place], [place] [address]  CBC     Status: Abnormal   Collection Time: 02/15/21  5:44 PM  Result Value Ref Range   WBC 12.8 (H) 4.0 - 10.5 K/uL   RBC 5.30 (H) 3.87 - 5.11 MIL/uL   Hemoglobin 15.8 (H) 12.0 - 15.0 g/dL   HCT 94.7 09.6 - 28.3 %   MCV 86.2 80.0 - 100.0 fL   MCH 29.8 26.0 - 34.0 pg   MCHC 34.6 30.0 - 36.0 g/dL   RDW 66.2 94.7 - 65.4 %   Platelets 342 150 - 400 K/uL   nRBC 0.0 0.0 - 0.2 %    Comment: Performed at [facility], [address], [place], [place] [address]  Resp Panel by RT-PCR (Flu A&B, Covid) Nasopharyngeal Swab     Status: None   Collection Time: 02/15/21 10:02 PM   Specimen: Nasopharyngeal Swab; Nasopharyngeal(NP) swabs in vial transport medium  Result Value Ref Range   SARS Coronavirus 2 by RT PCR NEGATIVE NEGATIVE    Comment: (NOTE) SARS-CoV-2 target nucleic acids are NOT DETECTED.  The SARS-CoV-2 RNA is generally detectable in upper respiratory specimens during the acute phase of infection. The lowest concentration of SARS-CoV-2 viral copies this assay can detect is 138 copies/mL. A negative result does not preclude SARS-Cov-2 infection and should not be used as the sole basis for treatment or other patient management decisions. A negative result may occur with  improper specimen collection/handling, submission of specimen other than nasopharyngeal swab, presence of viral mutation(s) within the areas targeted by this assay, and inadequate number of viral copies(<138 copies/mL). A negative result must be combined with clinical observations, patient history, and epidemiological information. The  expected result is Negative.  Fact Sheet for Patients:  BloggerCourse.com  Fact Sheet for Healthcare Providers:  SeriousBroker.it  This test is no t yet approved or cleared by the [place] FDA and  has been authorized for detection  and/or diagnosis of SARS-CoV-2 by FDA under an Emergency Use Authorization (EUA). This EUA will remain  in effect (meaning this test can be used) for the duration of the COVID-19 declaration under Section 564(b)(1) of the Act, 21 U.S.C.section 360bbb-3(b)(1), unless the authorization is terminated  or revoked sooner.       Influenza A by PCR NEGATIVE NEGATIVE   Influenza B by PCR NEGATIVE NEGATIVE    Comment: (NOTE) The Xpert Xpress SARS-CoV-2/FLU/RSV plus assay is intended as an aid in the diagnosis of influenza from Nasopharyngeal swab specimens and should not be used as a sole basis for treatment. Nasal washings and aspirates are unacceptable for Xpert Xpress SARS-CoV-2/FLU/RSV testing.  Fact Sheet for Patients: BloggerCourse.com  Fact Sheet for Healthcare Providers: SeriousBroker.it  This test is not yet approved or cleared by the [place] FDA and has been authorized for detection and/or diagnosis of SARS-CoV-2 by FDA under an Emergency Use Authorization (EUA). This EUA will remain in effect (meaning this test can be used) for the duration of the COVID-19 declaration under Section 564(b)(1) of the Act, 21 U.S.C. section 360bbb-3(b)(1), unless the authorization is terminated or revoked.  Performed at [facility], [address] [place], [place] [address]     Chemistries  Recent Labs  Lab 02/09/21 [phone] 02/15/21 1744  NA 135 132*  K 4.2 4.1  CL 103 94*  CO2 21* 22  GLUCOSE 96 123*  BUN 14 40*  CREATININE 0.54 0.82  CALCIUM 8.5* 9.1  AST 33 36  ALT 34 41  ALKPHOS 101 99  BILITOT 0.7 1.4*   ------------------------------------------------------------------------------------------------------------------  ------------------------------------------------------------------------------------------------------------------ GFR: Estimated Creatinine Clearance: 53.9 mL/min (by C-G formula based on SCr of 0.82  mg/dL). Liver Function Tests: Recent Labs  Lab 02/09/21 0512 02/15/21 1744  AST 33 36  ALT 34 41  ALKPHOS 101 99  BILITOT 0.7 1.4*  PROT 7.4 8.7*  ALBUMIN 3.2* 4.0   Recent Labs  Lab 02/09/21 0512 02/15/21 1744  LIPASE 157* 125*   No results for input(s): AMMONIA in the last 168 hours. Coagulation Profile: No results for input(s): INR, PROTIME in the last 168 hours. Cardiac Enzymes: No results for input(s): CKTOTAL, CKMB, CKMBINDEX, TROPONINI in the last 168 hours. BNP (last 3 results) No results for input(s): PROBNP in the last 8760 hours. HbA1C: No results for input(s): HGBA1C in the last 72 hours. CBG: No results for input(s): GLUCAP in the last 168 hours. Lipid Profile: No results for input(s): CHOL, HDL, LDLCALC, TRIG, CHOLHDL, LDLDIRECT in the last 72 hours. Thyroid Function Tests: No results for input(s): TSH, T4TOTAL, FREET4, T3FREE, THYROIDAB in the last 72 hours. Anemia Panel: No results for input(s): VITAMINB12, FOLATE, FERRITIN, TIBC, IRON, RETICCTPCT in the last 72 hours.  --------------------------------------------------------------------------------------------------------------- Urine analysis:    Component Value Date/Time   COLORURINE YELLOW 02/15/2021 0055   APPEARANCEUR CLEAR 02/15/2021 0055   LABSPEC 1.010 02/15/2021 0055   PHURINE 5.5 02/15/2021 0055   GLUCOSEU NEGATIVE 02/15/2021 0055   HGBUR NEGATIVE 02/15/2021 0055   BILIRUBINUR NEGATIVE 02/15/2021 0055   BILIRUBINUR NEG 08/27/2016 1319   KETONESUR 15 (A) 02/15/2021 0055   PROTEINUR NEGATIVE 02/15/2021 0055   UROBILINOGEN 0.2 08/27/2016 1319   NITRITE NEGATIVE 02/15/2021 0055   LEUKOCYTESUR NEGATIVE 02/15/2021 0055  Imaging Results:    CT Abdomen Pelvis W Contrast  Result Date: 02/15/2021 CLINICAL DATA:  Acute abdominal pain. Fever. Post cholecystectomy 1 week ago. EXAM: CT ABDOMEN AND PELVIS WITH CONTRAST TECHNIQUE: Multidetector CT imaging of the abdomen and pelvis was  performed using the standard protocol following bolus administration of intravenous contrast. CONTRAST:  OMNIPAQUE IOHEXOL 300 MG/ML  SOLN COMPARISON:  Ultrasound abdomen 02/02/2021. FINDINGS: Lower chest: No acute abnormality. Hepatobiliary: Status post cholecystectomy. There is a fluid collection in the gallbladder fossa measuring 3.5 x 1.8 x 2.1. There is a tiny focus of air near the gallbladder fossa, likely postoperative. There is no biliary ductal dilatation. There is a hypodense lesion in the left lobe of the liver measuring 1 cm which is too small to characterize. The liver otherwise appears within normal limits. Pancreas: Unremarkable. No pancreatic ductal dilatation or surrounding inflammatory changes. Spleen: Small in size. Adrenals/Urinary Tract: Adrenal glands are unremarkable. Kidneys are normal, without renal calculi, focal lesion, or hydronephrosis. Bladder is unremarkable. Stomach/Bowel: There is a small hernia in the right groin containing a loop of small bowel. This is transition point for small bowel obstruction. Small bowel loops proximal to this level are dilated and fluid-filled measuring up to 3.5 cm. Stomach is nondilated. Small bowel distal to this level is decompressed. The appendix is within normal limits. There is no free air, pneumatosis or mesenteric edema. Vascular/Lymphatic: Aortic atherosclerosis. No enlarged abdominal or pelvic lymph nodes. Reproductive: Uterus and bilateral adnexa are unremarkable. Other: There is no ascites. There is some stranding in the subcutaneous tissues of the anterior abdominal wall likely related to recent surgery. Musculoskeletal: No acute or significant osseous findings. IMPRESSION: 1. Small hernia in the right groin, possibly inguinal, containing small bowel loop. This is transition point for small bowel obstruction. Findings are concerning for strangulated/incarcerated hernia. 2. Status post cholecystectomy. There is a fluid collection in the  gallbladder fossa, indeterminate. Electronically Signed   By: Darliss Cheney M.D.   On: 02/15/2021 21:05      Assessment & Plan:    Principal Problem:   SBO (small bowel obstruction) (HCC)   Small bowel obstruction Secondary to possibly incarcerated hernia General surgery consulted and plans to see patient in the a.m. N.p.o. Pain control Leukocytosis 12.8, trend in the a.m. Continue to monitor Leukocytosis Likely associated with above Trend in the a.m. GERD Continue Protonix    DVT Prophylaxis-    SCDs   AM Labs Ordered, also please review Full Orders  Family Communication: Admission, patients condition and plan of care including tests being ordered have been discussed with the patient and son who indicate understanding and agree with the plan and Code Status.  Code Status:  fill  Admission status: Inpatient :The appropriate admission status for this patient is INPATIENT. Inpatient status is judged to be reasonable and necessary in order to provide the required intensity of service to ensure the patient's safety. The patient's presenting symptoms, physical exam findings, and initial radiographic and laboratory data in the context of their chronic comorbidities is felt to place them at high risk for further clinical deterioration. Furthermore, it is not anticipated that the patient will be medically stable for discharge from the hospital within 2 midnights of admission. The following factors support the admission status of inpatient.     The patient's presenting symptoms include abdominal pain abdominal tenderness. The worrisome physical exam findings include tachycardia. The initial radiographic and laboratory data are worrisome because of leukocytosis and possible incarcerated hernia.  The chronic co-morbidities include GERD and recent cholecystectomy.       * I certify that at the point of admission it is my clinical judgment that the patient will require inpatient  hospital care spanning beyond 2 midnights from the point of admission due to high intensity of service, high risk for further deterioration and high frequency of surveillance required.*  Disposition: Anticipated Discharge date 48-72 hours discharge to home  Time spent in minutes : 65   Javonne Dorko B Zierle-Ghosh DO

## 2021-02-17 ENCOUNTER — Inpatient Hospital Stay (HOSPITAL_COMMUNITY): Payer: Self-pay

## 2021-02-17 DIAGNOSIS — R112 Nausea with vomiting, unspecified: Secondary | ICD-10-CM

## 2021-02-17 DIAGNOSIS — Z789 Other specified health status: Secondary | ICD-10-CM

## 2021-02-17 DIAGNOSIS — K5900 Constipation, unspecified: Secondary | ICD-10-CM

## 2021-02-17 LAB — BASIC METABOLIC PANEL
Anion gap: 10 (ref 5–15)
BUN: 26 mg/dL — ABNORMAL HIGH (ref 8–23)
CO2: 22 mmol/L (ref 22–32)
Calcium: 8.6 mg/dL — ABNORMAL LOW (ref 8.9–10.3)
Chloride: 103 mmol/L (ref 98–111)
Creatinine, Ser: 0.65 mg/dL (ref 0.44–1.00)
GFR, Estimated: >60 mL/min (ref >60)
Glucose, Bld: 92 mg/dL (ref 70–99)
Potassium: 3.7 mmol/L (ref 3.5–5.1)
Sodium: 135 mmol/L (ref 135–145)

## 2021-02-17 MED ORDER — PANTOPRAZOLE SODIUM 40 MG PO TBEC
40.0000 mg | DELAYED_RELEASE_TABLET | Freq: Two times a day (BID) | ORAL | Status: DC
  Administered 2021-02-17 – 2021-02-19 (×4): 40 mg via ORAL
  Filled 2021-02-17 (×4): qty 1

## 2021-02-17 MED ORDER — SODIUM CHLORIDE 0.9 % IV SOLN
6.2500 mg | Freq: Four times a day (QID) | INTRAVENOUS | Status: DC | PRN
  Filled 2021-02-17: qty 0.25

## 2021-02-17 MED ORDER — ONDANSETRON HCL 4 MG PO TABS
4.0000 mg | ORAL_TABLET | Freq: Four times a day (QID) | ORAL | Status: DC
  Administered 2021-02-17 – 2021-02-19 (×9): 4 mg via ORAL
  Filled 2021-02-17 (×9): qty 1

## 2021-02-17 NOTE — Progress Notes (Signed)
[facility] Progress Note     Subjective: Patient having Bms but still with nausea. Says her nausea is in her throat and chest and she eats or drinks and then spits it back up. This is improving though and abdominal pain improving. Slowly getting better.   Objective: Vital signs in last 24 hours: Temp:  [97.4 F (36.3 C)-98 F (36.7 C)] 98 F (36.7 C) (12/10 0400) Pulse Rate:  [99-105] 99 (12/10 0400) Resp:  [18-20] 20 (12/09 2054) BP: (121-131)/(76-84) 121/76 (12/10 0400) SpO2:  [98 %-100 %] 99 % (12/10 0400) Last BM Date: 02/16/21  Intake/Output from previous day: 12/09 0701 - 12/10 0700 In: 1721.7 [P.O.:460; I.V.:1261.7] Out: -  Intake/Output this shift: No intake/output data recorded.  General appearance: alert and no distress Resp: normal work of breathing GI: soft, nondistended, mildly tender throughout, no right inguinal hernia noted, port site healing   Lab Results:  Recent Labs    02/15/21 1744 02/16/21 0825  WBC 12.8* 9.1  HGB 15.8* 13.9  HCT 45.7 41.0  PLT 342 291   BMET Recent Labs    02/16/21 0825 02/17/21 0409  NA 133* 135  K 3.7 3.7  CL 100 103  CO2 24 22  GLUCOSE 99 92  BUN 33* 26*  CREATININE 0.71 0.65  CALCIUM 8.5* 8.6*   PT/INR No results for input(s): LABPROT, INR in the last 72 hours.  Studies/Results: DG Abd 1 View  Result Date: 02/16/2021 CLINICAL DATA:  Abdominal pain EXAM: ABDOMEN - 1 VIEW COMPARISON:  Previous studies including the examination of 02/15/2021 FINDINGS: There is abnormal dilation of multiple small bowel loops measuring up to 4.9 cm in diameter. Stomach is not distended. Gas and stool are noted in the colon. IMPRESSION: There is abnormal dilation of multiple small bowel loops suggesting high-grade partial small bowel obstruction. Electronically Signed   By: Ernie Avena M.D.   On: 02/16/2021 11:37   CT Abdomen Pelvis W Contrast  Result Date: 02/15/2021 CLINICAL DATA:  Acute abdominal pain.  Fever. Post cholecystectomy 1 week ago. EXAM: CT ABDOMEN AND PELVIS WITH CONTRAST TECHNIQUE: Multidetector CT imaging of the abdomen and pelvis was performed using the standard protocol following bolus administration of intravenous contrast. CONTRAST:  OMNIPAQUE IOHEXOL 300 MG/ML  SOLN COMPARISON:  Ultrasound abdomen 02/02/2021. FINDINGS: Lower chest: No acute abnormality. Hepatobiliary: Status post cholecystectomy. There is a fluid collection in the gallbladder fossa measuring 3.5 x 1.8 x 2.1. There is a tiny focus of air near the gallbladder fossa, likely postoperative. There is no biliary ductal dilatation. There is a hypodense lesion in the left lobe of the liver measuring 1 cm which is too small to characterize. The liver otherwise appears within normal limits. Pancreas: Unremarkable. No pancreatic ductal dilatation or surrounding inflammatory changes. Spleen: Small in size. Adrenals/Urinary Tract: Adrenal glands are unremarkable. Kidneys are normal, without renal calculi, focal lesion, or hydronephrosis. Bladder is unremarkable. Stomach/Bowel: There is a small hernia in the right groin containing a loop of small bowel. This is transition point for small bowel obstruction. Small bowel loops proximal to this level are dilated and fluid-filled measuring up to 3.5 cm. Stomach is nondilated. Small bowel distal to this level is decompressed. The appendix is within normal limits. There is no free air, pneumatosis or mesenteric edema. Vascular/Lymphatic: Aortic atherosclerosis. No enlarged abdominal or pelvic lymph nodes. Reproductive: Uterus and bilateral adnexa are unremarkable. Other: There is no ascites. There is some stranding in the subcutaneous tissues of the anterior abdominal  wall likely related to recent surgery. Musculoskeletal: No acute or significant osseous findings. IMPRESSION: 1. Small hernia in the right groin, possibly inguinal, containing small bowel loop. This is transition point for small  bowel obstruction. Findings are concerning for strangulated/incarcerated hernia. 2. Status post cholecystectomy. There is a fluid collection in the gallbladder fossa, indeterminate. Electronically Signed   By: Darliss Cheney M.D.   On: 02/15/2021 21:05   DG Abd Portable 2V  Result Date: 02/17/2021 CLINICAL DATA:  Lower abdominal pain, vomiting. EXAM: PORTABLE ABDOMEN - 2 VIEW COMPARISON:  February 16, 2021. FINDINGS: Continued small bowel dilatation is noted concerning for distal small bowel obstruction. There is no evidence of free air. No radio-opaque calculi or other significant radiographic abnormality is seen. IMPRESSION: Continued small bowel dilatation is noted concerning for distal small bowel obstruction. Electronically Signed   By: Lupita Raider M.D.   On: 02/17/2021 08:29    Anti-infectives: Anti-infectives (From admission, onward)    None       Assessment/Plan: Patient with SBO from R inguinal hernia, hernia reduced and having Bms. Still with nausea. Is feeling some better but going slow.  Full liquid diet later today and told her to just try Zofran oral scheduled PRN phenergan Again do not think needs OR for hernia right now and would be better if can get a few weeks out from cholecystectomy. Updated team.    LOS: 2 days    Lucretia Roers 02/17/2021

## 2021-02-17 NOTE — Progress Notes (Addendum)
Patient states that whenever she swallows pills it feels like they are getting stuck in her throat. MD Laural Benes notified. MD ordered GI consult. Patient notified.

## 2021-02-17 NOTE — Progress Notes (Signed)
PROGRESS NOTE   Janice Wells  ZOX:[id number] DOB: [birth year] DOA: 02/15/2021 PCP: Jacquelin Hawking, PA-C   Chief Complaint  Patient presents with   Post-op Problem   Level of care: Telemetry  A QUALIFIED MEDICAL SPANISH INTERPRETER USED TO COMMUNICATE WITH PATIENT AT ALL TIMES   Brief Admission History:   60 y.o. female, history of recent cholecystectomy, GERD, obesity, presents ED with a chief complaint of abdominal pain.  Patient reports that she had cholecystectomy on November 26.  She was discharged on that Saturday.  Since then she has not been able to have a bowel movement.  Patient reports 0 appetite because she has a sensation of fullness and bloating.  Patient reports she has not been eating, but when she tries to eat she vomits.  The emesis appears as green liquid.  Patient has generalized weakness.  She reports a decrease in urine output as well.  Patient reports that her pain is periumbilical and is burning.  She was admitted with an incarcerated inguinal hernia causing a high grade small bowel obstruction.  It has been manually reduced by surgery and she has started to have recovery of bowel function.   Assessment & Plan:   Principal Problem:   SBO (small bowel obstruction) (HCC) Active Problems:   GERD without esophagitis   Generalized abdominal pain   Intractable nausea and vomiting   Constipation   Non-English speaking patient   Right inguinal hernia  Incarcerated inguinal hernia -Status post manual reduction by surgery -Bowel function returning  High grade SBO -clinically improving after hernia reduction -bowel function returning -diet being advanced slowly as tolerated  -advancing to full liquids 02/17/21 -start ambulating in room, hallways  Leukocytosis - reactive  - resolved now with supportive measures   GERD  - continue protonix as ordered for GI protection  - recommend outpatient GI follow up   DVT prophylaxis: SCDs/ambulation  Code  Status: full  Family Communication: son at bedside updated 12/9, 12/10 Disposition: Home  Status is: Inpatient  Remains inpatient appropriate because: IV fluids, subspecialty care required   Consultants:  Surgery   Procedures:  Manual hernia reduction   Antimicrobials:  N/a    Subjective: Pt reports abdominal pain after eating, having small stools now and passing flatus.   Objective: Vitals:   02/16/21 0754 02/16/21 1244 02/16/21 2054 02/17/21 0400  BP:  131/78 126/84 121/76  Pulse:  (!) 102 (!) 105 99  Resp:  18 20   Temp:  (!) 97.4 F (36.3 C) 97.8 F (36.6 C) 98 F (36.7 C)  TempSrc:   Oral Oral  SpO2: 99% 98% 100% 99%  Weight:      Height:        Intake/Output Summary (Last 24 hours) at 02/17/2021 1051 Last data filed at 02/17/2021 0900 Gross per 24 hour  Intake 1961.68 ml  Output --  Net 1961.68 ml   Filed Weights   02/16/21 0019  Weight: 58.7 kg   Examination:  General exam: Appears calm and comfortable.    Respiratory system: Clear to auscultation. Respiratory effort normal. Cardiovascular system: normal S1 & S2 heard. No JVD, murmurs, rubs, gallops or clicks. No pedal edema. Gastrointestinal system: Abdomen is nondistended, soft and nontender. No organomegaly or masses felt. Normal bowel sounds heard. Central nervous system: Alert and oriented. No focal neurological deficits. Extremities: Symmetric 5 x 5 power. Skin: No rashes, lesions or ulcers Psychiatry: Judgement and insight appear normal. Mood & affect appropriate.   Data Reviewed: I have personally  reviewed following labs and imaging studies  CBC: Recent Labs  Lab 02/15/21 1744 02/16/21 0825  WBC 12.8* 9.1  NEUTROABS  --  6.0  HGB 15.8* 13.9  HCT 45.7 41.0  MCV 86.2 88.0  PLT 342 291    Basic Metabolic Panel: Recent Labs  Lab 02/15/21 1744 02/16/21 0825 02/17/21 0409  NA 132* 133* 135  K 4.1 3.7 3.7  CL 94* 100 103  CO2 22 24 22   GLUCOSE 123* 99 92  BUN 40* 33* 26*   CREATININE 0.82 0.71 0.65  CALCIUM 9.1 8.5* 8.6*  MG  --  2.3  --     GFR: Estimated Creatinine Clearance: 55.3 mL/min (by C-G formula based on SCr of 0.65 mg/dL).  Liver Function Tests: Recent Labs  Lab 02/15/21 1744 02/16/21 0825  AST 36 39  ALT 41 41  ALKPHOS 99 92  BILITOT 1.4* 0.9  PROT 8.7* 7.3  ALBUMIN 4.0 3.4*    CBG: No results for input(s): GLUCAP in the last 168 hours.  Recent Results (from the past 240 hour(s))  Resp Panel by RT-PCR (Flu A&B, Covid) Nasopharyngeal Swab     Status: None   Collection Time: 02/15/21 10:02 PM   Specimen: Nasopharyngeal Swab; Nasopharyngeal(NP) swabs in vial transport medium  Result Value Ref Range Status   SARS Coronavirus 2 by RT PCR NEGATIVE NEGATIVE Final    Comment: (NOTE) SARS-CoV-2 target nucleic acids are NOT DETECTED.  The SARS-CoV-2 RNA is generally detectable in upper respiratory specimens during the acute phase of infection. The lowest concentration of SARS-CoV-2 viral copies this assay can detect is 138 copies/mL. A negative result does not preclude SARS-Cov-2 infection and should not be used as the sole basis for treatment or other patient management decisions. A negative result may occur with  improper specimen collection/handling, submission of specimen other than nasopharyngeal swab, presence of viral mutation(s) within the areas targeted by this assay, and inadequate number of viral copies(<138 copies/mL). A negative result must be combined with clinical observations, patient history, and epidemiological information. The expected result is Negative.  Fact Sheet for Patients:  14/08/22  Fact Sheet for Healthcare Providers:  BloggerCourse.com  This test is no t yet approved or cleared by the [employer] FDA and  has been authorized for detection and/or diagnosis of SARS-CoV-2 by FDA under an Emergency Use Authorization (EUA). This EUA will remain   in effect (meaning this test can be used) for the duration of the COVID-19 declaration under Section 564(b)(1) of the Act, 21 U.S.C.section 360bbb-3(b)(1), unless the authorization is terminated  or revoked sooner.       Influenza A by PCR NEGATIVE NEGATIVE Final   Influenza B by PCR NEGATIVE NEGATIVE Final    Comment: (NOTE) The Xpert Xpress SARS-CoV-2/FLU/RSV plus assay is intended as an aid in the diagnosis of influenza from Nasopharyngeal swab specimens and should not be used as a sole basis for treatment. Nasal washings and aspirates are unacceptable for Xpert Xpress SARS-CoV-2/FLU/RSV testing.  Fact Sheet for Patients: [place]  Fact Sheet for Healthcare Providers: BloggerCourse.com  This test is not yet approved or cleared by the [employer] FDA and has been authorized for detection and/or diagnosis of SARS-CoV-2 by FDA under an Emergency Use Authorization (EUA). This EUA will remain in effect (meaning this test can be used) for the duration of the COVID-19 declaration under Section 564(b)(1) of the Act, 21 U.S.C. section 360bbb-3(b)(1), unless the authorization is terminated or revoked.  Performed at [facility],  [address], [place], [place] [address]      Radiology Studies: DG Abd 1 View  Result Date: 02/16/2021 CLINICAL DATA:  Abdominal pain EXAM: ABDOMEN - 1 VIEW COMPARISON:  Previous studies including the examination of 02/15/2021 FINDINGS: There is abnormal dilation of multiple small bowel loops measuring up to 4.9 cm in diameter. Stomach is not distended. Gas and stool are noted in the colon. IMPRESSION: There is abnormal dilation of multiple small bowel loops suggesting high-grade partial small bowel obstruction. Electronically Signed   By: Ernie Avena M.D.   On: 02/16/2021 11:37   CT Abdomen Pelvis W Contrast  Result Date: 02/15/2021 CLINICAL DATA:  Acute abdominal pain. Fever. Post  cholecystectomy 1 week ago. EXAM: CT ABDOMEN AND PELVIS WITH CONTRAST TECHNIQUE: Multidetector CT imaging of the abdomen and pelvis was performed using the standard protocol following bolus administration of intravenous contrast. CONTRAST:  OMNIPAQUE IOHEXOL 300 MG/ML  SOLN COMPARISON:  Ultrasound abdomen 02/02/2021. FINDINGS: Lower chest: No acute abnormality. Hepatobiliary: Status post cholecystectomy. There is a fluid collection in the gallbladder fossa measuring 3.5 x 1.8 x 2.1. There is a tiny focus of air near the gallbladder fossa, likely postoperative. There is no biliary ductal dilatation. There is a hypodense lesion in the left lobe of the liver measuring 1 cm which is too small to characterize. The liver otherwise appears within normal limits. Pancreas: Unremarkable. No pancreatic ductal dilatation or surrounding inflammatory changes. Spleen: Small in size. Adrenals/Urinary Tract: Adrenal glands are unremarkable. Kidneys are normal, without renal calculi, focal lesion, or hydronephrosis. Bladder is unremarkable. Stomach/Bowel: There is a small hernia in the right groin containing a loop of small bowel. This is transition point for small bowel obstruction. Small bowel loops proximal to this level are dilated and fluid-filled measuring up to 3.5 cm. Stomach is nondilated. Small bowel distal to this level is decompressed. The appendix is within normal limits. There is no free air, pneumatosis or mesenteric edema. Vascular/Lymphatic: Aortic atherosclerosis. No enlarged abdominal or pelvic lymph nodes. Reproductive: Uterus and bilateral adnexa are unremarkable. Other: There is no ascites. There is some stranding in the subcutaneous tissues of the anterior abdominal wall likely related to recent surgery. Musculoskeletal: No acute or significant osseous findings. IMPRESSION: 1. Small hernia in the right groin, possibly inguinal, containing small bowel loop. This is transition point for small bowel  obstruction. Findings are concerning for strangulated/incarcerated hernia. 2. Status post cholecystectomy. There is a fluid collection in the gallbladder fossa, indeterminate. Electronically Signed   By: Darliss Cheney M.D.   On: 02/15/2021 21:05   DG Abd Portable 2V  Result Date: 02/17/2021 CLINICAL DATA:  Lower abdominal pain, vomiting. EXAM: PORTABLE ABDOMEN - 2 VIEW COMPARISON:  February 16, 2021. FINDINGS: Continued small bowel dilatation is noted concerning for distal small bowel obstruction. There is no evidence of free air. No radio-opaque calculi or other significant radiographic abnormality is seen. IMPRESSION: Continued small bowel dilatation is noted concerning for distal small bowel obstruction. Electronically Signed   By: Lupita Raider M.D.   On: 02/17/2021 08:29    Scheduled Meds:  bisacodyl  10 mg Rectal Daily   feeding supplement  1 Container Oral TID BM   multivitamin with minerals  1 tablet Oral Daily   ondansetron  4 mg Oral Q6H   pantoprazole  40 mg Oral Daily   Continuous Infusions:  sodium chloride 50 mL/hr at 02/17/21 1028   promethazine (PHENERGAN) injection (IM or IVPB)  LOS: 2 days   Time spent: 40 mins   Amillion Macchia Laural Benes, MD How to contact the [facility] Attending or Consulting provider 7A - 7P or covering provider during after hours 7P -7A, for this patient?  Check the care team in [facility] and look for a) attending/consulting [facility] provider listed and b) the [facility] team listed Log into [email or web address] and use [place]'s universal password to access. If you do not have the password, please contact the hospital operator. Locate the [facility] provider you are looking for under [facility] and page to a number that you can be directly reached. If you still have difficulty reaching the provider, please page the [facility] (Director on Call) for the Hospitalists listed on amion for assistance.  02/17/2021, 10:51 AM

## 2021-02-18 ENCOUNTER — Telehealth: Payer: Self-pay | Admitting: Internal Medicine

## 2021-02-18 DIAGNOSIS — R1084 Generalized abdominal pain: Secondary | ICD-10-CM

## 2021-02-18 DIAGNOSIS — K219 Gastro-esophageal reflux disease without esophagitis: Secondary | ICD-10-CM

## 2021-02-18 DIAGNOSIS — R1312 Dysphagia, oropharyngeal phase: Secondary | ICD-10-CM

## 2021-02-18 MED ORDER — DOCUSATE SODIUM 100 MG PO CAPS
100.0000 mg | ORAL_CAPSULE | Freq: Two times a day (BID) | ORAL | Status: DC
  Administered 2021-02-18 – 2021-02-19 (×2): 100 mg via ORAL
  Filled 2021-02-18 (×2): qty 1

## 2021-02-18 NOTE — Evaluation (Addendum)
Physical Therapy Evaluation Patient Details Name: Janice Wells MRN: [id number] DOB: 01-21-1961 Today's Date: 02/18/2021  History of Present Illness  Janice Wells  is a 60 y.o. female, history of recent cholecystectomy, GERD, obesity, presents ED with a chief complaint of abdominal pain.  Patient reports that she had cholecystectomy on November 26.  She was discharged on that Saturday.  Since then she has not been able to have a bowel movement.  Patient reports 0 appetite because she has a sensation of fullness and bloating.  Patient reports she has not been eating, but when she tries to eat she vomits.  The emesis appears as green liquid.  Patient has generalized weakness.  She reports a decrease in urine output as well.  Patient reports that her pain is periumbilical and is burning.  She has not tried any pain medication at home but morphine helped in the ED.  Today when the pain was at its worst it was radiating to her chest and back, but the pain is resolved at this time.  Patient reports that she is not only not able to keep down solid foods, but not able to keep down water either.  Patient denies any fevers.  Patient has no other complaints.   Clinical Impression  Patient functioning at baseline for functional mobility and gait demonstrating good return for bed mobility, sit to stands, transfers and ambulation in room/hallways without loss of balance and without need for an AD.  Plan:  Patient discharged from physical therapy to care of nursing for ambulation daily as tolerated for length of stay.         Recommendations for follow up therapy are one component of a multi-disciplinary discharge planning process, led by the attending physician.  Recommendations may be updated based on patient status, additional functional criteria and insurance authorization.  Follow Up Recommendations No PT follow up    Assistance Recommended at Discharge PRN  Functional Status  Assessment Patient has not had a recent decline in their functional status  Equipment Recommendations  None recommended by PT    Recommendations for Other Services       Precautions / Restrictions Precautions Precautions: None Restrictions Weight Bearing Restrictions: No      Mobility  Bed Mobility Overal bed mobility: Independent                  Transfers Overall transfer level: Independent                      Ambulation/Gait Ambulation/Gait assistance: Modified independent (Device/Increase time) Gait Distance (Feet): 100 Feet Assistive device: None;IV Pole Gait Pattern/deviations: WFL(Within Functional Limits) Gait velocity: decreased     General Gait Details: grossly WFL with good return for ambulation in room and hallway without loss of balance without AD and pushing IV pole  Stairs            Wheelchair Mobility    Modified Rankin (Stroke Patients Only)       Balance Overall balance assessment: No apparent balance deficits (not formally assessed)                                           Pertinent Vitals/Pain Pain Assessment: No/denies pain    Home Living Family/patient expects to be discharged to:: Private residence Living Arrangements: Children Available Help at Discharge: Family;Available PRN/intermittently Type of Home: Mobile home  Home Access: Stairs to enter   Entrance Stairs-Number of Steps: 2-3   Home Layout: One level        Prior Function Prior Level of Function : Independent/Modified Independent             Mobility Comments: community ambulator ADLs Comments: assisted by family     Hand Dominance        Extremity/Trunk Assessment   Upper Extremity Assessment Upper Extremity Assessment: Overall WFL for tasks assessed    Lower Extremity Assessment Lower Extremity Assessment: Overall WFL for tasks assessed    Cervical / Trunk Assessment Cervical / Trunk Assessment: Normal   Communication      Cognition Arousal/Alertness: Awake/alert Behavior During Therapy: WFL for tasks assessed/performed Overall Cognitive Status: Within Functional Limits for tasks assessed                                          General Comments      Exercises     Assessment/Plan    PT Assessment Patient does not need any further PT services  PT Problem List         PT Treatment Interventions      PT Goals (Current goals can be found in the Care Plan section)  Acute Rehab PT Goals Patient Stated Goal: return home with family to assist PT Goal Formulation: With patient Time For Goal Achievement: 02/18/21 Potential to Achieve Goals: Good    Frequency     Barriers to discharge        Co-evaluation               AM-PAC PT "6 Clicks" Mobility  Outcome Measure Help needed turning from your back to your side while in a flat bed without using bedrails?: None Help needed moving from lying on your back to sitting on the side of a flat bed without using bedrails?: None Help needed moving to and from a bed to a chair (including a wheelchair)?: None Help needed standing up from a chair using your arms (e.g., wheelchair or bedside chair)?: None Help needed to walk in hospital room?: None Help needed climbing 3-5 steps with a railing? : None 6 Click Score: 24    End of Session   Activity Tolerance: Patient tolerated treatment well Patient left: in chair Nurse Communication: Mobility status PT Visit Diagnosis: Other abnormalities of gait and mobility (R26.89);Unsteadiness on feet (R26.81);Muscle weakness (generalized) (M62.81)    Time: 9935-7017 PT Time Calculation (min) (ACUTE ONLY): 30 min   Charges:   PT Evaluation $PT Eval Moderate Complexity: 1 Mod PT Treatments $Therapeutic Activity: 23-37 mins        12:20 PM, 02/18/21 Ocie Bob, MPT Physical Therapist with [facility] [phone] office [phone] mobile  phone

## 2021-02-18 NOTE — Progress Notes (Signed)
[facility]  Continues to have Bms but says they were small. Says she feels like she cannot swallow the food. She says the broth makes her feel sick.   BP 135/80 (BP Location: Right Arm)   Pulse 89   Temp 98.5 F (36.9 C) (Oral)   Resp 18   Ht 4\' 10"  (1.473 m)   Wt 79.6 kg   LMP 03/11/2008 (Approximate)   SpO2 100%   BMI 36.66 kg/m  Soft, nondistended, nontender, hernia reduced on right  Patient with SBO from R inguinal hernia, reduced. Still with some nausea but overall improved. Having Bms.   Soft diet Encouraged her to go slow Colace added Hopefully home tomorrow  05/09/2008, MD [facility] [address] [place], [place] [phone] (office)

## 2021-02-18 NOTE — Progress Notes (Signed)
PROGRESS NOTE   Janice Wells  QQV:[id number] DOB: 1960-05-14 DOA: 02/15/2021 PCP: Jacquelin Hawking, PA-C   Chief Complaint  Patient presents with   Post-op Problem   Level of care: Telemetry  A QUALIFIED MEDICAL SPANISH INTERPRETER USED TO COMMUNICATE WITH PATIENT AT ALL TIMES   Brief Admission History:   60 y.o. female, history of recent cholecystectomy, GERD, obesity, presents ED with a chief complaint of abdominal pain.  Patient reports that she had cholecystectomy on November 26.  She was discharged on that Saturday.  Since then she has not been able to have a bowel movement.  Patient reports 0 appetite because she has a sensation of fullness and bloating.  Patient reports she has not been eating, but when she tries to eat she vomits.  The emesis appears as green liquid.  Patient has generalized weakness.  She reports a decrease in urine output as well.  Patient reports that her pain is periumbilical and is burning.  She was admitted with an incarcerated inguinal hernia causing a high grade small bowel obstruction.  It has been manually reduced by surgery and she has started to have recovery of bowel function.   Assessment & Plan:   Principal Problem:   SBO (small bowel obstruction) (HCC) Active Problems:   GERD without esophagitis   Generalized abdominal pain   Intractable nausea and vomiting   Constipation   Non-English speaking patient   Right inguinal hernia  Incarcerated inguinal hernia -Status post manual reduction by surgery -Bowel function returning -continue supportive measures   High grade SBO -clinically improving after hernia reduction -bowel function returning -diet being advanced slowly as tolerated  -advancing to full liquids pending GI/surgery evaluation -continue ambulating in room, hallways  Leukocytosis - reactive  - resolved now with supportive measures   GERD / difficulty swallowing  - continue protonix as ordered for GI protection   - GI consulted given ongoing complaints of difficulty swallowing   DVT prophylaxis: SCDs/ambulation  Code Status: full  Family Communication: son at bedside updated 12/9, 12/10 Disposition: Home  Status is: Inpatient  Remains inpatient appropriate because: IV fluids, subspecialty care required   Consultants:  Surgery   Procedures:  Manual hernia reduction by surgery team  Antimicrobials:  N/a    Subjective: Pt reports less discomfort with eating today, still having flatus and BMs, still complains of food and pills getting stuck in esophagus.   Objective: Vitals:   02/17/21 1440 02/17/21 2230 02/18/21 0500 02/18/21 0607  BP: 129/69 129/70  (!) 104/56  Pulse: 99 89  87  Resp:  20  18  Temp: 98.2 F (36.8 C) 98.2 F (36.8 C)  98.1 F (36.7 C)  TempSrc: Oral Oral  Oral  SpO2: 98% 100%  96%  Weight:   79.6 kg   Height:        Intake/Output Summary (Last 24 hours) at 02/18/2021 0910 Last data filed at 02/17/2021 2043 Gross per 24 hour  Intake 740 ml  Output --  Net 740 ml   Filed Weights   02/16/21 0019 02/18/21 0500  Weight: 58.7 kg 79.6 kg   Examination:  General exam: Appears calm and comfortable.    Respiratory system: Clear to auscultation. Respiratory effort normal. Cardiovascular system: normal S1 & S2 heard. No JVD, murmurs, rubs, gallops or clicks. No pedal edema. Gastrointestinal system: Abdomen is nondistended, soft and nontender. No organomegaly or masses felt. Normal bowel sounds heard. Central nervous system: Alert and oriented. No focal neurological deficits. Extremities: Symmetric 5  x 5 power. Skin: No rashes, lesions or ulcers Psychiatry: Judgement and insight appear normal. Mood & affect appropriate.   Data Reviewed: I have personally reviewed following labs and imaging studies  CBC: Recent Labs  Lab 02/15/21 1744 02/16/21 0825  WBC 12.8* 9.1  NEUTROABS  --  6.0  HGB 15.8* 13.9  HCT 45.7 41.0  MCV 86.2 88.0  PLT 342 291     Basic Metabolic Panel: Recent Labs  Lab 02/15/21 1744 02/16/21 0825 02/17/21 0409  NA 132* 133* 135  K 4.1 3.7 3.7  CL 94* 100 103  CO2 22 24 22   GLUCOSE 123* 99 92  BUN 40* 33* 26*  CREATININE 0.82 0.71 0.65  CALCIUM 9.1 8.5* 8.6*  MG  --  2.3  --     GFR: Estimated Creatinine Clearance: 64.9 mL/min (by C-G formula based on SCr of 0.65 mg/dL).  Liver Function Tests: Recent Labs  Lab 02/15/21 1744 02/16/21 0825  AST 36 39  ALT 41 41  ALKPHOS 99 92  BILITOT 1.4* 0.9  PROT 8.7* 7.3  ALBUMIN 4.0 3.4*    CBG: No results for input(s): GLUCAP in the last 168 hours.  Recent Results (from the past 240 hour(s))  Resp Panel by RT-PCR (Flu A&B, Covid) Nasopharyngeal Swab     Status: None   Collection Time: 02/15/21 10:02 PM   Specimen: Nasopharyngeal Swab; Nasopharyngeal(NP) swabs in vial transport medium  Result Value Ref Range Status   SARS Coronavirus 2 by RT PCR NEGATIVE NEGATIVE Final    Comment: (NOTE) SARS-CoV-2 target nucleic acids are NOT DETECTED.  The SARS-CoV-2 RNA is generally detectable in upper respiratory specimens during the acute phase of infection. The lowest concentration of SARS-CoV-2 viral copies this assay can detect is 138 copies/mL. A negative result does not preclude SARS-Cov-2 infection and should not be used as the sole basis for treatment or other patient management decisions. A negative result may occur with  improper specimen collection/handling, submission of specimen other than nasopharyngeal swab, presence of viral mutation(s) within the areas targeted by this assay, and inadequate number of viral copies(<138 copies/mL). A negative result must be combined with clinical observations, patient history, and epidemiological information. The expected result is Negative.  Fact Sheet for Patients:  14/08/22  Fact Sheet for Healthcare Providers:  BloggerCourse.com  This test is  no t yet approved or cleared by the [employer] FDA and  has been authorized for detection and/or diagnosis of SARS-CoV-2 by FDA under an Emergency Use Authorization (EUA). This EUA will remain  in effect (meaning this test can be used) for the duration of the COVID-19 declaration under Section 564(b)(1) of the Act, 21 U.S.C.section 360bbb-3(b)(1), unless the authorization is terminated  or revoked sooner.       Influenza A by PCR NEGATIVE NEGATIVE Final   Influenza B by PCR NEGATIVE NEGATIVE Final    Comment: (NOTE) The Xpert Xpress SARS-CoV-2/FLU/RSV plus assay is intended as an aid in the diagnosis of influenza from Nasopharyngeal swab specimens and should not be used as a sole basis for treatment. Nasal washings and aspirates are unacceptable for Xpert Xpress SARS-CoV-2/FLU/RSV testing.  Fact Sheet for Patients: [place]  Fact Sheet for Healthcare Providers: BloggerCourse.com  This test is not yet approved or cleared by the [employer]ious[employer] FDA and has been authorized for detection and/or diagnosis of SARS-CoV-2 by FDA under an Emergency Use Authorization (EUA). This EUA will remain in effect (meaning this test can be used) for the duration of  the COVID-19 declaration under Section 564(b)(1) of the Act, 21 U.S.C. section 360bbb-3(b)(1), unless the authorization is terminated or revoked.  Performed at [facility], [address], [place], [place] [address]      Radiology Studies: DG Abd 1 View  Result Date: 02/16/2021 CLINICAL DATA:  Abdominal pain EXAM: ABDOMEN - 1 VIEW COMPARISON:  Previous studies including the examination of 02/15/2021 FINDINGS: There is abnormal dilation of multiple small bowel loops measuring up to 4.9 cm in diameter. Stomach is not distended. Gas and stool are noted in the colon. IMPRESSION: There is abnormal dilation of multiple small bowel loops suggesting high-grade partial small bowel  obstruction. Electronically Signed   By: Ernie Avena M.D.   On: 02/16/2021 11:37   DG Abd Portable 2V  Result Date: 02/17/2021 CLINICAL DATA:  Lower abdominal pain, vomiting. EXAM: PORTABLE ABDOMEN - 2 VIEW COMPARISON:  February 16, 2021. FINDINGS: Continued small bowel dilatation is noted concerning for distal small bowel obstruction. There is no evidence of free air. No radio-opaque calculi or other significant radiographic abnormality is seen. IMPRESSION: Continued small bowel dilatation is noted concerning for distal small bowel obstruction. Electronically Signed   By: Lupita Raider M.D.   On: 02/17/2021 08:29    Scheduled Meds:  bisacodyl  10 mg Rectal Daily   feeding supplement  1 Container Oral TID BM   multivitamin with minerals  1 tablet Oral Daily   ondansetron  4 mg Oral Q6H   pantoprazole  40 mg Oral BID   Continuous Infusions:  sodium chloride 50 mL/hr at 02/17/21 2052   promethazine (PHENERGAN) injection (IM or IVPB)       LOS: 3 days   Time spent: 38 mins   Molleigh Huot Laural Benes, MD How to contact the [facility] Attending or Consulting provider 7A - 7P or covering provider during after hours 7P -7A, for this patient?  Check the care team in [facility] and look for a) attending/consulting [facility] provider listed and b) the [facility] team listed Log into [email or web address] and use [facility] universal password to access. If you do not have the password, please contact the hospital operator. Locate the [facility] provider you are looking for under [facility] and page to a number that you can be directly reached. If you still have difficulty reaching the provider, please page the [facility] (Director on Call) for the Hospitalists listed on amion for assistance.  02/18/2021, 9:10 AM

## 2021-02-18 NOTE — Consult Note (Signed)
Consulting  Provider: Dr. Wynetta Emery Primary Care Physician:  Soyla Dryer, PA-C Primary Gastroenterologist: Previously unassigned, Dr. Abbey Chatters  Reason for Consultation: Dysphagia  HPI:  Janice Wells is a 60 y.o. female with a past medical history of GERD, obesity, recent cholecystectomy, who presented to [facility] ER 02/16/2021 chief complaint of abdominal pain as well as nausea and vomiting.  She underwent recent cholecystectomy February 03, 2021.  CT scan on 02/15/2021 showed small hernia in the right groin possibly inguinal containing small bowel loop with transition point of SBO concerning for strangulated/incarcerated hernia.  This was manually reduced by surgery.    She has slowly been improving from a bowel standpoint.  She notes yesterday she had dysphagia with taking Zofran.  Felt as though it got stuck in the back of her throat.  She notes drinking a lot of water to get it to finally pass.  States this is not a chronic issue for her.  This is the first time this is occurred.  States it occurred yesterday only.  Reports swallowing all her pills without difficulty this morning.  Does note a bitter taste in her mouth as well as heartburn.  Ongoing epigastric pain.  No previous EGD.  Does take pantoprazole 40 mg daily.  No chronic NSAID use.  No history of peptic ulcer disease or H. pylori that she knows of.  No previous colonoscopy in her records.  Patient is Spanish-speaking only.  Entire H&P was performed with use of video interpreter Beverlee Nims  Past Medical History:  Diagnosis Date   Acute cholecystitis 02/02/2021   GERD without esophagitis 11/27/2020   Obesity, unspecified 02/15/2015    Past Surgical History:  Procedure Laterality Date   CHOLECYSTECTOMY N/A 02/03/2021   Procedure: LAPAROSCOPIC CHOLECYSTECTOMY;  Surgeon: Aviva Signs, MD;  Location: [facility];  Service: General;  Laterality: N/A;    Prior to Admission medications   Medication Sig Start Date End Date  Taking? Authorizing Provider  HYDROcodone-acetaminophen (NORCO) 5-325 MG tablet Take 1 tablet by mouth every 4 (four) hours as needed for moderate pain. 02/04/21  Yes Aviva Signs, MD  pantoprazole (PROTONIX) 40 MG tablet Take 1 tablet (40 mg total) by mouth daily. Tome una tableta por boca diaria 02/09/21  Yes Aviva Signs, MD  polyethylene glycol (MIRALAX / GLYCOLAX) 17 g packet Take 17 g by mouth daily. 02/09/21  Yes Aviva Signs, MD    Current Facility-Administered Medications  Medication Dose Route Frequency Provider Last Rate Last Admin   0.9 %  sodium chloride infusion   Intravenous Continuous Wynetta Emery, Clanford L, MD 50 mL/hr at 02/17/21 2052 New Bag at 02/17/21 2052   acetaminophen (TYLENOL) tablet 650 mg  650 mg Oral Q6H PRN Zierle-Ghosh, Asia B, DO       Or   acetaminophen (TYLENOL) suppository 650 mg  650 mg Rectal Q6H PRN Zierle-Ghosh, Asia B, DO       bisacodyl (DULCOLAX) suppository 10 mg  10 mg Rectal Daily Virl Cagey, MD   10 mg at 02/18/21 0849   feeding supplement (BOOST / RESOURCE BREEZE) liquid 1 Container  1 Container Oral TID BM Johnson, Clanford L, MD   1 Container at 02/18/21 0849   morphine 2 MG/ML injection 2 mg  2 mg Intravenous Q3H PRN Wynetta Emery, Clanford L, MD   2 mg at 02/16/21 1731   multivitamin with minerals tablet 1 tablet  1 tablet Oral Daily Wynetta Emery, Clanford L, MD   1 tablet at 02/18/21 0849   ondansetron (ZOFRAN) tablet  4 mg  4 mg Oral Q6H Virl Cagey, MD   4 mg at 02/18/21 0848   oxyCODONE (Oxy IR/ROXICODONE) immediate release tablet 5 mg  5 mg Oral Q4H PRN Zierle-Ghosh, Asia B, DO       pantoprazole (PROTONIX) EC tablet 40 mg  40 mg Oral BID Johnson, Clanford L, MD   40 mg at 02/18/21 0848   promethazine (PHENERGAN) 6.25 mg in sodium chloride 0.9 % 50 mL IVPB  6.25 mg Intravenous Q6H PRN Virl Cagey, MD        Allergies as of 02/15/2021   (No Known Allergies)    Family History  Problem Relation Age of Onset   Diabetes Sister     Cancer Brother     Social History   Socioeconomic History   Marital status: Single    Spouse name: Not on file   Number of children: Not on file   Years of education: Not on file   Highest education level: Not on file  Occupational History   Not on file  Tobacco Use   Smoking status: Never   Smokeless tobacco: Never  Substance and Sexual Activity   Alcohol use: No   Drug use: No   Sexual activity: Not on file  Other Topics Concern   Not on file  Social History Narrative   ** Merged History Encounter **       Social Determinants of Health   Financial Resource Strain: Not on file  Food Insecurity: Not on file  Transportation Needs: Not on file  Physical Activity: Not on file  Stress: Not on file  Social Connections: Not on file  Intimate Partner Violence: Not on file    Review of Systems: General: Negative for anorexia, weight loss, fever, chills, fatigue, weakness. Eyes: Negative for vision changes.  ENT: Negative for hoarseness, difficulty swallowing , nasal congestion. CV: Negative for chest pain, angina, palpitations, dyspnea on exertion, peripheral edema.  Respiratory: Negative for dyspnea at rest, dyspnea on exertion, cough, sputum, wheezing.  GI: See history of present illness. GU:  Negative for dysuria, hematuria, urinary incontinence, urinary frequency, nocturnal urination.  MS: Negative for joint pain, low back pain.  Derm: Negative for rash or itching.  Neuro: Negative for weakness, abnormal sensation, seizure, frequent headaches, memory loss, confusion.  Psych: Negative for anxiety, depression Endo: Negative for unusual weight change.  Heme: Negative for bruising or bleeding. Allergy: Negative for rash or hives.  Physical Exam: Vital signs in last 24 hours: Temp:  [98.1 F (36.7 C)-98.2 F (36.8 C)] 98.1 F (36.7 C) (12/11 0607) Pulse Rate:  [87-99] 87 (12/11 0607) Resp:  [18-20] 18 (12/11 0607) BP: (104-129)/(56-70) 104/56 (12/11 0607) SpO2:   [96 %-100 %] 96 % (12/11 [id number]) Weight:  [79.6 kg] 79.6 kg (12/11 0500) Last BM Date: 02/17/21 General:   Alert,  Well-developed, well-nourished, pleasant and cooperative in NAD Head:  Normocephalic and atraumatic. Eyes:  Sclera clear, no icterus.   Conjunctiva pink. Ears:  Normal auditory acuity. Nose:  No deformity, discharge,  or lesions. Mouth:  No deformity or lesions, dentition normal. Neck:  Supple; no masses or thyromegaly. Lungs:  Clear throughout to auscultation.   No wheezes, crackles, or rhonchi. No acute distress. Heart:  Regular rate and rhythm; no murmurs, clicks, rubs,  or gallops. Abdomen:  Soft, nontender and nondistended. No masses, hepatosplenomegaly or hernias noted. Normal bowel sounds, without guarding, and without rebound.   Msk:  Symmetrical without gross deformities. Normal posture. Pulses:  Normal pulses noted. Extremities:  Without clubbing or edema. Neurologic:  Alert and  oriented x4;  grossly normal neurologically. Skin:  Intact without significant lesions or rashes. Cervical Nodes:  No significant cervical adenopathy. Psych:  Alert and cooperative. Normal mood and affect.  Intake/Output from previous day: 12/10 0701 - 12/11 0700 In: 980 [P.O.:980] Out: -  Intake/Output this shift: Total I/O In: 240 [P.O.:240] Out: -   Lab Results: Recent Labs    02/15/21 1744 02/16/21 0825  WBC 12.8* 9.1  HGB 15.8* 13.9  HCT 45.7 41.0  PLT 342 291   BMET Recent Labs    02/15/21 1744 02/16/21 0825 02/17/21 0409  NA 132* 133* 135  K 4.1 3.7 3.7  CL 94* 100 103  CO2 22 24 22   GLUCOSE 123* 99 92  BUN 40* 33* 26*  CREATININE 0.82 0.71 0.65  CALCIUM 9.1 8.5* 8.6*   LFT Recent Labs    02/15/21 1744 02/16/21 0825  PROT 8.7* 7.3  ALBUMIN 4.0 3.4*  AST 36 39  ALT 41 41  ALKPHOS 99 92  BILITOT 1.4* 0.9   PT/INR No results for input(s): LABPROT, INR in the last 72 hours. Hepatitis Panel No results for input(s): HEPBSAG, HCVAB, HEPAIGM, HEPBIGM  in the last 72 hours. C-Diff No results for input(s): CDIFFTOX in the last 72 hours.  Studies/Results: DG Abd Portable 2V  Result Date: 02/17/2021 CLINICAL DATA:  Lower abdominal pain, vomiting. EXAM: PORTABLE ABDOMEN - 2 VIEW COMPARISON:  February 16, 2021. FINDINGS: Continued small bowel dilatation is noted concerning for distal small bowel obstruction. There is no evidence of free air. No radio-opaque calculi or other significant radiographic abnormality is seen. IMPRESSION: Continued small bowel dilatation is noted concerning for distal small bowel obstruction. Electronically Signed   By: February 18, 2021 M.D.   On: 02/17/2021 08:29    Impression: *Small bowel obstruction *Incarcerated inguinal hernia status post reduction by surgery *Dysphagia with pills *GERD  Plan: Discussed in depth with patient today.  Appears that her dysphagia symptoms only occurred for 1 day when trying to swallow her pills.  States she tolerated all her pills this morning.  Denies any issues in the past with this.  Does note a bitter taste in her mouth consistent with chronic GERD.  Continue on pantoprazole 40 mg twice daily.  We will continue to monitor for now.  If she has recurrence of her symptoms, can consider further work-up with modified barium swallow and/or speech evaluation as her symptoms appear to be more oropharyngeal than esophageal.  Small bowel obstruction management per surgery.  Outpatient follow-up with GI to discuss screening colonoscopy.  GI to sign off.  Please message/call for any questions or concerns or recurrence of patient's symptoms.  Thank you for the consultation.  14/12/2020. Hennie Duos, D.O. Gastroenterology and Hepatology [facility]    LOS: 3 days     02/18/2021, 12:25 PM

## 2021-02-18 NOTE — Telephone Encounter (Signed)
Please arrange hospital follow up for patient dx. GERD, Dysphagia, screening CLN with one of the [facility]. She will need interpreter. Thank you

## 2021-02-19 ENCOUNTER — Encounter: Payer: Self-pay | Admitting: Internal Medicine

## 2021-02-19 MED ORDER — ONDANSETRON HCL 4 MG PO TABS: 4.0000 mg | ORAL_TABLET | Freq: Three times a day (TID) | ORAL | 0 refills | Status: DC | PRN

## 2021-02-19 MED ORDER — HYDROCODONE-ACETAMINOPHEN 5-325 MG PO TABS: 1.0000 | ORAL_TABLET | Freq: Four times a day (QID) | ORAL | 0 refills | Status: AC | PRN

## 2021-02-19 MED ORDER — ADULT MULTIVITAMIN W/MINERALS CH
1.0000 | ORAL_TABLET | Freq: Every day | ORAL | Status: DC
Start: 2021-02-20 — End: 2021-03-14

## 2021-02-19 MED ORDER — DOCUSATE SODIUM 100 MG PO CAPS: 100.0000 mg | ORAL_CAPSULE | Freq: Two times a day (BID) | ORAL | 2 refills | Status: DC

## 2021-02-19 NOTE — Progress Notes (Signed)
  Transition of Care [facility]) Screening Note   Patient Details  Name: [name] Date of Birth: 1961/02/03   Transition of Care [facility]) CM/SW Contact:    Annice Needy, LCSW Phone Number: 02/19/2021, 10:17 AM    Transition of Care Department [facility]) has reviewed patient and no TOC needs have been identified at this time. We will continue to monitor patient advancement through interdisciplinary progression rounds. If new patient transition needs arise, please place a TOC consult.

## 2021-02-19 NOTE — Discharge Summary (Addendum)
Physician Discharge Summary  Mariella Saa JXB:[id number] DOB: [birth year] DOA: 02/15/2021  PCP: Jacquelin Hawking, PA-C Surgery: Dr. Lovell Sheehan  GI: [facility] Dr. Marletta Lor   Admit date: 02/15/2021 Discharge date: 02/19/2021  Admitted From:  Home  Disposition: Home    A QUALIFIED MEDICAL SPANISH INTERPRETER USED TO COMMUNICATE WITH PATIENT AT ALL TIMES   Recommendations for Outpatient Follow-up:  Follow up with PCP in 1 weeks Follow up with Dr. Lovell Sheehan on 12/27 Office will call with appt Follow up with [facility] for colonoscopy: office will call patient with appt  Discharge Condition: STABLE   CODE STATUS: FULL DIET: soft foods     Brief Hospitalization Summary: Please see all hospital notes, images, labs for full details of the hospitalization.  Brief Admission History:   60 y.o. female, history of recent cholecystectomy, GERD, obesity, presents ED with a chief complaint of abdominal pain.  Patient reports that she had cholecystectomy on November 26.  She was discharged on that Saturday.  Since then she has not been able to have a bowel movement.  Patient reports 0 appetite because she has a sensation of fullness and bloating.  Patient reports she has not been eating, but when she tries to eat she vomits.  The emesis appears as green liquid.  Patient has generalized weakness.  She reports a decrease in urine output as well.  Patient reports that her pain is periumbilical and is burning.  She was admitted with an incarcerated inguinal hernia causing a high grade small bowel obstruction.  It has been manually reduced by surgery and she has started to have recovery of bowel function.   HOSPITAL COURSE BY PROBLEM LIST  Incarcerated inguinal hernia - RESOLVED -Status post manual reduction by surgery -Bowel function returned -Pt was treated with supportive measures  -Soft foods diet  - colace 100 mg BID    SBO - resolved  -clinically improved after hernia  reduction -bowel function returned -tolerating soft diet  -surgery says ok to dc home on soft diet, colace BID, zofran, resume home pain meds as needed   Leukocytosis - reactive ---RESOLVED - resolved now with supportive measures    GERD / difficulty swallowing  - continue protonix as ordered for GI protection  - GI consulted AND arranging outpatient follow up - continue protonix recommended.   - [facility] will call her with appointment      DVT prophylaxis: SCDs/ambulation  Code Status: full  Family Communication: son at bedside updated 12/9, 12/10 Disposition: Home   Discharge Diagnoses:  Principal Problem:   SBO (small bowel obstruction) (HCC) Active Problems:   GERD without esophagitis   Generalized abdominal pain   Intractable nausea and vomiting   Constipation   Non-English speaking patient   Right inguinal hernia   Oropharyngeal dysphagia   Discharge Instructions:  Allergies as of 02/19/2021   No Known Allergies      Medication List     STOP taking these medications    polyethylene glycol 17 g packet Commonly known as: MIRALAX / GLYCOLAX       TAKE these medications    docusate sodium 100 MG capsule Commonly known as: Colace Take 1 capsule (100 mg total) by mouth 2 (two) times daily.   HYDROcodone-acetaminophen 5-325 MG tablet Commonly known as: Norco Take 1 tablet by mouth every 6 (six) hours as needed for up to 3 days for severe pain. What changed:  when to take this reasons to take this   multivitamin with minerals Tabs  tablet Take 1 tablet by mouth daily. Start taking on: February 20, 2021   ondansetron 4 MG tablet Commonly known as: ZOFRAN Take 1 tablet (4 mg total) by mouth every 8 (eight) hours as needed for nausea or vomiting.   pantoprazole 40 MG tablet Commonly known as: PROTONIX Take 1 tablet (40 mg total) by mouth daily. Tome una tableta por boca diaria        Follow-up Information     [facility] Follow up on 03/06/2021.   Specialty: General Surgery Why: office will make appt and call Contact information: [address] [place] [address] [phone]        Jacquelin Hawking, PA-C. Schedule an appointment as soon as possible for a visit in 1 week(s).   Specialty: Physician Assistant Why: Hospital Follow Up Contact information: [address] [place] [address] [phone]         [facility]. Schedule an appointment as soon as possible for a visit in 1 month(s).   Why: Hospital Follow Up; OFFICE WILL CALL WITH APPT Contact information: [address] [place] [address] [phone]               No Known Allergies Allergies as of 02/19/2021   No Known Allergies      Medication List     STOP taking these medications    polyethylene glycol 17 g packet Commonly known as: MIRALAX / GLYCOLAX       TAKE these medications    docusate sodium 100 MG capsule Commonly known as: Colace Take 1 capsule (100 mg total) by mouth 2 (two) times daily.   HYDROcodone-acetaminophen 5-325 MG tablet Commonly known as: Norco Take 1 tablet by mouth every 6 (six) hours as needed for up to 3 days for severe pain. What changed:  when to take this reasons to take this   multivitamin with minerals Tabs tablet Take 1 tablet by mouth daily. Start taking on: February 20, 2021   ondansetron 4 MG tablet Commonly known as: ZOFRAN Take 1 tablet (4 mg total) by mouth every 8 (eight) hours as needed for nausea or vomiting.   pantoprazole 40 MG tablet Commonly known as: PROTONIX Take 1 tablet (40 mg total) by mouth daily. Tome una tableta por boca diaria        Procedures/Studies: DG Abd 1 View  Result Date: 02/16/2021 CLINICAL DATA:  Abdominal pain EXAM: ABDOMEN - 1 VIEW COMPARISON:  Previous studies including the examination of 02/15/2021 FINDINGS: There is abnormal  dilation of multiple small bowel loops measuring up to 4.9 cm in diameter. Stomach is not distended. Gas and stool are noted in the colon. IMPRESSION: There is abnormal dilation of multiple small bowel loops suggesting high-grade partial small bowel obstruction. Electronically Signed   By: Ernie Avena M.D.   On: 02/16/2021 11:37   US Abdomen Complete  Result Date: 02/02/2021 CLINICAL DATA:  Abdominal and flank pain EXAM: ABDOMEN ULTRASOUND COMPLETE COMPARISON:  None. FINDINGS: Gallbladder: Gallstones are present measuring up to 8 mm. Sludge is present. No wall thickening visualized. No sonographic Murphy sign noted by sonographer. Common bile duct: Diameter: 4 mm Liver: No focal lesion identified. Increased parenchymal echogenicity. Portal vein is patent on color Doppler imaging with normal direction of blood flow towards the liver. IVC: No abnormality visualized. Pancreas: Obscured. Spleen: Size and appearance within normal limits. Right Kidney: Length: 10.2 cm. Echogenicity within normal limits. No mass or hydronephrosis visualized. Left Kidney:  Length: 9.7 cm. Echogenicity within normal limits. No mass or hydronephrosis visualized. Abdominal aorta: No aneurysm visualized. Other findings: None. IMPRESSION: Gallbladder stones and sludge. No sonographic evidence of acute cholecystitis. Increased liver echogenicity likely reflecting steatosis. Electronically Signed   By: Guadlupe Spanish M.D.   On: 02/02/2021 10:51   CT Abdomen Pelvis W Contrast  Result Date: 02/15/2021 CLINICAL DATA:  Acute abdominal pain. Fever. Post cholecystectomy 1 week ago. EXAM: CT ABDOMEN AND PELVIS WITH CONTRAST TECHNIQUE: Multidetector CT imaging of the abdomen and pelvis was performed using the standard protocol following bolus administration of intravenous contrast. CONTRAST:  OMNIPAQUE IOHEXOL 300 MG/ML  SOLN COMPARISON:  Ultrasound abdomen 02/02/2021. FINDINGS: Lower chest: No acute abnormality. Hepatobiliary:  Status post cholecystectomy. There is a fluid collection in the gallbladder fossa measuring 3.5 x 1.8 x 2.1. There is a tiny focus of air near the gallbladder fossa, likely postoperative. There is no biliary ductal dilatation. There is a hypodense lesion in the left lobe of the liver measuring 1 cm which is too small to characterize. The liver otherwise appears within normal limits. Pancreas: Unremarkable. No pancreatic ductal dilatation or surrounding inflammatory changes. Spleen: Small in size. Adrenals/Urinary Tract: Adrenal glands are unremarkable. Kidneys are normal, without renal calculi, focal lesion, or hydronephrosis. Bladder is unremarkable. Stomach/Bowel: There is a small hernia in the right groin containing a loop of small bowel. This is transition point for small bowel obstruction. Small bowel loops proximal to this level are dilated and fluid-filled measuring up to 3.5 cm. Stomach is nondilated. Small bowel distal to this level is decompressed. The appendix is within normal limits. There is no free air, pneumatosis or mesenteric edema. Vascular/Lymphatic: Aortic atherosclerosis. No enlarged abdominal or pelvic lymph nodes. Reproductive: Uterus and bilateral adnexa are unremarkable. Other: There is no ascites. There is some stranding in the subcutaneous tissues of the anterior abdominal wall likely related to recent surgery. Musculoskeletal: No acute or significant osseous findings. IMPRESSION: 1. Small hernia in the right groin, possibly inguinal, containing small bowel loop. This is transition point for small bowel obstruction. Findings are concerning for strangulated/incarcerated hernia. 2. Status post cholecystectomy. There is a fluid collection in the gallbladder fossa, indeterminate. Electronically Signed   By: Darliss Cheney M.D.   On: 02/15/2021 21:05   DG Abdomen Acute W/Chest  Result Date: 02/06/2021 CLINICAL DATA:  Abdominal pain and vomiting. EXAM: DG ABDOMEN ACUTE WITH 1 VIEW CHEST  COMPARISON:  None. FINDINGS: There is some dilated small bowel loops in the left abdomen measuring up to 4 cm containing air-fluid levels. Air seen throughout nondilated colon to the level the rectum. Cholecystectomy clips are present. There is also air-fluid level in the stomach. There are no suspicious calcifications. There is no free air under the diaphragm. There are minimal atelectatic changes in the lung bases. The lungs are otherwise clear. There is no pleural effusion or pneumothorax. Cardiomediastinal silhouette is within normal limits. No acute fractures are seen. IMPRESSION: 1. Dilated small bowel with air-fluid levels with air-fluid level in the stomach suspicious for small bowel obstruction. 2. Bibasilar atelectasis. Electronically Signed   By: Darliss Cheney M.D.   On: 02/06/2021 18:39   DG Abd Portable 2V  Result Date: 02/17/2021 CLINICAL DATA:  Lower abdominal pain, vomiting. EXAM: PORTABLE ABDOMEN - 2 VIEW COMPARISON:  February 16, 2021. FINDINGS: Continued small bowel dilatation is noted concerning for distal small bowel obstruction. There is no evidence of free air. No radio-opaque calculi or other significant radiographic abnormality is  seen. IMPRESSION: Continued small bowel dilatation is noted concerning for distal small bowel obstruction. Electronically Signed   By: Lupita Raider M.D.   On: 02/17/2021 08:29   NM HEPATOBILIARY LEAK (POST-SURGICAL)  Result Date: 02/07/2021 CLINICAL DATA:  Upper abdominal pain status post cholecystectomy. EXAM: NUCLEAR MEDICINE HEPATOBILIARY IMAGING TECHNIQUE: Sequential images of the abdomen were obtained out to 60 minutes following intravenous administration of radiopharmaceutical. RADIOPHARMACEUTICALS:  5.5 mCi Tc-14m  Choletec IV COMPARISON:  February 02, 2021. FINDINGS: Prompt uptake and biliary excretion of activity by the liver is seen. No abnormal accumulation of contrast is noted to suggest bile leak. Biliary activity passes into small bowel,  consistent with patent common bile duct. IMPRESSION: No definite scintigraphic evidence of bile leak. Electronically Signed   By: Lupita Raider M.D.   On: 02/07/2021 13:16     Subjective: Pt reports that she is feeling better, tolerating diet, ambulating and no abdominal pain, no n/v/d. Having bowel movements.   Discharge Exam: Vitals:   02/18/21 2225 02/19/21 0527  BP: 93/69 106/63  Pulse: 90 97  Resp: 20 20  Temp: 98.7 F (37.1 C)   SpO2: 98% 97%   Vitals:   02/18/21 1328 02/18/21 2225 02/19/21 0500 02/19/21 0527  BP: 135/80 93/69  106/63  Pulse: 89 90  97  Resp:  20  20  Temp: 98.5 F (36.9 C) 98.7 F (37.1 C)    TempSrc: Oral Oral    SpO2: 100% 98%  97%  Weight:   60.5 kg   Height:       General: Pt is alert, awake, not in acute distress Cardiovascular: RRR, S1/S2 +, no rubs, no gallops Respiratory: CTA bilaterally, no wheezing, no rhonchi Abdominal: Soft, NT, ND, bowel sounds + Extremities: no edema, no cyanosis   The results of significant diagnostics from this hospitalization (including imaging, microbiology, ancillary and laboratory) are listed below for reference.     Microbiology: Recent Results (from the past 240 hour(s))  Resp Panel by RT-PCR (Flu A&B, Covid) Nasopharyngeal Swab     Status: None   Collection Time: 02/15/21 10:02 PM   Specimen: Nasopharyngeal Swab; Nasopharyngeal(NP) swabs in vial transport medium  Result Value Ref Range Status   SARS Coronavirus 2 by RT PCR NEGATIVE NEGATIVE Final    Comment: (NOTE) SARS-CoV-2 target nucleic acids are NOT DETECTED.  The SARS-CoV-2 RNA is generally detectable in upper respiratory specimens during the acute phase of infection. The lowest concentration of SARS-CoV-2 viral copies this assay can detect is 138 copies/mL. A negative result does not preclude SARS-Cov-2 infection and should not be used as the sole basis for treatment or other patient management decisions. A negative result may occur with   improper specimen collection/handling, submission of specimen other than nasopharyngeal swab, presence of viral mutation(s) within the areas targeted by this assay, and inadequate number of viral copies(<138 copies/mL). A negative result must be combined with clinical observations, patient history, and epidemiological information. The expected result is Negative.  Fact Sheet for Patients:  BloggerCourse.com  Fact Sheet for Healthcare Providers:  SeriousBroker.it  This test is no t yet approved or cleared by the [place] FDA and  has been authorized for detection and/or diagnosis of SARS-CoV-2 by FDA under an Emergency Use Authorization (EUA). This EUA will remain  in effect (meaning this test can be used) for the duration of the COVID-19 declaration under Section 564(b)(1) of the Act, 21 U.S.C.section 360bbb-3(b)(1), unless the authorization is terminated  or revoked sooner.  Influenza A by PCR NEGATIVE NEGATIVE Final   Influenza B by PCR NEGATIVE NEGATIVE Final    Comment: (NOTE) The Xpert Xpress SARS-CoV-2/FLU/RSV plus assay is intended as an aid in the diagnosis of influenza from Nasopharyngeal swab specimens and should not be used as a sole basis for treatment. Nasal washings and aspirates are unacceptable for Xpert Xpress SARS-CoV-2/FLU/RSV testing.  Fact Sheet for Patients: BloggerCourse.com  Fact Sheet for Healthcare Providers: SeriousBroker.it  This test is not yet approved or cleared by the [place] FDA and has been authorized for detection and/or diagnosis of SARS-CoV-2 by FDA under an Emergency Use Authorization (EUA). This EUA will remain in effect (meaning this test can be used) for the duration of the COVID-19 declaration under Section 564(b)(1) of the Act, 21 U.S.C. section 360bbb-3(b)(1), unless the authorization is terminated  or revoked.  Performed at [facility], [address] [place], [place] [address]      Labs: BNP (last 3 results) No results for input(s): BNP in the last 8760 hours. Basic Metabolic Panel: Recent Labs  Lab 02/15/21 1744 02/16/21 0825 02/17/21 0409  NA 132* 133* 135  K 4.1 3.7 3.7  CL 94* 100 103  CO2 GLUCOSE 123* 99 92  BUN 40* 33* 26*  CREATININE 0.82 0.71 0.65  CALCIUM 9.1 8.5* 8.6*  MG  --  2.3  --    Liver Function Tests: Recent Labs  Lab 02/15/21 1744 02/16/21 0825  AST 36 39  ALT 41 41  ALKPHOS 99 92  BILITOT 1.4* 0.9  PROT 8.7* 7.3  ALBUMIN 4.0 3.4*   Recent Labs  Lab 02/15/21 1744  LIPASE 125*   No results for input(s): AMMONIA in the last 168 hours. CBC: Recent Labs  Lab 02/15/21 1744 02/16/21 0825  WBC 12.8* 9.1  NEUTROABS  --  6.0  HGB 15.8* 13.9  HCT 45.7 41.0  MCV 86.2 88.0  PLT 342 291   Cardiac Enzymes: No results for input(s): CKTOTAL, CKMB, CKMBINDEX, TROPONINI in the last 168 hours. BNP: Invalid input(s): POCBNP CBG: No results for input(s): GLUCAP in the last 168 hours. D-Dimer No results for input(s): DDIMER in the last 72 hours. Hgb A1c No results for input(s): HGBA1C in the last 72 hours. Lipid Profile No results for input(s): CHOL, HDL, LDLCALC, TRIG, CHOLHDL, LDLDIRECT in the last 72 hours. Thyroid function studies No results for input(s): TSH, T4TOTAL, T3FREE, THYROIDAB in the last 72 hours.  Invalid input(s): FREET3 Anemia work up No results for input(s): VITAMINB12, FOLATE, FERRITIN, TIBC, IRON, RETICCTPCT in the last 72 hours. Urinalysis    Component Value Date/Time   COLORURINE YELLOW 02/15/2021 0055   APPEARANCEUR CLEAR 02/15/2021 0055   LABSPEC 1.010 02/15/2021 0055   PHURINE 5.5 02/15/2021 0055   GLUCOSEU NEGATIVE 02/15/2021 0055   HGBUR NEGATIVE 02/15/2021 0055   BILIRUBINUR NEGATIVE 02/15/2021 0055   BILIRUBINUR NEG 08/27/2016 1319   KETONESUR 15 (A) 02/15/2021 0055   PROTEINUR NEGATIVE  02/15/2021 0055   UROBILINOGEN 0.2 08/27/2016 1319   NITRITE NEGATIVE 02/15/2021 0055   LEUKOCYTESUR NEGATIVE 02/15/2021 0055   Sepsis Labs Invalid input(s): PROCALCITONIN,  WBC,  LACTICIDVEN Microbiology Recent Results (from the past 240 hour(s))  Resp Panel by RT-PCR (Flu A&B, Covid) Nasopharyngeal Swab     Status: None   Collection Time: 02/15/21 10:02 PM   Specimen: Nasopharyngeal Swab; Nasopharyngeal(NP) swabs in vial transport medium  Result Value Ref Range Status   SARS Coronavirus 2 by RT PCR NEGATIVE NEGATIVE  Final    Comment: (NOTE) SARS-CoV-2 target nucleic acids are NOT DETECTED.  The SARS-CoV-2 RNA is generally detectable in upper respiratory specimens during the acute phase of infection. The lowest concentration of SARS-CoV-2 viral copies this assay can detect is 138 copies/mL. A negative result does not preclude SARS-Cov-2 infection and should not be used as the sole basis for treatment or other patient management decisions. A negative result may occur with  improper specimen collection/handling, submission of specimen other than nasopharyngeal swab, presence of viral mutation(s) within the areas targeted by this assay, and inadequate number of viral copies(<138 copies/mL). A negative result must be combined with clinical observations, patient history, and epidemiological information. The expected result is Negative.  Fact Sheet for Patients:  BloggerCourse.com  Fact Sheet for Healthcare Providers:  SeriousBroker.it  This test is no t yet approved or cleared by the [place] FDA and  has been authorized for detection and/or diagnosis of SARS-CoV-2 by FDA under an Emergency Use Authorization (EUA). This EUA will remain  in effect (meaning this test can be used) for the duration of the COVID-19 declaration under Section 564(b)(1) of the Act, 21 U.S.C.section 360bbb-3(b)(1), unless the authorization is  terminated  or revoked sooner.       Influenza A by PCR NEGATIVE NEGATIVE Final   Influenza B by PCR NEGATIVE NEGATIVE Final    Comment: (NOTE) The Xpert Xpress SARS-CoV-2/FLU/RSV plus assay is intended as an aid in the diagnosis of influenza from Nasopharyngeal swab specimens and should not be used as a sole basis for treatment. Nasal washings and aspirates are unacceptable for Xpert Xpress SARS-CoV-2/FLU/RSV testing.  Fact Sheet for Patients: BloggerCourse.com  Fact Sheet for Healthcare Providers: SeriousBroker.it  This test is not yet approved or cleared by the [place] FDA and has been authorized for detection and/or diagnosis of SARS-CoV-2 by FDA under an Emergency Use Authorization (EUA). This EUA will remain in effect (meaning this test can be used) for the duration of the COVID-19 declaration under Section 564(b)(1) of the Act, 21 U.S.C. section 360bbb-3(b)(1), unless the authorization is terminated or revoked.  Performed at [facility], [address] [place], [place] [address]    Time coordinating discharge: 34 mins   SIGNED:  Standley Dakins, MD  [facility] 02/19/2021, 12:00 PM How to contact the [facility] Attending or Consulting provider 7A - 7P or covering provider during after hours 7P -7A, for this patient?  Check the care team in [facility] and look for a) attending/consulting [facility] provider listed and b) the [facility] team listed Log into [email or web address] and use [facility]'s universal password to access. If you do not have the password, please contact the hospital operator. Locate the [facility] provider you are looking for under [facility] and page to a number that you can be directly reached. If you still have difficulty reaching the provider, please page the [facility] (Director on Call) for the Hospitalists listed on amion for assistance.

## 2021-02-19 NOTE — Progress Notes (Signed)
[facility]  Patient having Bms and tolerating diet of soft food. Wants to go home.  BP 106/63 (BP Location: Left Arm)   Pulse 97   Temp 98.7 F (37.1 C) (Oral)   Resp 20   Ht 4\' 10"  (1.473 m)   Wt 60.5 kg   LMP 03/11/2008 (Approximate)   SpO2 97%   BMI 27.88 kg/m  Soft, nondistended, nontender, hernia reduced, port sites healing from cholecystectomy  Patient  resolving SBO from R inguinal hernia. Doing well.  Home with soft diet Colace for Bms  Zofran as needed for nausea  Discussed risk of incarceration and reasons to go to the ED. Discussed with patient and her son via interpretor.   Will follow up in 2 weeks to discuss hernia surgery. Office will make the appt with Dr. 05/09/2008 or myself.    Lovell Sheehan, MD [facility] [address] [place], [place] [phone] (office)

## 2021-02-27 ENCOUNTER — Other Ambulatory Visit: Payer: Self-pay

## 2021-02-27 ENCOUNTER — Encounter (HOSPITAL_COMMUNITY): Payer: Self-pay

## 2021-02-27 ENCOUNTER — Ambulatory Visit: Payer: Self-pay | Admitting: Physician Assistant

## 2021-02-27 ENCOUNTER — Inpatient Hospital Stay (HOSPITAL_COMMUNITY)
Admission: EM | Admit: 2021-02-27 | Discharge: 2021-03-05 | DRG: 330 | Disposition: A | Discharge status: Home / Self Care | Payer: Self-pay | Source: Ambulatory Visit | Attending: General Surgery | Admitting: General Surgery

## 2021-02-27 ENCOUNTER — Emergency Department (HOSPITAL_COMMUNITY): Payer: Self-pay

## 2021-02-27 DIAGNOSIS — Z789 Other specified health status: Secondary | ICD-10-CM

## 2021-02-27 DIAGNOSIS — Z20822 Contact with and (suspected) exposure to covid-19: Secondary | ICD-10-CM | POA: Diagnosis present

## 2021-02-27 DIAGNOSIS — R112 Nausea with vomiting, unspecified: Secondary | ICD-10-CM

## 2021-02-27 DIAGNOSIS — K9189 Other postprocedural complications and disorders of digestive system: Principal | ICD-10-CM | POA: Diagnosis present

## 2021-02-27 DIAGNOSIS — Y838 Other surgical procedures as the cause of abnormal reaction of the patient, or of later complication, without mention of misadventure at the time of the procedure: Secondary | ICD-10-CM | POA: Diagnosis present

## 2021-02-27 DIAGNOSIS — Z833 Family history of diabetes mellitus: Secondary | ICD-10-CM

## 2021-02-27 DIAGNOSIS — Z809 Family history of malignant neoplasm, unspecified: Secondary | ICD-10-CM

## 2021-02-27 DIAGNOSIS — E860 Dehydration: Secondary | ICD-10-CM | POA: Diagnosis present

## 2021-02-27 DIAGNOSIS — K219 Gastro-esophageal reflux disease without esophagitis: Secondary | ICD-10-CM | POA: Diagnosis present

## 2021-02-27 DIAGNOSIS — K4030 Unilateral inguinal hernia, with obstruction, without gangrene, not specified as recurrent: Secondary | ICD-10-CM | POA: Diagnosis present

## 2021-02-27 DIAGNOSIS — K567 Ileus, unspecified: Secondary | ICD-10-CM | POA: Diagnosis present

## 2021-02-27 LAB — CBC WITH DIFFERENTIAL/PLATELET
Abs Immature Granulocytes: 0.02 10*3/uL (ref 0.00–0.07)
Basophils Absolute: 0 10*3/uL (ref 0.0–0.1)
Basophils Relative: 1 %
Eosinophils Absolute: 0 10*3/uL (ref 0.0–0.5)
Eosinophils Relative: 1 %
HCT: 44.8 % (ref 36.0–46.0)
Hemoglobin: 15.4 g/dL — ABNORMAL HIGH (ref 12.0–15.0)
Immature Granulocytes: 0 %
Lymphocytes Relative: 19 %
Lymphs Abs: 1.2 10*3/uL (ref 0.7–4.0)
MCH: 29.8 pg (ref 26.0–34.0)
MCHC: 34.4 g/dL (ref 30.0–36.0)
MCV: 86.7 fL (ref 80.0–100.0)
Monocytes Absolute: 0.4 10*3/uL (ref 0.1–1.0)
Monocytes Relative: 7 %
Neutro Abs: 4.6 10*3/uL (ref 1.7–7.7)
Neutrophils Relative %: 72 %
Platelets: 230 10*3/uL (ref 150–400)
RBC: 5.17 MIL/uL — ABNORMAL HIGH (ref 3.87–5.11)
RDW: 13.3 % (ref 11.5–15.5)
WBC: 6.3 10*3/uL (ref 4.0–10.5)
nRBC: 0 % (ref 0.0–0.2)

## 2021-02-27 LAB — COMPREHENSIVE METABOLIC PANEL
ALT: 66 U/L — ABNORMAL HIGH (ref 0–44)
AST: 83 U/L — ABNORMAL HIGH (ref 15–41)
Albumin: 3.9 g/dL (ref 3.5–5.0)
Alkaline Phosphatase: 89 U/L (ref 38–126)
Anion gap: 18 — ABNORMAL HIGH (ref 5–15)
BUN: 24 mg/dL — ABNORMAL HIGH (ref 8–23)
CO2: 18 mmol/L — ABNORMAL LOW (ref 22–32)
Calcium: 9.2 mg/dL (ref 8.9–10.3)
Chloride: 98 mmol/L (ref 98–111)
Creatinine, Ser: 0.74 mg/dL (ref 0.44–1.00)
GFR, Estimated: >60 mL/min (ref >60)
Glucose, Bld: 128 mg/dL — ABNORMAL HIGH (ref 70–99)
Potassium: 3.5 mmol/L (ref 3.5–5.1)
Sodium: 134 mmol/L — ABNORMAL LOW (ref 135–145)
Total Bilirubin: 1.1 mg/dL (ref 0.3–1.2)
Total Protein: 8.6 g/dL — ABNORMAL HIGH (ref 6.5–8.1)

## 2021-02-27 LAB — LACTIC ACID, PLASMA: Lactic Acid, Venous: 1.5 mmol/L (ref 0.5–1.9)

## 2021-02-27 LAB — RESP PANEL BY RT-PCR (FLU A&B, COVID) ARPGX2
Influenza A by PCR: NEGATIVE
Influenza B by PCR: NEGATIVE
SARS Coronavirus 2 by RT PCR: NEGATIVE

## 2021-02-27 LAB — MAGNESIUM: Magnesium: 1.5 mg/dL — ABNORMAL LOW (ref 1.7–2.4)

## 2021-02-27 LAB — LIPASE, BLOOD: Lipase: 57 U/L — ABNORMAL HIGH (ref 11–51)

## 2021-02-27 MED ORDER — OXYCODONE HCL 5 MG PO TABS: 5.0000 mg | ORAL_TABLET | Freq: Four times a day (QID) | ORAL | Status: DC | PRN

## 2021-02-27 MED ORDER — HEPARIN SODIUM (PORCINE) 5000 UNIT/ML IJ SOLN
5000.0000 [IU] | Freq: Three times a day (TID) | INTRAMUSCULAR | Status: DC
  Administered 2021-02-27 – 2021-03-01 (×6): 5000 [IU] via SUBCUTANEOUS
  Filled 2021-02-27 (×6): qty 1

## 2021-02-27 MED ORDER — ACETAMINOPHEN 500 MG PO TABS
1000.0000 mg | ORAL_TABLET | Freq: Four times a day (QID) | ORAL | Status: DC
  Administered 2021-02-27 – 2021-03-05 (×8): 1000 mg via ORAL
  Filled 2021-02-27 (×9): qty 2

## 2021-02-27 MED ORDER — SODIUM CHLORIDE 0.9 % IV SOLN: INTRAVENOUS | Status: DC

## 2021-02-27 MED ORDER — ONDANSETRON 4 MG PO TBDP: 4.0000 mg | ORAL_TABLET | Freq: Four times a day (QID) | ORAL | Status: DC | PRN

## 2021-02-27 MED ORDER — MAGNESIUM OXIDE -MG SUPPLEMENT 400 (240 MG) MG PO TABS
800.0000 mg | ORAL_TABLET | Freq: Two times a day (BID) | ORAL | Status: AC
  Administered 2021-02-27 (×2): 800 mg via ORAL
  Filled 2021-02-27 (×2): qty 2

## 2021-02-27 MED ORDER — PANTOPRAZOLE SODIUM 40 MG PO TBEC
40.0000 mg | DELAYED_RELEASE_TABLET | Freq: Every day | ORAL | Status: DC
  Administered 2021-02-27 – 2021-03-05 (×6): 40 mg via ORAL
  Filled 2021-02-27 (×7): qty 1

## 2021-02-27 MED ORDER — LACTATED RINGERS IV BOLUS
1000.0000 mL | Freq: Once | INTRAVENOUS | Status: AC
  Administered 2021-02-27: 13:00:00 1000 mL via INTRAVENOUS

## 2021-02-27 MED ORDER — DOCUSATE SODIUM 100 MG PO CAPS
100.0000 mg | ORAL_CAPSULE | Freq: Two times a day (BID) | ORAL | Status: DC
  Administered 2021-02-27 – 2021-03-05 (×9): 100 mg via ORAL
  Filled 2021-02-27 (×11): qty 1

## 2021-02-27 MED ORDER — ACETAMINOPHEN 500 MG PO TABS: 1000.0000 mg | ORAL_TABLET | Freq: Four times a day (QID) | ORAL | Status: DC

## 2021-02-27 MED ORDER — SODIUM CHLORIDE 0.9 % IV BOLUS
1000.0000 mL | Freq: Once | INTRAVENOUS | Status: AC
  Administered 2021-02-27: 11:00:00 1000 mL via INTRAVENOUS

## 2021-02-27 MED ORDER — MORPHINE SULFATE (PF) 2 MG/ML IV SOLN
1.0000 mg | INTRAVENOUS | Status: DC | PRN
  Administered 2021-03-02 – 2021-03-03 (×5): 1 mg via INTRAVENOUS
  Filled 2021-02-27 (×5): qty 1

## 2021-02-27 MED ORDER — ADULT MULTIVITAMIN W/MINERALS CH
1.0000 | ORAL_TABLET | Freq: Every day | ORAL | Status: DC
  Administered 2021-02-27 – 2021-03-05 (×6): 1 via ORAL
  Filled 2021-02-27 (×7): qty 1

## 2021-02-27 MED ORDER — ONDANSETRON HCL 4 MG/2ML IJ SOLN
4.0000 mg | Freq: Four times a day (QID) | INTRAMUSCULAR | Status: DC | PRN
  Administered 2021-02-28 – 2021-03-02 (×2): 4 mg via INTRAVENOUS
  Filled 2021-02-27: qty 2

## 2021-02-27 MED ORDER — KETOROLAC TROMETHAMINE 30 MG/ML IJ SOLN: 30.0000 mg | Freq: Four times a day (QID) | INTRAMUSCULAR | Status: AC | PRN

## 2021-02-27 NOTE — ED Provider Notes (Signed)
[facility] EMERGENCY DEPARTMENT Provider Note   CSN: [id number] Arrival date & time: 02/27/21  5053     History Chief Complaint  Patient presents with   Nausea    Janice Wells is a 60 y.o. female.  HPI    60 year old female comes in with chief complaint of nausea. She is status post cholecystectomy and appendectomy recently.  Patient was also diagnosed with inguinal hernia subsequently and admitted to the hospital for SBO.  The obstruction resolved after the hernia reduced, possibly by itself.  Patient reports that ever since the discharge, she continues to have nausea and poor appetite.  She went to see her PCP and was advised to come to the ER because she appeared dehydrated.  Patient denies any new abdominal pain.  She continues to have some abdominal discomfort in the periumbilical region.  No UTI-like symptoms, URI-like symptoms, fevers, chills.  Patient does feel slightly weak  Past Medical History:  Diagnosis Date   Acute cholecystitis 02/02/2021   GERD without esophagitis 11/27/2020   Obesity, unspecified 02/15/2015    Patient Active Problem List   Diagnosis Date Noted   Oropharyngeal dysphagia    Constipation 02/16/2021   Non-English speaking patient 02/16/2021   Right inguinal hernia    Generalized abdominal pain 02/07/2021   Hypokalemia 02/07/2021   Intractable nausea and vomiting 02/07/2021   SBO (small bowel obstruction) (HCC) 02/06/2021   GERD without esophagitis 11/27/2020   Obesity, unspecified 02/15/2015    Past Surgical History:  Procedure Laterality Date   CHOLECYSTECTOMY N/A 02/03/2021   Procedure: LAPAROSCOPIC CHOLECYSTECTOMY;  Surgeon: Franky Macho, MD;  Location: [facility] ORS;  Service: General;  Laterality: N/A;     OB History     Gravida  0   Para  0   Term  0   Preterm  0   AB  0   Living         SAB  0   IAB  0   Ectopic  0   Multiple      Live Births              Family History  Problem Relation Age  of Onset   Diabetes Sister    Cancer Brother     Social History   Tobacco Use   Smoking status: Never   Smokeless tobacco: Never  Substance Use Topics   Alcohol use: No   Drug use: No    Home Medications Prior to Admission medications   Medication Sig Start Date End Date Taking? Authorizing Provider  docusate sodium (COLACE) 100 MG capsule Take 1 capsule (100 mg total) by mouth 2 (two) times daily. 02/19/21 02/19/22 Yes Johnson, Clanford L, MD  Multiple Vitamin (MULTIVITAMIN WITH MINERALS) TABS tablet Take 1 tablet by mouth daily. 02/20/21  Yes Johnson, Clanford L, MD  ondansetron (ZOFRAN) 4 MG tablet Take 1 tablet (4 mg total) by mouth every 8 (eight) hours as needed for nausea or vomiting. 02/19/21  Yes Johnson, Clanford L, MD  pantoprazole (PROTONIX) 40 MG tablet Take 1 tablet (40 mg total) by mouth daily. Tome una tableta por boca diaria 02/09/21  Yes Franky Macho, MD    Allergies    Patient has no known allergies.  Review of Systems   Review of Systems  Constitutional:  Positive for activity change and fatigue.  Gastrointestinal:  Positive for nausea.  Allergic/Immunologic: Negative for immunocompromised state.  Neurological:  Positive for dizziness.  Hematological:  Does not bruise/bleed easily.  All other  systems reviewed and are negative.  Physical Exam Updated Vital Signs BP 127/90    Pulse 94    Temp (!) 97.5 F (36.4 C) (Oral)    Resp 16    Ht 4\' 10"  (1.473 m)    Wt 61.2 kg    LMP 03/11/2008 (Approximate)    SpO2 98%    BMI 28.22 kg/m   Physical Exam Vitals and nursing note reviewed.  Constitutional:      Appearance: She is well-developed.  HENT:     Head: Atraumatic.     Mouth/Throat:     Mouth: Mucous membranes are dry.  Cardiovascular:     Rate and Rhythm: Normal rate.  Pulmonary:     Effort: Pulmonary effort is normal.  Abdominal:     General: There is no distension.     Palpations: Abdomen is soft.     Tenderness: There is abdominal tenderness.  There is no guarding or rebound.     Comments: Mild tenderness periumbilically  Musculoskeletal:     Cervical back: Normal range of motion and neck supple.  Skin:    General: Skin is warm and dry.  Neurological:     Mental Status: She is alert and oriented to person, place, and time.    ED Results / Procedures / Treatments   Labs (all labs ordered are listed, but only abnormal results are displayed) Labs Reviewed  COMPREHENSIVE METABOLIC PANEL - Abnormal; Notable for the following components:      Result Value   Sodium 134 (*)    CO2 18 (*)    Glucose, Bld 128 (*)    BUN 24 (*)    Total Protein 8.6 (*)    AST 83 (*)    ALT 66 (*)    Anion gap 18 (*)    All other components within normal limits  CBC WITH DIFFERENTIAL/PLATELET - Abnormal; Notable for the following components:   RBC 5.17 (*)    Hemoglobin 15.4 (*)    All other components within normal limits  MAGNESIUM - Abnormal; Notable for the following components:   Magnesium 1.5 (*)    All other components within normal limits  LIPASE, BLOOD - Abnormal; Notable for the following components:   Lipase 57 (*)    All other components within normal limits  RESP PANEL BY RT-PCR (FLU A&B, COVID) ARPGX2  LACTIC ACID, PLASMA    EKG None  Radiology DG ABD ACUTE 2+V W 1V CHEST  Result Date: 02/27/2021 CLINICAL DATA:  Nausea, vomiting EXAM: DG ABDOMEN ACUTE WITH 1 VIEW CHEST COMPARISON:  02/17/2021 FINDINGS: Continued small bowel dilatation concerning for distal small bowel obstruction. Little colonic gas seen. Prior cholecystectomy. No free air organomegaly. Heart and mediastinal contours are within normal limits. No focal opacities or effusions. No acute bony abnormality. IMPRESSION: Continued small bowel dilatation concerning for small bowel obstruction. No acute cardiopulmonary disease. Electronically Signed   By: 14/12/2020 M.D.   On: 02/27/2021 11:21    Procedures Procedures   Medications Ordered in ED Medications   sodium chloride 0.9 % bolus 1,000 mL (0 mLs Intravenous Stopped 02/27/21 1238)  lactated ringers bolus 1,000 mL (1,000 mLs Intravenous New Bag/Given 02/27/21 1246)    ED Course  I have reviewed the triage vital signs and the nursing notes.  Pertinent labs & imaging results that were available during my care of the patient were reviewed by me and considered in my medical decision making (see chart for details).    MDM Rules/Calculators/A&P  60 year old female comes in with chief complaint of nausea, poor appetite.  She is status post cholecystectomy and appendectomy last month.  Thereafter she was admitted for what appeared to be clinically small bowel obstruction with transition point being inguinal hernia.  Her hernia was reduced externally and the obstruction clinically got better.  Patient indicates that since her discharge, she has not really felt well, and more recently she has had persistent nausea with poor appetite.  No abdominal pain however. Patient appears dry.  On exam there is no peritoneal findings.  I do not appreciate any hernia.  Acute abdominal series does indicate distended loops.  Not sure if CT scan is going to add significant value.  We will consult general surgery to see if they would want further imaging or to manage her as an ileus versus SBO.  Patient indicates that she is having BM and passing flatus which is reassuring.  Although patient is tachycardic, initial thought is that this is secondary to dehydration.  PE considered in the differential given the recent admission along with hyperthyroidism -but we will defer evaluation into those conditions if she does not improve with hydration.     Final Clinical Impression(s) / ED Diagnoses Final diagnoses:  Ileus [facility])    Rx / DC Orders ED Discharge Orders     None        Derwood Kaplan, MD 02/28/21 [phone]

## 2021-02-27 NOTE — ED Notes (Signed)
Surgeon at bedside.  

## 2021-02-27 NOTE — ED Notes (Signed)
Pt had gall bladder removed nov 26. States has been unable to eat with intermittent n/v/d since. Color wnl. Gen weakness noted. Mm moist.

## 2021-02-27 NOTE — ED Triage Notes (Signed)
Patient unable to eat or drink after having gall bladder removed on Nov 26 becomes nauseous and vomits after PO intake.  Advised by physician to come to Er.

## 2021-02-27 NOTE — H&P (Signed)
[facility] SURGICAL HISTORY & PHYSICAL   HISTORY OF PRESENT ILLNESS (HPI):  60 y.o. female presented to [facility] ED for nausea, vomiting, and abdominal pain.  Patient has a history of laparoscopic cholecystectomy on November 26 with Dr. Arnoldo Morale.  She subsequently required admission to the hospital (11/30) for abdominal pain, at which time a bile leak was ruled out, but she was noted to have mild pancreatitis.  She was also again readmitted on 12/9 for concern for postop ileus versus small bowel obstruction.  On CT abdomen pelvis at that time, she was noted to have an incarcerated right inguinal hernia containing a loop of bowel.  The hernia self reduced, and she subsequently progressed and was discharged home.  She currently complains of nausea with an episode of emesis earlier today.  Last bowel movement was yesterday, and it was loose.  She confirms gas prior to her bowel movements.  She states that she has a decreased appetite as well.  Denies fever, chills, chest pain, and shortness of breath.  Patient's history was obtained with the assistance of the interpreter service.  PAST MEDICAL HISTORY (PMH):  Past Medical History:  Diagnosis Date   Acute cholecystitis 02/02/2021   GERD without esophagitis 11/27/2020   Obesity, unspecified 02/15/2015    Reviewed. Otherwise negative.   PAST SURGICAL HISTORY ([place]):  Past Surgical History:  Procedure Laterality Date   CHOLECYSTECTOMY N/A 02/03/2021   Procedure: LAPAROSCOPIC CHOLECYSTECTOMY;  Surgeon: Aviva Signs, MD;  Location: [facility];  Service: General;  Laterality: N/A;    Reviewed. Otherwise negative.   MEDICATIONS:  Prior to Admission medications   Medication Sig Start Date End Date Taking? Authorizing Provider  docusate sodium (COLACE) 100 MG capsule Take 1 capsule (100 mg total) by mouth 2 (two) times daily. 02/19/21 02/19/22 Yes Johnson, Clanford L, MD  Multiple Vitamin (MULTIVITAMIN WITH MINERALS) TABS tablet Take 1 tablet  by mouth daily. 02/20/21  Yes Johnson, Clanford L, MD  ondansetron (ZOFRAN) 4 MG tablet Take 1 tablet (4 mg total) by mouth every 8 (eight) hours as needed for nausea or vomiting. 02/19/21  Yes Johnson, Clanford L, MD  pantoprazole (PROTONIX) 40 MG tablet Take 1 tablet (40 mg total) by mouth daily. Tome una tableta por boca diaria 02/09/21  Yes Aviva Signs, MD     ALLERGIES:  No Known Allergies   SOCIAL HISTORY:  Social History   Socioeconomic History   Marital status: Single    Spouse name: Not on file   Number of children: Not on file   Years of education: Not on file   Highest education level: Not on file  Occupational History   Not on file  Tobacco Use   Smoking status: Never   Smokeless tobacco: Never  Substance and Sexual Activity   Alcohol use: No   Drug use: No   Sexual activity: Not on file  Other Topics Concern   Not on file  Social History Narrative   ** Merged History Encounter **       Social Determinants of Health   Financial Resource Strain: Not on file  Food Insecurity: Not on file  Transportation Needs: Not on file  Physical Activity: Not on file  Stress: Not on file  Social Connections: Not on file  Intimate Partner Violence: Not on file     FAMILY HISTORY:  Family History  Problem Relation Age of Onset   Diabetes Sister    Cancer Brother     Otherwise negative.   REVIEW  OF SYSTEMS:  Review of Systems  Constitutional:  Negative for chills and fever.  Respiratory:  Negative for shortness of breath and wheezing.   Cardiovascular:  Negative for chest pain and palpitations.  Gastrointestinal:  Positive for abdominal pain, nausea and vomiting.  Genitourinary:  Negative for frequency and urgency.  Musculoskeletal:  Negative for back pain and neck pain.  Skin:  Negative for itching and rash.  Psychiatric/Behavioral:  Negative for depression.    VITAL SIGNS:  Temp:  [97.5 F (36.4 C)] 97.5 F (36.4 C) (12/20 1009) Pulse Rate:  [94-121] 94  (12/20 1314) Resp:  [16-19] 16 (12/20 1314) BP: (127)/(90) 127/90 (12/20 1314) SpO2:  [94 %-98 %] 98 % (12/20 1314) Weight:  [61.2 kg] 61.2 kg (12/20 1017)     Height: 4\' 10"  (147.3 cm) Weight: 61.2 kg BMI (Calculated): 28.22   PHYSICAL EXAM:  Physical Exam Constitutional:      Appearance: Normal appearance.  HENT:     Head: Normocephalic and atraumatic.  Eyes:     Extraocular Movements: Extraocular movements intact.     Pupils: Pupils are equal, round, and reactive to light.  Cardiovascular:     Rate and Rhythm: Normal rate and regular rhythm.  Pulmonary:     Effort: Pulmonary effort is normal.     Breath sounds: Normal breath sounds.  Abdominal:     Comments: Abdomen soft, nondistended, nontender to percussion and palpation; no rigidity, guarding, or rebound tenderness.  No tenderness in bilateral groins, no hernias present on exam; laparoscopic incisions C/D/I with skin glue in place, healing well  Musculoskeletal:        General: Normal range of motion.  Skin:    General: Skin is warm and dry.  Neurological:     General: No focal deficit present.     Mental Status: She is alert and oriented to person, place, and time.  Psychiatric:        Mood and Affect: Mood normal.        Behavior: Behavior normal.    INTAKE/OUTPUT:  This shift: Total I/O In: 1000 [IV Piggyback:1000] Out: -   Last 2 shifts: @IOLAST2SHIFTS @  Labs:  CBC Latest Ref Rng & Units 02/27/2021 02/16/2021 02/15/2021  WBC 4.0 - 10.5 K/uL 6.3 9.1 12.8(H)  Hemoglobin 12.0 - 15.0 g/dL 15.4(H) 13.9 15.8(H)  Hematocrit 36.0 - 46.0 % 44.8 41.0 45.7  Platelets 150 - 400 K/uL 230 291 342   CMP Latest Ref Rng & Units 02/27/2021 02/17/2021 02/16/2021  Glucose 70 - 99 mg/dL 128(H) 92 99  BUN 8 - 23 mg/dL 24(H) 26(H) 33(H)  Creatinine 0.44 - 1.00 mg/dL 0.74 0.65 0.71  Sodium 135 - 145 mmol/L 134(L) 135 133(L)  Potassium 3.5 - 5.1 mmol/L 3.5 3.7 3.7  Chloride 98 - 111 mmol/L 98 103 100  CO2 22 - 32 mmol/L 18(L) 22  24  Calcium 8.9 - 10.3 mg/dL 9.2 8.6(L) 8.5(L)  Total Protein 6.5 - 8.1 g/dL 8.6(H) - 7.3  Total Bilirubin 0.3 - 1.2 mg/dL 1.1 - 0.9  Alkaline Phos 38 - 126 U/L 89 - 92  AST 15 - 41 U/L 83(H) - 39  ALT 0 - 44 U/L 66(H) - 41    Imaging studies:  DG ABDOMEN ACUTE WITH 1 VIEW CHEST   IMPRESSION: Continued small bowel dilatation concerning for small bowel obstruction.   No acute cardiopulmonary disease.  Assessment/Plan:  60 y.o. female who is status post laparoscopic cholecystectomy on 11/26, requiring subsequent readmissions for episode of pancreatitis  and postoperative ileus/partial small bowel obstruction. WBC 6.3, LA 1.5, Lipase 57   -Patient currently has a benign abdominal exam and no hernias noted on examination.  It is not fully clear whether her current symptoms are related to postoperative issues versus a possible intermittent incarceration of the right inguinal hernia leading to the symptoms.  Her KUB does demonstrate small bowel dilation, similar to the dilation she had during her previous admission.  -Will hold off on CT abdomen and pelvis at this time, as I do not believe it will add anything to the patient's care at this time  -We will admit the patient for observation to the general surgery service  -NPO -Normal saline at 125 cc/hr -PRN antiemetics and pain medication  -Repeat blood work in the AM  -DVT prophylaxis  -Will continue to monitor patient's progression  -It is possible that the patient may require repair of her right inguinal hernia, but no plans for surgery at this time.  All of the above findings and recommendations were discussed with the patient, and all of her questions were answered to her expressed satisfaction.  -- Theophilus Kinds, DO [facility] [address] [place], [place] [address] [phone] (office)

## 2021-02-27 NOTE — Progress Notes (Signed)
° °  LMP 03/11/2008 (Approximate)    Subjective:    Patient ID: Janice Wells, female    DOB: May 21, 1960, 60 y.o.   MRN: [id number]  HPI: Janice Wells is a 60 y.o. female presenting on 02/27/2021 for No chief complaint on file.   HPI   This is a telemedicine appointment through [employer]  I connected with  [name] on 02/27/21 by a video enabled telemedicine application and verified that I am speaking with the correct person using two identifiers.   I discussed the limitations of evaluation and management by telemedicine. The patient expressed understanding and agreed to proceed.  Pt is at home.  Provider and translator are at office.        Pt's son is with her at home for her appointment today.  He assists with history.  Pt says she has been Sick since she was in the hospital.   She was discharged on 12/12.   She had SBO.    Pt had to interrupt appointment to vomit  She isn't eating.  She is having nausea and vomiting and heartburn  She says she has No fever.  She had a small amount of diarrhea yesterday but it was just a little bit.  There was No blood in diarrhea.  It was yellow.      Relevant past medical, surgical, family and social history reviewed and updated as indicated. Interim medical history since our last visit reviewed. Allergies and medications reviewed and updated.  Review of Systems  Per HPI unless specifically indicated above     Objective:    LMP 03/11/2008 (Approximate)   Wt Readings from Last 3 Encounters:  02/19/21 133 lb 6.1 oz (60.5 kg)  02/02/21 147 lb (66.7 kg)  01/22/21 145 lb (65.8 kg)    Physical Exam Constitutional:      General: She is not in acute distress.    Appearance: She is ill-appearing.  HENT:     Head: Normocephalic and atraumatic.  Pulmonary:     Effort: No respiratory distress.  Neurological:     Mental Status: She is oriented to person, place, and time.  Psychiatric:         Attention and Perception: Attention normal.        Behavior: Behavior is cooperative.          Assessment & Plan:    Encounter Diagnoses  Name Primary?   Intractable nausea and vomiting Yes   Not proficient in English language       Pt is encouraged to go to ER for evaluation of possible SBO.  Discussed she likely also needs fluids for dehydration.  Pt agrees to go to ER when this appointment is over.

## 2021-02-28 LAB — CBC
HCT: 37.6 % (ref 36.0–46.0)
Hemoglobin: 12.7 g/dL (ref 12.0–15.0)
MCH: 29.2 pg (ref 26.0–34.0)
MCHC: 33.8 g/dL (ref 30.0–36.0)
MCV: 86.4 fL (ref 80.0–100.0)
Platelets: 175 10*3/uL (ref 150–400)
RBC: 4.35 MIL/uL (ref 3.87–5.11)
RDW: 13.3 % (ref 11.5–15.5)
WBC: 5.9 10*3/uL (ref 4.0–10.5)
nRBC: 0 % (ref 0.0–0.2)

## 2021-02-28 LAB — BASIC METABOLIC PANEL
Anion gap: 13 (ref 5–15)
BUN: 19 mg/dL (ref 8–23)
CO2: 19 mmol/L — ABNORMAL LOW (ref 22–32)
Calcium: 8.3 mg/dL — ABNORMAL LOW (ref 8.9–10.3)
Chloride: 104 mmol/L (ref 98–111)
Creatinine, Ser: 0.46 mg/dL (ref 0.44–1.00)
GFR, Estimated: >60 mL/min (ref >60)
Glucose, Bld: 87 mg/dL (ref 70–99)
Potassium: 3.4 mmol/L — ABNORMAL LOW (ref 3.5–5.1)
Sodium: 136 mmol/L (ref 135–145)

## 2021-02-28 LAB — MAGNESIUM: Magnesium: 1.5 mg/dL — ABNORMAL LOW (ref 1.7–2.4)

## 2021-02-28 LAB — LIPASE, BLOOD: Lipase: 57 U/L — ABNORMAL HIGH (ref 11–51)

## 2021-02-28 MED ORDER — POTASSIUM CHLORIDE 10 MEQ/100ML IV SOLN
10.0000 meq | INTRAVENOUS | Status: AC
  Administered 2021-02-28 (×3): 10 meq via INTRAVENOUS
  Filled 2021-02-28 (×3): qty 100

## 2021-02-28 NOTE — Progress Notes (Signed)
°  Subjective: Patient states she is not significantly better.  She states her pain is in the lower half of the abdomen, especially on the right.  She does describe episodes of when there is a knot in the right groin region which makes her more nauseated.  She currently has no appetite.  She denies any right upper quadrant abdominal pain.  Objective: Vital signs in last 24 hours: Temp:  [97.9 F (36.6 C)-98.3 F (36.8 C)] 97.9 F (36.6 C) (12/21 0513) Pulse Rate:  [94-107] 94 (12/21 1335) Resp:  [17-18] 18 (12/21 1335) BP: (105-117)/(66-79) 111/71 (12/21 1335) SpO2:  [97 %-100 %] 99 % (12/21 1335) Last BM Date: 02/27/21  Intake/Output from previous day: 12/20 0701 - 12/21 0700 In: 2091.9 [I.V.:1091.9; IV Piggyback:1000] Out: -  Intake/Output this shift: No intake/output data recorded.  General appearance: alert, cooperative, and no distress Resp: clear to auscultation bilaterally Cardio: regular rate and rhythm, S1, S2 normal, no murmur, click, rub or gallop GI: Soft with no protuberant right inguinal hernia noted.  Bowel sounds present.  No specific point tenderness noted.  No rigidity is noted.  Lab Results:  Recent Labs    02/27/21 1121 02/28/21 0547  WBC 6.3 5.9  HGB 15.4* 12.7  HCT 44.8 37.6  PLT 230 175   BMET Recent Labs    02/27/21 1121 02/28/21 0547  NA 134* 136  K 3.5 3.4*  CL 98 104  CO2 18* 19*  GLUCOSE 128* 87  BUN 24* 19  CREATININE 0.74 0.46  CALCIUM 9.2 8.3*   PT/INR No results for input(s): LABPROT, INR in the last 72 hours.  Studies/Results: DG ABD ACUTE 2+V W 1V CHEST  Result Date: 02/27/2021 CLINICAL DATA:  Nausea, vomiting EXAM: DG ABDOMEN ACUTE WITH 1 VIEW CHEST COMPARISON:  02/17/2021 FINDINGS: Continued small bowel dilatation concerning for distal small bowel obstruction. Little colonic gas seen. Prior cholecystectomy. No free air organomegaly. Heart and mediastinal contours are within normal limits. No focal opacities or effusions.  No acute bony abnormality. IMPRESSION: Continued small bowel dilatation concerning for small bowel obstruction. No acute cardiopulmonary disease. Electronically Signed   By: Charlett Nose M.D.   On: 02/27/2021 11:21    Anti-infectives: Anti-infectives (From admission, onward)    None       Assessment/Plan:  Impression: Patient is having nausea, vomiting, and generalized malaise of unknown etiology.  She does have a known hernia that was seen on CAT scan.  It is not incarcerated at this time.  Through the interpreter, she does state that she seems to be worse when the hernia is sticking out.  I do think her symptomatology is likely multifactorial in nature.  That being said, we will reevaluate in the morning.  She may need a right inguinal herniorrhaphy to prevent her recurrent symptoms.  LOS: 0 days    Franky Macho 02/28/2021

## 2021-02-28 NOTE — Progress Notes (Addendum)
Pt started on K+. Ordered at 100 mL/hr. Changed rate to 50 mL/hr due to pt intolerance. Pt c/o pain in abdomen after taking Tylenol. Notified MD. Was instructed to hold med.

## 2021-03-01 LAB — CBC
HCT: 34.2 % — ABNORMAL LOW (ref 36.0–46.0)
Hemoglobin: 11.1 g/dL — ABNORMAL LOW (ref 12.0–15.0)
MCH: 28.6 pg (ref 26.0–34.0)
MCHC: 32.5 g/dL (ref 30.0–36.0)
MCV: 88.1 fL (ref 80.0–100.0)
Platelets: 147 10*3/uL — ABNORMAL LOW (ref 150–400)
RBC: 3.88 MIL/uL (ref 3.87–5.11)
RDW: 13.6 % (ref 11.5–15.5)
WBC: 5 10*3/uL (ref 4.0–10.5)
nRBC: 0 % (ref 0.0–0.2)

## 2021-03-01 LAB — BASIC METABOLIC PANEL
Anion gap: 10 (ref 5–15)
BUN: 13 mg/dL (ref 8–23)
CO2: 17 mmol/L — ABNORMAL LOW (ref 22–32)
Calcium: 8 mg/dL — ABNORMAL LOW (ref 8.9–10.3)
Chloride: 110 mmol/L (ref 98–111)
Creatinine, Ser: 0.4 mg/dL — ABNORMAL LOW (ref 0.44–1.00)
GFR, Estimated: >60 mL/min (ref >60)
Glucose, Bld: 75 mg/dL (ref 70–99)
Potassium: 3.3 mmol/L — ABNORMAL LOW (ref 3.5–5.1)
Sodium: 137 mmol/L (ref 135–145)

## 2021-03-01 MED ORDER — POTASSIUM CHLORIDE 10 MEQ/100ML IV SOLN
INTRAVENOUS | Status: AC
  Administered 2021-03-01: 18:00:00 10 meq via INTRAVENOUS
  Filled 2021-03-01: qty 100

## 2021-03-01 MED ORDER — POTASSIUM CHLORIDE 10 MEQ/100ML IV SOLN
10.0000 meq | INTRAVENOUS | Status: AC
  Administered 2021-03-01 (×3): 10 meq via INTRAVENOUS
  Filled 2021-03-01 (×3): qty 100

## 2021-03-01 MED ORDER — ENOXAPARIN SODIUM 40 MG/0.4ML IJ SOSY
40.0000 mg | PREFILLED_SYRINGE | INTRAMUSCULAR | Status: DC
  Administered 2021-03-01 – 2021-03-05 (×5): 40 mg via SUBCUTANEOUS
  Filled 2021-03-01 (×5): qty 0.4

## 2021-03-01 NOTE — Progress Notes (Signed)
°  Subjective: Patient continues to have generalized malaise with abdominal discomfort, especially in the right lower quadrant.  No emesis has been noted.  Objective: Vital signs in last 24 hours: Temp:  [97.6 F (36.4 C)-98 F (36.7 C)] 98 F (36.7 C) (12/22 0341) Pulse Rate:  [83-94] 89 (12/22 0341) Resp:  [18-20] 20 (12/22 0341) BP: (111-122)/(61-71) 120/61 (12/22 0341) SpO2:  [96 %-100 %] 96 % (12/22 0341) Last BM Date: 02/27/21  Intake/Output from previous day: 12/21 0701 - 12/22 0700 In: 1046.4 [I.V.:1042.7; IV Piggyback:3.8] Out: -  Intake/Output this shift: No intake/output data recorded.  General appearance: alert, cooperative, and no distress Resp: clear to auscultation bilaterally Cardio: regular rate and rhythm, S1, S2 normal, no murmur, click, rub or gallop GI: Soft, some mild discomfort to deep palpation in the right lower quadrant.  No incarcerated hernia present in the right groin.  No rigidity is noted.  Lab Results:  Recent Labs    02/28/21 0547 03/01/21 0536  WBC 5.9 5.0  HGB 12.7 11.1*  HCT 37.6 34.2*  PLT 175 147*   BMET Recent Labs    02/28/21 0547 03/01/21 0536  NA 136 137  K 3.4* 3.3*  CL 104 110  CO2 19* 17*  GLUCOSE 87 75  BUN 19 13  CREATININE 0.46 0.40*  CALCIUM 8.3* 8.0*   PT/INR No results for input(s): LABPROT, INR in the last 72 hours.  Studies/Results: DG ABD ACUTE 2+V W 1V CHEST  Result Date: 02/27/2021 CLINICAL DATA:  Nausea, vomiting EXAM: DG ABDOMEN ACUTE WITH 1 VIEW CHEST COMPARISON:  02/17/2021 FINDINGS: Continued small bowel dilatation concerning for distal small bowel obstruction. Little colonic gas seen. Prior cholecystectomy. No free air organomegaly. Heart and mediastinal contours are within normal limits. No focal opacities or effusions. No acute bony abnormality. IMPRESSION: Continued small bowel dilatation concerning for small bowel obstruction. No acute cardiopulmonary disease. Electronically Signed   By: Charlett Nose M.D.   On: 02/27/2021 11:21    Anti-infectives: Anti-infectives (From admission, onward)    None       Assessment/Plan: Impression: Intermittent abdominal pain and nausea with known right inguinal hernia that had been causing a bowel obstruction in the past. Plan: We will proceed with a right inguinal herniorrhaphy with mesh on 03/02/2021.  The risks and benefits of the procedure including bleeding, infection, mesh use, the possibility of recurrence of the hernia were fully explained to the patient through an interpreter, who gave informed consent.  LOS: 1 day    Franky Macho 03/01/2021

## 2021-03-01 NOTE — H&P (View-Only) (Signed)
°  Subjective: Patient continues to have generalized malaise with abdominal discomfort, especially in the right lower quadrant.  No emesis has been noted.  Objective: Vital signs in last 24 hours: Temp:  [97.6 F (36.4 C)-98 F (36.7 C)] 98 F (36.7 C) (12/22 0341) Pulse Rate:  [83-94] 89 (12/22 0341) Resp:  [18-20] 20 (12/22 0341) BP: (111-122)/(61-71) 120/61 (12/22 0341) SpO2:  [96 %-100 %] 96 % (12/22 0341) Last BM Date: 02/27/21  Intake/Output from previous day: 12/21 0701 - 12/22 0700 In: 1046.4 [I.V.:1042.7; IV Piggyback:3.8] Out: -  Intake/Output this shift: No intake/output data recorded.  General appearance: alert, cooperative, and no distress Resp: clear to auscultation bilaterally Cardio: regular rate and rhythm, S1, S2 normal, no murmur, click, rub or gallop GI: Soft, some mild discomfort to deep palpation in the right lower quadrant.  No incarcerated hernia present in the right groin.  No rigidity is noted.  Lab Results:  Recent Labs    02/28/21 0547 03/01/21 0536  WBC 5.9 5.0  HGB 12.7 11.1*  HCT 37.6 34.2*  PLT 175 147*   BMET Recent Labs    02/28/21 0547 03/01/21 0536  NA 136 137  K 3.4* 3.3*  CL 104 110  CO2 19* 17*  GLUCOSE 87 75  BUN 19 13  CREATININE 0.46 0.40*  CALCIUM 8.3* 8.0*   PT/INR No results for input(s): LABPROT, INR in the last 72 hours.  Studies/Results: DG ABD ACUTE 2+V W 1V CHEST  Result Date: 02/27/2021 CLINICAL DATA:  Nausea, vomiting EXAM: DG ABDOMEN ACUTE WITH 1 VIEW CHEST COMPARISON:  02/17/2021 FINDINGS: Continued small bowel dilatation concerning for distal small bowel obstruction. Little colonic gas seen. Prior cholecystectomy. No free air organomegaly. Heart and mediastinal contours are within normal limits. No focal opacities or effusions. No acute bony abnormality. IMPRESSION: Continued small bowel dilatation concerning for small bowel obstruction. No acute cardiopulmonary disease. Electronically Signed   By: Charlett Nose M.D.   On: 02/27/2021 11:21    Anti-infectives: Anti-infectives (From admission, onward)    None       Assessment/Plan: Impression: Intermittent abdominal pain and nausea with known right inguinal hernia that had been causing a bowel obstruction in the past. Plan: We will proceed with a right inguinal herniorrhaphy with mesh on 03/02/2021.  The risks and benefits of the procedure including bleeding, infection, mesh use, the possibility of recurrence of the hernia were fully explained to the patient through an interpreter, who gave informed consent.  LOS: 1 day    Franky Macho 03/01/2021

## 2021-03-02 ENCOUNTER — Encounter (HOSPITAL_COMMUNITY)
Admission: EM | Disposition: A | Discharge status: Home / Self Care | Payer: Self-pay | Source: Ambulatory Visit | Attending: General Surgery

## 2021-03-02 ENCOUNTER — Inpatient Hospital Stay (HOSPITAL_COMMUNITY): Payer: Self-pay | Admitting: Anesthesiology

## 2021-03-02 ENCOUNTER — Encounter (HOSPITAL_COMMUNITY): Payer: Self-pay | Admitting: General Surgery

## 2021-03-02 DIAGNOSIS — K4030 Unilateral inguinal hernia, with obstruction, without gangrene, not specified as recurrent: Secondary | ICD-10-CM

## 2021-03-02 HISTORY — PX: BOWEL RESECTION: SHX1257

## 2021-03-02 HISTORY — PX: INGUINAL HERNIA REPAIR: SHX194

## 2021-03-02 SURGERY — REPAIR, HERNIA, INGUINAL, ADULT
Anesthesia: General | Site: Inguinal | Laterality: Right

## 2021-03-02 MED ORDER — LIDOCAINE 2% (20 MG/ML) 5 ML SYRINGE
INTRAMUSCULAR | Status: DC | PRN
  Administered 2021-03-02: 100 mg via INTRAVENOUS

## 2021-03-02 MED ORDER — CHLORHEXIDINE GLUCONATE CLOTH 2 % EX PADS
6.0000 | MEDICATED_PAD | Freq: Once | CUTANEOUS | Status: AC
  Administered 2021-03-02: 08:00:00 6 via TOPICAL

## 2021-03-02 MED ORDER — FENTANYL CITRATE (PF) 100 MCG/2ML IJ SOLN
INTRAMUSCULAR | Status: DC | PRN
  Administered 2021-03-02: 50 ug via INTRAVENOUS
  Administered 2021-03-02 (×2): 100 ug via INTRAVENOUS

## 2021-03-02 MED ORDER — MIDAZOLAM HCL 2 MG/2ML IJ SOLN
INTRAMUSCULAR | Status: AC
  Filled 2021-03-02: qty 2

## 2021-03-02 MED ORDER — SODIUM CHLORIDE 0.9 % IV SOLN: INTRAVENOUS | Status: DC

## 2021-03-02 MED ORDER — ROCURONIUM BROMIDE 10 MG/ML (PF) SYRINGE
PREFILLED_SYRINGE | INTRAVENOUS | Status: DC | PRN
  Administered 2021-03-02: 40 mg via INTRAVENOUS

## 2021-03-02 MED ORDER — CHLORHEXIDINE GLUCONATE CLOTH 2 % EX PADS
6.0000 | MEDICATED_PAD | Freq: Once | CUTANEOUS | Status: AC
  Administered 2021-03-04: 21:00:00 6 via TOPICAL

## 2021-03-02 MED ORDER — BUPIVACAINE HCL (300 MG DOSE) 3 X 100 MG IL IMPL
DRUG_IMPLANT | Status: DC | PRN
  Administered 2021-03-02: 300 mg

## 2021-03-02 MED ORDER — PROPOFOL 10 MG/ML IV BOLUS
INTRAVENOUS | Status: DC | PRN
  Administered 2021-03-02: 100 mg via INTRAVENOUS

## 2021-03-02 MED ORDER — LACTATED RINGERS IV SOLN: INTRAVENOUS | Status: DC | PRN

## 2021-03-02 MED ORDER — PROPOFOL 10 MG/ML IV BOLUS
INTRAVENOUS | Status: AC
  Filled 2021-03-02: qty 20

## 2021-03-02 MED ORDER — MIDAZOLAM HCL 2 MG/2ML IJ SOLN
2.0000 mg | Freq: Once | INTRAMUSCULAR | Status: AC
  Administered 2021-03-02: 09:00:00 2 mg via INTRAVENOUS

## 2021-03-02 MED ORDER — CEFAZOLIN SODIUM-DEXTROSE 2-4 GM/100ML-% IV SOLN
INTRAVENOUS | Status: AC
  Filled 2021-03-02: qty 100

## 2021-03-02 MED ORDER — BUPIVACAINE HCL (300 MG DOSE) 3 X 100 MG IL IMPL
DRUG_IMPLANT | Status: AC
  Filled 2021-03-02: qty 100

## 2021-03-02 MED ORDER — SUCCINYLCHOLINE CHLORIDE 200 MG/10ML IV SOSY
PREFILLED_SYRINGE | INTRAVENOUS | Status: DC | PRN
  Administered 2021-03-02: 100 mg via INTRAVENOUS

## 2021-03-02 MED ORDER — PHENYLEPHRINE 40 MCG/ML (10ML) SYRINGE FOR IV PUSH (FOR BLOOD PRESSURE SUPPORT)
PREFILLED_SYRINGE | INTRAVENOUS | Status: DC | PRN
  Administered 2021-03-02 (×2): 200 ug via INTRAVENOUS

## 2021-03-02 MED ORDER — DEXAMETHASONE SODIUM PHOSPHATE 10 MG/ML IJ SOLN
INTRAMUSCULAR | Status: DC | PRN
  Administered 2021-03-02: 5 mg via INTRAVENOUS

## 2021-03-02 MED ORDER — SODIUM CHLORIDE 0.9 % IR SOLN
Status: DC | PRN
  Administered 2021-03-02: 1000 mL

## 2021-03-02 MED ORDER — CEFAZOLIN SODIUM-DEXTROSE 2-4 GM/100ML-% IV SOLN
2.0000 g | INTRAVENOUS | Status: AC
  Administered 2021-03-02: 09:00:00 2 g via INTRAVENOUS

## 2021-03-02 MED ORDER — FENTANYL CITRATE (PF) 250 MCG/5ML IJ SOLN
INTRAMUSCULAR | Status: AC
  Filled 2021-03-02: qty 5

## 2021-03-02 MED ORDER — POVIDONE-IODINE 10 % EX OINT
TOPICAL_OINTMENT | CUTANEOUS | Status: AC
  Filled 2021-03-02: qty 1

## 2021-03-02 MED ORDER — PHENYLEPHRINE HCL-NACL 20-0.9 MG/250ML-% IV SOLN: INTRAVENOUS | Status: DC | PRN

## 2021-03-02 SURGICAL SUPPLY — 41 items
CLOTH BEACON ORANGE TIMEOUT ST (SAFETY) ×3 IMPLANT
COVER LIGHT HANDLE STERIS (MISCELLANEOUS) ×6 IMPLANT
DERMABOND ADVANCED (GAUZE/BANDAGES/DRESSINGS) ×1
DERMABOND ADVANCED .7 DNX12 (GAUZE/BANDAGES/DRESSINGS) ×2 IMPLANT
ELECT REM PT RETURN 9FT ADLT (ELECTROSURGICAL) ×3
ELECTRODE REM PT RTRN 9FT ADLT (ELECTROSURGICAL) ×2 IMPLANT
GAUZE 4X4 16PLY ~~LOC~~+RFID DBL (SPONGE) ×4 IMPLANT
GAUZE SPONGE 4X4 12PLY STRL (GAUZE/BANDAGES/DRESSINGS) ×3 IMPLANT
GAUZE SPONGE 4X4 12PLY STRL LF (GAUZE/BANDAGES/DRESSINGS) ×1 IMPLANT
GLOVE SURG POLYISO LF SZ7.5 (GLOVE) ×3 IMPLANT
GLOVE SURG UNDER POLY LF SZ7 (GLOVE) ×7 IMPLANT
GOWN STRL REUS W/TWL LRG LVL3 (GOWN DISPOSABLE) ×9 IMPLANT
INST SET MINOR GENERAL (KITS) ×3 IMPLANT
KIT TURNOVER KIT A (KITS) ×3 IMPLANT
LIGASURE IMPACT 36 18CM CVD LR (INSTRUMENTS) ×1 IMPLANT
MANIFOLD NEPTUNE II (INSTRUMENTS) ×3 IMPLANT
NS IRRIG 1000ML POUR BTL (IV SOLUTION) ×4 IMPLANT
PACK MINOR (CUSTOM PROCEDURE TRAY) ×3 IMPLANT
PAD ARMBOARD 7.5X6 YLW CONV (MISCELLANEOUS) ×3 IMPLANT
RELOAD LINEAR CUT PROX 55 BLUE (ENDOMECHANICALS) ×6 IMPLANT
RELOAD STAPLE 55 3.8 BLU REG (ENDOMECHANICALS) IMPLANT
SET BASIN LINEN APH (SET/KITS/TRAYS/PACK) ×3 IMPLANT
SOL PREP PROV IODINE SCRUB 4OZ (MISCELLANEOUS) ×3 IMPLANT
SPONGE T-LAP 18X18 ~~LOC~~+RFID (SPONGE) ×2 IMPLANT
STAPLER GUN LINEAR PROX 60 (STAPLE) ×1 IMPLANT
STAPLER PROXIMATE 55 BLUE (STAPLE) ×1 IMPLANT
STAPLER VISISTAT (STAPLE) ×1 IMPLANT
SUT CHROMIC 2 0 SH (SUTURE) ×2 IMPLANT
SUT ETHIBOND 0 MO6 C/R (SUTURE) ×2 IMPLANT
SUT MNCRL AB 4-0 PS2 18 (SUTURE) ×3 IMPLANT
SUT NOVA NAB GS-22 2 2-0 T-19 (SUTURE) ×4 IMPLANT
SUT SILK 3 0 (SUTURE)
SUT SILK 3 0 SH CR/8 (SUTURE) ×2 IMPLANT
SUT SILK 3-0 18XBRD TIE 12 (SUTURE) IMPLANT
SUT VIC AB 2-0 CT1 27 (SUTURE) ×1
SUT VIC AB 2-0 CT1 TAPERPNT 27 (SUTURE) ×2 IMPLANT
SUT VIC AB 3-0 SH 27 (SUTURE) ×1
SUT VIC AB 3-0 SH 27X BRD (SUTURE) ×2 IMPLANT
SUT VICRYL AB 2 0 TIES (SUTURE) IMPLANT
SUT VICRYL AB 3 0 TIES (SUTURE) IMPLANT
TAPE HYPAFIX 4 X10 (GAUZE/BANDAGES/DRESSINGS) ×1 IMPLANT

## 2021-03-02 NOTE — Transfer of Care (Signed)
Immediate Anesthesia Transfer of Care Note  Patient: [name]  Procedure(s) Performed: HERNIA REPAIR INGUINAL ADULT (Right: Inguinal) PARTIAL SMALL BOWEL RESECTION (Abdomen)  Patient Location: PACU  Anesthesia Type:General  Level of Consciousness: awake, alert , oriented and patient cooperative  Airway & Oxygen Therapy: Patient Spontanous Breathing and Patient connected to face mask oxygen  Post-op Assessment: Report given to RN, Post -op Vital signs reviewed and stable and Patient moving all extremities X 4  Post vital signs: Reviewed and stable  Last Vitals:  Vitals Value Taken Time  BP 129/72 03/02/21 1045  Temp 36.6 C 03/02/21 1044  Pulse 114 03/02/21 1048  Resp 17 03/02/21 1048  SpO2 100 % 03/02/21 1048  Vitals shown include unvalidated device data.  Last Pain:  Vitals:   03/02/21 0755  TempSrc: Oral  PainSc: 6          Complications: No notable events documented.

## 2021-03-02 NOTE — Anesthesia Postprocedure Evaluation (Signed)
Anesthesia Post Note  Patient: [name]  Procedure(s) Performed: HERNIA REPAIR INGUINAL ADULT (Right: Inguinal) PARTIAL SMALL BOWEL RESECTION (Abdomen)  Patient location during evaluation: PACU Anesthesia Type: General Level of consciousness: awake and alert and oriented Pain management: pain level controlled Vital Signs Assessment: post-procedure vital signs reviewed and stable Respiratory status: spontaneous breathing, nonlabored ventilation and respiratory function stable Cardiovascular status: blood pressure returned to baseline and stable Postop Assessment: no apparent nausea or vomiting Anesthetic complications: no   No notable events documented.   Last Vitals:  Vitals:   03/02/21 1115 03/02/21 1129  BP: 137/69 128/69  Pulse: (!) 104 96  Resp: 16 18  Temp:  36.5 C  SpO2: 98% 98%    Last Pain:  Vitals:   03/02/21 1200  TempSrc:   PainSc: 8                  Chandra Feger C Harlow Basley

## 2021-03-02 NOTE — Anesthesia Preprocedure Evaluation (Addendum)
Anesthesia Evaluation  Patient identified by MRN, date of birth, ID band Patient awake    Reviewed: Allergy & Precautions, H&P , NPO status , Patient's Chart, lab work & pertinent test results  Airway Mallampati: II  TM Distance: >3 FB Neck ROM: Full    Dental  (+) Dental Advisory Given, Loose, Missing, Poor Dentition,    Pulmonary neg pulmonary ROS,    Pulmonary exam normal breath sounds clear to auscultation       Cardiovascular Exercise Tolerance: Good negative cardio ROS Normal cardiovascular exam Rhythm:Regular Rate:Normal  27-Feb-2021 11:28:20 [facility] ROUTINE RECORD 09-10-1960 (60 yr) Female Other PR interval 131 ms QRS duration 96 ms QT/QTcB 326/443 ms P-R-T axes 68 67 18 Sinus tachycardia   Neuro/Psych negative neurological ROS  negative psych ROS   GI/Hepatic Neg liver ROS, GERD  Medicated,  Endo/Other  negative endocrine ROS  Renal/GU negative Renal ROS  negative genitourinary   Musculoskeletal negative musculoskeletal ROS (+)   Abdominal   Peds negative pediatric ROS (+)  Hematology negative hematology ROS (+)   Anesthesia Other Findings   Reproductive/Obstetrics negative OB ROS                          Anesthesia Physical  Anesthesia Plan  ASA: 2  Anesthesia Plan: General   Post-op Pain Management: Dilaudid IV   Induction:   PONV Risk Score and Plan: 4 or greater and Ondansetron, Dexamethasone and Midazolam  Airway Management Planned: Oral ETT  Additional Equipment:   Intra-op Plan:   Post-operative Plan: Extubation in OR  Informed Consent: I have reviewed the patients History and Physical, chart, labs and discussed the procedure including the risks, benefits and alternatives for the proposed anesthesia with the patient or authorized representative who has indicated his/her understanding and acceptance.     Dental advisory given  and Interpreter used for interveiw  Plan Discussed with: CRNA and Surgeon  Anesthesia Plan Comments: (Risk of loosing and possible aspirating teeth were explained.)       Anesthesia Quick Evaluation

## 2021-03-02 NOTE — Op Note (Signed)
Patient:  Janice Wells  DOB:  1961/01/24  MRN:  [id number]   Preop Diagnosis:  Right inguinal hernia    Postop Diagnosis:  Same, strangulated small bowel  Procedure:  Partial small bowel resection, right inguinal herniorrhaphy  Surgeon:  Franky Macho, MD   Anes:  GEN  Indications:  Patient is a 60yo HF who presents with a symptomatic right inguinal hernia.  The risks and benefits of the procedure including bleeding, infection, mesh use, and the possibility of recurrence of the hernia were fully explained to the patient, who gave informed consent.  Procedure note: The patient was placed in supine position.  After induction of general endotracheal anesthesia, the abdomen was prepped and draped using the usual sterile technique with Betadine.  Surgical site confirmation was performed.  A transverse incision was made in the right groin region down to the external oblique aponeurosis.  The aponeurosis was incised.  On inspection, the patient had a large hernia sac with retained omentum and a thickened wall.  There was also edematous changes of the soft tissue.  This appeared to be an indirect hernia.  I did not open up the hernia sac and a loop of bowel was incarcerated in it.  While trying to free away the small bowel from the hernia sac, I did enter into the small intestine.  Any luminal contents were evacuated using the suction.  This appeared to be the point of the partial small bowel obstruction.  The thickened hernia sac was excised using the LigaSure down to the peritoneal layer.  I was able to free the small bowel from the hernia sac and eviscerated through the right inguinal hernia.  A GIA 55 stapler was placed proximally and distally around the small bowel.  The mesentery was divided using the LigaSure.  The specimen was removed from the operative field.  A side to side anastomosis was performed using a GIA 55 stapler.  The enterotomy was closed using a TA 60 stapler.  The staple  line was bolstered using 3-0 silk Lembert sutures.  The mesenteric defect was closed using a 2-0 Chromic Gut running suture.  The bowel was returned into the abdominal cavity in an orderly fashion.  The pelvis was copiously irrigated with normal saline.  The peritoneal layer was reapproximated using a 2-0 Chromic Gut running suture.  I then repaired the right inguinal hernia in a Bassini-like fashion using 0 Ethibond interrupted sutures.  Xaracoll was placed under and over the external Bleich aponeurosis.  The aponeurosis was closed using a 2-0 Vicryl running suture.  The subcutaneous layer was reapproximated using a 3-0 Vicryl interrupted suture.  The skin was closed using staples.  Betadine ointment and dry sterile dressings were applied.  All tape and needle counts were correct at the end of the procedure.  The patient was awakened and transferred to PACU in stable condition.  Complications: None  EBL: Minimal  Specimen: Small bowel, hernia sac

## 2021-03-02 NOTE — Progress Notes (Signed)
°  Transition of Care [facility]) Screening Note   Patient Details  Name: [name] Date of Birth: [birth year]   Transition of Care [facility]) CM/SW Contact:    Leitha Bleak, RN Phone Number: 03/02/2021, 2:37 PM    Transition of Care Department [facility]) has reviewed patient and no TOC needs have been identified at this time. We will continue to monitor patient advancement through interdisciplinary progression rounds. If new patient transition needs arise, please place a TOC consult.

## 2021-03-02 NOTE — Progress Notes (Signed)
Abd surgical dressing clean, dry and intact.  Given scheduled tylenol and communicated through interpreter computer and stated that she had just a little pain with some burning sensation. Had no requests.  Son at bedside.

## 2021-03-02 NOTE — Anesthesia Procedure Notes (Signed)
Procedure Name: Intubation Date/Time: 03/02/2021 8:58 AM Performed by: Jonna Munro, CRNA Pre-anesthesia Checklist: Patient identified, Emergency Drugs available, Suction available, Patient being monitored and Timeout performed Patient Re-evaluated:Patient Re-evaluated prior to induction Oxygen Delivery Method: Circle system utilized Preoxygenation: Pre-oxygenation with 100% oxygen Induction Type: IV induction Laryngoscope Size: Mac and 3 Grade View: Grade I Tube type: Oral Tube size: 7.0 mm Number of attempts: 1 Airway Equipment and Method: Stylet Placement Confirmation: ETT inserted through vocal cords under direct vision, positive ETCO2 and breath sounds checked- equal and bilateral Secured at: 22 cm Tube secured with: Tape Dental Injury: Teeth and Oropharynx as per pre-operative assessment

## 2021-03-02 NOTE — Interval H&P Note (Signed)
History and Physical Interval Note:  03/02/2021 8:30 AM  [name]  has presented today for surgery, with the diagnosis of right inguinal hernia.  The various methods of treatment have been discussed with the patient and family. After consideration of risks, benefits and other options for treatment, the patient has consented to  Procedure(s): HERNIA REPAIR INGUINAL ADULT (Right) as a surgical intervention.  The patient's history has been reviewed, patient examined, no change in status, stable for surgery.  I have reviewed the patient's chart and labs.  Questions were answered to the patient's satisfaction.     Franky Macho

## 2021-03-03 LAB — PHOSPHORUS: Phosphorus: 3 mg/dL (ref 2.5–4.6)

## 2021-03-03 LAB — BASIC METABOLIC PANEL
Anion gap: 8 (ref 5–15)
BUN: 5 mg/dL — ABNORMAL LOW (ref 8–23)
CO2: 16 mmol/L — ABNORMAL LOW (ref 22–32)
Calcium: 7.6 mg/dL — ABNORMAL LOW (ref 8.9–10.3)
Chloride: 109 mmol/L (ref 98–111)
Creatinine, Ser: 0.36 mg/dL — ABNORMAL LOW (ref 0.44–1.00)
GFR, Estimated: >60 mL/min (ref >60)
Glucose, Bld: 85 mg/dL (ref 70–99)
Potassium: 3.5 mmol/L (ref 3.5–5.1)
Sodium: 133 mmol/L — ABNORMAL LOW (ref 135–145)

## 2021-03-03 LAB — CBC
HCT: 30.6 % — ABNORMAL LOW (ref 36.0–46.0)
Hemoglobin: 10.2 g/dL — ABNORMAL LOW (ref 12.0–15.0)
MCH: 28.9 pg (ref 26.0–34.0)
MCHC: 33.3 g/dL (ref 30.0–36.0)
MCV: 86.7 fL (ref 80.0–100.0)
Platelets: 125 10*3/uL — ABNORMAL LOW (ref 150–400)
RBC: 3.53 MIL/uL — ABNORMAL LOW (ref 3.87–5.11)
RDW: 13.9 % (ref 11.5–15.5)
WBC: 7.4 10*3/uL (ref 4.0–10.5)
nRBC: 0 % (ref 0.0–0.2)

## 2021-03-03 LAB — MAGNESIUM: Magnesium: 1.3 mg/dL — ABNORMAL LOW (ref 1.7–2.4)

## 2021-03-03 MED ORDER — MORPHINE SULFATE (PF) 2 MG/ML IV SOLN
2.0000 mg | INTRAVENOUS | Status: DC | PRN
  Administered 2021-03-03 (×2): 2 mg via INTRAVENOUS
  Filled 2021-03-03 (×2): qty 1

## 2021-03-03 MED ORDER — MAGNESIUM SULFATE 2 GM/50ML IV SOLN
2.0000 g | Freq: Once | INTRAVENOUS | Status: AC
  Administered 2021-03-03: 13:00:00 2 g via INTRAVENOUS
  Filled 2021-03-03: qty 50

## 2021-03-03 MED ORDER — KCL IN DEXTROSE-NACL 20-5-0.45 MEQ/L-%-% IV SOLN: INTRAVENOUS | Status: DC

## 2021-03-03 NOTE — Progress Notes (Signed)
Patient ambulated to bathroom with walker and one assist. Patient complains of mild pain when getting up, patient reassured that pain is normal after surgery. Patient son at bedside. Patient voices no complaints or needs at this time

## 2021-03-03 NOTE — Progress Notes (Signed)
1 Day Post-Op  Subjective: Patient having mild incisional pain.  She has voided.  No bowel movement yet.  Objective: Vital signs in last 24 hours: Temp:  [97.4 F (36.3 C)-98.5 F (36.9 C)] 98.5 F (36.9 C) (12/24 0540) Pulse Rate:  [97-105] 105 (12/24 0540) Resp:  [16-20] 17 (12/24 0540) BP: (101-110)/(56-64) 101/56 (12/24 0540) SpO2:  [97 %-100 %] 100 % (12/24 0540) Last BM Date: 02/27/21  Intake/Output from previous day: 12/23 0701 - 12/24 0700 In: 5564.1 [P.O.:120; I.V.:5344.1; IV Piggyback:100] Out: 750 [Urine:650; Blood:100] Intake/Output this shift: No intake/output data recorded.  General appearance: alert, cooperative, and no distress Resp: clear to auscultation bilaterally Cardio: regular rate and rhythm, S1, S2 normal, no murmur, click, rub or gallop GI: Soft, incision healing well.  Minimal bowel sounds appreciated.  Nondistended.  Lab Results:  Recent Labs    03/01/21 0536 03/03/21 0514  WBC 5.0 7.4  HGB 11.1* 10.2*  HCT 34.2* 30.6*  PLT 147* 125*   BMET Recent Labs    03/01/21 0536 03/03/21 0514  NA 137 133*  K 3.3* 3.5  CL 110 109  CO2 17* 16*  GLUCOSE 75 85  BUN 13 5*  CREATININE 0.40* 0.36*  CALCIUM 8.0* 7.6*   PT/INR No results for input(s): LABPROT, INR in the last 72 hours.  Studies/Results: No results found.  Anti-infectives: Anti-infectives (From admission, onward)    Start     Dose/Rate Route Frequency Ordered Stop   03/02/21 0845  ceFAZolin (ANCEF) IVPB 2g/100 mL premix        2 g 200 mL/hr over 30 Minutes Intravenous On call to O.R. 03/02/21 0757 03/02/21 0920   03/02/21 0757  ceFAZolin (ANCEF) 2-4 GM/100ML-% IVPB       Note to Pharmacy: Ulanda Edison J: cabinet override      03/02/21 0757 03/02/21 0925       Assessment/Plan: s/p Procedure(s): HERNIA REPAIR INGUINAL ADULT PARTIAL SMALL BOWEL RESECTION Impression: Stable on postoperative day 1.  Through the interpreter, the patient said she was having mild to  moderate incisional pain.  Will advance to full liquid diet.  She was told that she needs to be sitting up in a chair and ambulating.  Mild hypomagnesemia is noted and this will be addressed.  Will adjust IV fluids.  LOS: 3 days    Franky Macho 03/03/2021

## 2021-03-04 LAB — CBC
HCT: 29.1 % — ABNORMAL LOW (ref 36.0–46.0)
Hemoglobin: 9.9 g/dL — ABNORMAL LOW (ref 12.0–15.0)
MCH: 29.6 pg (ref 26.0–34.0)
MCHC: 34 g/dL (ref 30.0–36.0)
MCV: 86.9 fL (ref 80.0–100.0)
Platelets: 122 10*3/uL — ABNORMAL LOW (ref 150–400)
RBC: 3.35 MIL/uL — ABNORMAL LOW (ref 3.87–5.11)
RDW: 14.3 % (ref 11.5–15.5)
WBC: 8.6 10*3/uL (ref 4.0–10.5)
nRBC: 0 % (ref 0.0–0.2)

## 2021-03-04 LAB — BASIC METABOLIC PANEL
Anion gap: 8 (ref 5–15)
BUN: 5 mg/dL — ABNORMAL LOW (ref 8–23)
CO2: 17 mmol/L — ABNORMAL LOW (ref 22–32)
Calcium: 7.2 mg/dL — ABNORMAL LOW (ref 8.9–10.3)
Chloride: 105 mmol/L (ref 98–111)
Creatinine, Ser: <0.3 mg/dL — ABNORMAL LOW (ref 0.44–1.00)
Glucose, Bld: 136 mg/dL — ABNORMAL HIGH (ref 70–99)
Potassium: 3.4 mmol/L — ABNORMAL LOW (ref 3.5–5.1)
Sodium: 130 mmol/L — ABNORMAL LOW (ref 135–145)

## 2021-03-04 LAB — MAGNESIUM: Magnesium: 1.6 mg/dL — ABNORMAL LOW (ref 1.7–2.4)

## 2021-03-04 LAB — PHOSPHORUS: Phosphorus: 1.8 mg/dL — ABNORMAL LOW (ref 2.5–4.6)

## 2021-03-04 MED ORDER — MAGNESIUM SULFATE 2 GM/50ML IV SOLN
2.0000 g | Freq: Once | INTRAVENOUS | Status: AC
  Administered 2021-03-04: 10:00:00 2 g via INTRAVENOUS
  Filled 2021-03-04: qty 50

## 2021-03-04 MED ORDER — K PHOS MONO-SOD PHOS DI & MONO 155-852-130 MG PO TABS
500.0000 mg | ORAL_TABLET | ORAL | Status: AC
  Administered 2021-03-04 (×4): 500 mg via ORAL
  Filled 2021-03-04 (×4): qty 2

## 2021-03-04 NOTE — Progress Notes (Signed)
2 Days Post-Op  Subjective: Mild incisional pain.  Patient is passing flatus.  Had a small bowel movement.  Objective: Vital signs in last 24 hours: Temp:  [97.9 F (36.6 C)-98.8 F (37.1 C)] 98.1 F (36.7 C) (12/25 0452) Pulse Rate:  [90-107] 107 (12/25 0452) Resp:  [16-17] 16 (12/25 0452) BP: (103-118)/(51-74) 118/60 (12/25 0452) SpO2:  [98 %-100 %] 100 % (12/25 0452) Last BM Date: 02/27/21  Intake/Output from previous day: 12/24 0701 - 12/25 0700 In: 400 [P.O.:400] Out: -  Intake/Output this shift: No intake/output data recorded.  General appearance: alert, cooperative, and no distress Resp: clear to auscultation bilaterally Cardio: regular rate and rhythm, S1, S2 normal, no murmur, click, rub or gallop GI: Soft, incision healing well.  Active bowel sounds appreciated.  Lab Results:  Recent Labs    03/03/21 0514 03/04/21 0433  WBC 7.4 8.6  HGB 10.2* 9.9*  HCT 30.6* 29.1*  PLT 125* 122*   BMET Recent Labs    03/03/21 0514 03/04/21 0433  NA 133* 130*  K 3.5 3.4*  CL 109 105  CO2 16* 17*  GLUCOSE 85 136*  BUN 5* 5*  CREATININE 0.36* <0.30*  CALCIUM 7.6* 7.2*   PT/INR No results for input(s): LABPROT, INR in the last 72 hours.  Studies/Results: No results found.  Anti-infectives: Anti-infectives (From admission, onward)    Start     Dose/Rate Route Frequency Ordered Stop   03/02/21 0845  ceFAZolin (ANCEF) IVPB 2g/100 mL premix        2 g 200 mL/hr over 30 Minutes Intravenous On call to O.R. 03/02/21 0757 03/02/21 0920   03/02/21 0757  ceFAZolin (ANCEF) 2-4 GM/100ML-% IVPB       Note to Pharmacy: Ulanda Edison J: cabinet override      03/02/21 0757 03/02/21 0925       Assessment/Plan: s/p Procedure(s): HERNIA REPAIR INGUINAL ADULT PARTIAL SMALL BOWEL RESECTION Impression: Postoperative day 2.  Patient recovering well.  Hypomagnesemia and hypophosphatemia noted.  This is being addressed.  Bowel function is returning.  Will advance to regular  diet.  Continue to ambulate.  LOS: 4 days    Franky Macho 03/04/2021

## 2021-03-04 NOTE — Progress Notes (Addendum)
MEDICATION RELATED CONSULT NOTE - FOLLOW UP   Pharmacy Consult for phosphorus replacement  Indication: hypophosphatemia   No Known Allergies  Patient Measurements: Height:  (147.3 cm) Weight: 61.2 kg (135 lb) IBW/kg (Calculated) : 40.9   Vital Signs: Temp: 98.1 F (36.7 C) (12/25 0452) BP: 118/60 (12/25 0452) Pulse Rate: 107 (12/25 0452) Intake/Output from previous day: 12/24 0701 - 12/25 0700 In: 400 [P.O.:400] Out: -  Intake/Output from this shift: No intake/output data recorded.  Labs: Recent Labs    03/03/21 0514 03/04/21 0433  WBC 7.4 8.6  HGB 10.2* 9.9*  HCT 30.6* 29.1*  PLT 125* 122*  CREATININE 0.36* <0.30*  MG 1.3* 1.6*  PHOS 3.0 1.8*   CrCl cannot be calculated (This lab value cannot be used to calculate CrCl because it is not a number: <0.30).   Microbiology: Recent Results (from the past 720 hour(s))  Resp Panel by RT-PCR (Flu A&B, Covid) Nasopharyngeal Swab     Status: None   Collection Time: 02/02/21  1:20 PM   Specimen: Nasopharyngeal Swab; Nasopharyngeal(NP) swabs in vial transport medium  Result Value Ref Range Status   SARS Coronavirus 2 by RT PCR NEGATIVE NEGATIVE Final    Comment: (NOTE) SARS-CoV-2 target nucleic acids are NOT DETECTED.  The SARS-CoV-2 RNA is generally detectable in upper respiratory specimens during the acute phase of infection. The lowest concentration of SARS-CoV-2 viral copies this assay can detect is 138 copies/mL. A negative result does not preclude SARS-Cov-2 infection and should not be used as the sole basis for treatment or other patient management decisions. A negative result may occur with  improper specimen collection/handling, submission of specimen other than nasopharyngeal swab, presence of viral mutation(s) within the areas targeted by this assay, and inadequate number of viral copies(<138 copies/mL). A negative result must be combined with clinical observations, patient history, and  epidemiological information. The expected result is Negative.  Fact Sheet for Patients:  BloggerCourse.com  Fact Sheet for Healthcare Providers:  SeriousBroker.it  This test is no t yet approved or cleared by the [place] FDA and  has been authorized for detection and/or diagnosis of SARS-CoV-2 by FDA under an Emergency Use Authorization (EUA). This EUA will remain  in effect (meaning this test can be used) for the duration of the COVID-19 declaration under Section 564(b)(1) of the Act, 21 U.S.C.section 360bbb-3(b)(1), unless the authorization is terminated  or revoked sooner.       Influenza A by PCR NEGATIVE NEGATIVE Final   Influenza B by PCR NEGATIVE NEGATIVE Final    Comment: (NOTE) The Xpert Xpress SARS-CoV-2/FLU/RSV plus assay is intended as an aid in the diagnosis of influenza from Nasopharyngeal swab specimens and should not be used as a sole basis for treatment. Nasal washings and aspirates are unacceptable for Xpert Xpress SARS-CoV-2/FLU/RSV testing.  Fact Sheet for Patients: BloggerCourse.com  Fact Sheet for Healthcare Providers: SeriousBroker.it  This test is not yet approved or cleared by the [place] FDA and has been authorized for detection and/or diagnosis of SARS-CoV-2 by FDA under an Emergency Use Authorization (EUA). This EUA will remain in effect (meaning this test can be used) for the duration of the COVID-19 declaration under Section 564(b)(1) of the Act, 21 U.S.C. section 360bbb-3(b)(1), unless the authorization is terminated or revoked.  Performed at [facility], [address], [place], [place] [address]   Surgical PCR screen     Status: None   Collection Time: 02/02/21 10:52 PM   Specimen: Nasal Mucosa; Nasal  Swab  Result Value Ref Range Status   MRSA, PCR NEGATIVE NEGATIVE Final   Staphylococcus aureus NEGATIVE NEGATIVE Final     Comment: (NOTE) The Xpert SA Assay (FDA approved for NASAL specimens in patients 42 years of age and older), is one component of a comprehensive surveillance program. It is not intended to diagnose infection nor to guide or monitor treatment. Performed at [facility], [address], [place], [place] [address]   Resp Panel by RT-PCR (Flu A&B, Covid) Nasopharyngeal Swab     Status: None   Collection Time: 02/06/21  8:09 PM   Specimen: Nasopharyngeal Swab; Nasopharyngeal(NP) swabs in vial transport medium  Result Value Ref Range Status   SARS Coronavirus 2 by RT PCR NEGATIVE NEGATIVE Final    Comment: (NOTE) SARS-CoV-2 target nucleic acids are NOT DETECTED.  The SARS-CoV-2 RNA is generally detectable in upper respiratory specimens during the acute phase of infection. The lowest concentration of SARS-CoV-2 viral copies this assay can detect is 138 copies/mL. A negative result does not preclude SARS-Cov-2 infection and should not be used as the sole basis for treatment or other patient management decisions. A negative result may occur with  improper specimen collection/handling, submission of specimen other than nasopharyngeal swab, presence of viral mutation(s) within the areas targeted by this assay, and inadequate number of viral copies(<138 copies/mL). A negative result must be combined with clinical observations, patient history, and epidemiological information. The expected result is Negative.  Fact Sheet for Patients:  BloggerCourse.com  Fact Sheet for Healthcare Providers:  SeriousBroker.it  This test is no t yet approved or cleared by the [place] FDA and  has been authorized for detection and/or diagnosis of SARS-CoV-2 by FDA under an Emergency Use Authorization (EUA). This EUA will remain  in effect (meaning this test can be used) for the duration of the COVID-19 declaration under Section 564(b)(1) of the Act,  21 U.S.C.section 360bbb-3(b)(1), unless the authorization is terminated  or revoked sooner.       Influenza A by PCR NEGATIVE NEGATIVE Final   Influenza B by PCR NEGATIVE NEGATIVE Final    Comment: (NOTE) The Xpert Xpress SARS-CoV-2/FLU/RSV plus assay is intended as an aid in the diagnosis of influenza from Nasopharyngeal swab specimens and should not be used as a sole basis for treatment. Nasal washings and aspirates are unacceptable for Xpert Xpress SARS-CoV-2/FLU/RSV testing.  Fact Sheet for Patients: BloggerCourse.com  Fact Sheet for Healthcare Providers: SeriousBroker.it  This test is not yet approved or cleared by the [place] FDA and has been authorized for detection and/or diagnosis of SARS-CoV-2 by FDA under an Emergency Use Authorization (EUA). This EUA will remain in effect (meaning this test can be used) for the duration of the COVID-19 declaration under Section 564(b)(1) of the Act, 21 U.S.C. section 360bbb-3(b)(1), unless the authorization is terminated or revoked.  Performed at [facility], [address] [place], [place] [address]   Resp Panel by RT-PCR (Flu A&B, Covid) Nasopharyngeal Swab     Status: None   Collection Time: 02/15/21 10:02 PM   Specimen: Nasopharyngeal Swab; Nasopharyngeal(NP) swabs in vial transport medium  Result Value Ref Range Status   SARS Coronavirus 2 by RT PCR NEGATIVE NEGATIVE Final    Comment: (NOTE) SARS-CoV-2 target nucleic acids are NOT DETECTED.  The SARS-CoV-2 RNA is generally detectable in upper respiratory specimens during the acute phase of infection. The lowest concentration of SARS-CoV-2 viral copies this assay can detect is 138 copies/mL. A negative result does not  preclude SARS-Cov-2 infection and should not be used as the sole basis for treatment or other patient management decisions. A negative result may occur with  improper specimen collection/handling,  submission of specimen other than nasopharyngeal swab, presence of viral mutation(s) within the areas targeted by this assay, and inadequate number of viral copies(<138 copies/mL). A negative result must be combined with clinical observations, patient history, and epidemiological information. The expected result is Negative.  Fact Sheet for Patients:  BloggerCourse.com  Fact Sheet for Healthcare Providers:  SeriousBroker.it  This test is no t yet approved or cleared by the [place] FDA and  has been authorized for detection and/or diagnosis of SARS-CoV-2 by FDA under an Emergency Use Authorization (EUA). This EUA will remain  in effect (meaning this test can be used) for the duration of the COVID-19 declaration under Section 564(b)(1) of the Act, 21 U.S.C.section 360bbb-3(b)(1), unless the authorization is terminated  or revoked sooner.       Influenza A by PCR NEGATIVE NEGATIVE Final   Influenza B by PCR NEGATIVE NEGATIVE Final    Comment: (NOTE) The Xpert Xpress SARS-CoV-2/FLU/RSV plus assay is intended as an aid in the diagnosis of influenza from Nasopharyngeal swab specimens and should not be used as a sole basis for treatment. Nasal washings and aspirates are unacceptable for Xpert Xpress SARS-CoV-2/FLU/RSV testing.  Fact Sheet for Patients: BloggerCourse.com  Fact Sheet for Healthcare Providers: SeriousBroker.it  This test is not yet approved or cleared by the [place] FDA and has been authorized for detection and/or diagnosis of SARS-CoV-2 by FDA under an Emergency Use Authorization (EUA). This EUA will remain in effect (meaning this test can be used) for the duration of the COVID-19 declaration under Section 564(b)(1) of the Act, 21 U.S.C. section 360bbb-3(b)(1), unless the authorization is terminated or revoked.  Performed at [facility], [address], [place], [place] [address]   Resp Panel by RT-PCR (Flu A&B, Covid) Nasopharyngeal Swab     Status: None   Collection Time: 02/27/21 12:34 PM   Specimen: Nasopharyngeal Swab; Nasopharyngeal(NP) swabs in vial transport medium  Result Value Ref Range Status   SARS Coronavirus 2 by RT PCR NEGATIVE NEGATIVE Final    Comment: (NOTE) SARS-CoV-2 target nucleic acids are NOT DETECTED.  The SARS-CoV-2 RNA is generally detectable in upper respiratory specimens during the acute phase of infection. The lowest concentration of SARS-CoV-2 viral copies this assay can detect is 138 copies/mL. A negative result does not preclude SARS-Cov-2 infection and should not be used as the sole basis for treatment or other patient management decisions. A negative result may occur with  improper specimen collection/handling, submission of specimen other than nasopharyngeal swab, presence of viral mutation(s) within the areas targeted by this assay, and inadequate number of viral copies(<138 copies/mL). A negative result must be combined with clinical observations, patient history, and epidemiological information. The expected result is Negative.  Fact Sheet for Patients:  BloggerCourse.com  Fact Sheet for Healthcare Providers:  SeriousBroker.it  This test is no t yet approved or cleared by the [place] FDA and  has been authorized for detection and/or diagnosis of SARS-CoV-2 by FDA under an Emergency Use Authorization (EUA). This EUA will remain  in effect (meaning this test can be used) for the duration of the COVID-19 declaration under Section 564(b)(1) of the Act, 21 U.S.C.section 360bbb-3(b)(1), unless the authorization is terminated  or revoked sooner.       Influenza A by PCR NEGATIVE NEGATIVE Final   Influenza  B by PCR NEGATIVE NEGATIVE Final    Comment: (NOTE) The Xpert Xpress SARS-CoV-2/FLU/RSV plus assay is intended as an aid in the  diagnosis of influenza from Nasopharyngeal swab specimens and should not be used as a sole basis for treatment. Nasal washings and aspirates are unacceptable for Xpert Xpress SARS-CoV-2/FLU/RSV testing.  Fact Sheet for Patients: BloggerCourse.com  Fact Sheet for Healthcare Providers: SeriousBroker.it  This test is not yet approved or cleared by the [place] FDA and has been authorized for detection and/or diagnosis of SARS-CoV-2 by FDA under an Emergency Use Authorization (EUA). This EUA will remain in effect (meaning this test can be used) for the duration of the COVID-19 declaration under Section 564(b)(1) of the Act, 21 U.S.C. section 360bbb-3(b)(1), unless the authorization is terminated or revoked.  Performed at [facility] [address], [place], [place] [address]     Medications:  Medications Prior to Admission  Medication Sig Dispense Refill Last Dose   docusate sodium (COLACE) 100 MG capsule Take 1 capsule (100 mg total) by mouth 2 (two) times daily. 60 capsule 2    Multiple Vitamin (MULTIVITAMIN WITH MINERALS) TABS tablet Take 1 tablet by mouth daily.      ondansetron (ZOFRAN) 4 MG tablet Take 1 tablet (4 mg total) by mouth every 8 (eight) hours as needed for nausea or vomiting. 20 tablet 0 Past Week   pantoprazole (PROTONIX) 40 MG tablet Take 1 tablet (40 mg total) by mouth daily. Tome una tableta por boca diaria 30 tablet 3 02/26/2021   Scheduled:   acetaminophen  1,000 mg Oral Q6H   Chlorhexidine Gluconate Cloth  6 each Topical Once   docusate sodium  100 mg Oral BID   enoxaparin (LOVENOX) injection  40 mg Subcutaneous Q24H   multivitamin with minerals  1 tablet Oral Daily   pantoprazole  40 mg Oral Daily   phosphorus  500 mg Oral Q4H   Infusions:   dextrose 5 % and 0.45 % NaCl with KCl 20 mEq/L 75 mL/hr at 03/04/21 0605   magnesium sulfate bolus IVPB     PRN: ketorolac, morphine injection, ondansetron  **OR** ondansetron (ZOFRAN) IV, oxyCODONE Anti-infectives (From admission, onward)    Start     Dose/Rate Route Frequency Ordered Stop   03/02/21 0845  ceFAZolin (ANCEF) IVPB 2g/100 mL premix        2 g 200 mL/hr over 30 Minutes Intravenous On call to O.R. 03/02/21 0757 03/02/21 0920   03/02/21 0757  ceFAZolin (ANCEF) 2-4 GM/100ML-% IVPB       Note to Pharmacy: Ulanda Edison J: cabinet override      03/02/21 0757 03/02/21 0925       Assessment: 60 YOF 2 days post op, Surgery consulted pharmacy to treat and monitor hypophosphatemia.   Phos 1.8  Goal of Therapy:  Phos 2.5-4.6 mg/dL   Plan:  Potassium phosphate neutral tablet, 2 tabs po q4h x 4 doses Check Phos level daily   Siham Bucaro 03/04/2021,8:57 AM

## 2021-03-05 LAB — CBC
HCT: 26.3 % — ABNORMAL LOW (ref 36.0–46.0)
Hemoglobin: 9 g/dL — ABNORMAL LOW (ref 12.0–15.0)
MCH: 29.6 pg (ref 26.0–34.0)
MCHC: 34.2 g/dL (ref 30.0–36.0)
MCV: 86.5 fL (ref 80.0–100.0)
Platelets: 133 10*3/uL — ABNORMAL LOW (ref 150–400)
RBC: 3.04 MIL/uL — ABNORMAL LOW (ref 3.87–5.11)
RDW: 14.3 % (ref 11.5–15.5)
WBC: 7.5 10*3/uL (ref 4.0–10.5)
nRBC: 0 % (ref 0.0–0.2)

## 2021-03-05 LAB — BASIC METABOLIC PANEL
Anion gap: 11 (ref 5–15)
BUN: 6 mg/dL — ABNORMAL LOW (ref 8–23)
CO2: 19 mmol/L — ABNORMAL LOW (ref 22–32)
Calcium: 7.2 mg/dL — ABNORMAL LOW (ref 8.9–10.3)
Chloride: 103 mmol/L (ref 98–111)
Creatinine, Ser: <0.3 mg/dL — ABNORMAL LOW (ref 0.44–1.00)
Glucose, Bld: 116 mg/dL — ABNORMAL HIGH (ref 70–99)
Potassium: 3.1 mmol/L — ABNORMAL LOW (ref 3.5–5.1)
Sodium: 133 mmol/L — ABNORMAL LOW (ref 135–145)

## 2021-03-05 LAB — PHOSPHORUS: Phosphorus: 3.3 mg/dL (ref 2.5–4.6)

## 2021-03-05 LAB — MAGNESIUM: Magnesium: 1.7 mg/dL (ref 1.7–2.4)

## 2021-03-05 MED ORDER — POTASSIUM CHLORIDE CRYS ER 20 MEQ PO TBCR
20.0000 meq | EXTENDED_RELEASE_TABLET | Freq: Once | ORAL | Status: AC
  Administered 2021-03-05: 13:00:00 20 meq via ORAL
  Filled 2021-03-05: qty 1

## 2021-03-05 MED ORDER — HYDROCODONE-ACETAMINOPHEN 5-325 MG PO TABS: 1.0000 | ORAL_TABLET | Freq: Four times a day (QID) | ORAL | 0 refills | Status: DC | PRN

## 2021-03-05 NOTE — Discharge Summary (Signed)
Physician Discharge Summary  Patient ID: Janice Wells MRN: [id number] DOB/AGE: Jan 27, 1961 60 y.o.  Admit date: 02/27/2021 Discharge date: 03/05/2021  Admission Diagnoses: Lower abdominal pain, partial bowel obstruction versus ileus  Discharge Diagnoses: Same, incarcerated right inguinal hernia Principal Problem:   Ileus following gastrointestinal surgery [facility]) Active Problems:   Incarcerated right inguinal hernia Status post laparoscopic cholecystectomy  Discharged Condition: good  Hospital Course: Patient is a 60 year old Hispanic female status post laparoscopic cholecystectomy in November 2022 who presented back to the emergency room on 02/27/2021 with nonspecific lower abdominal pain, nausea, and vomiting.  She had a known right inguinal hernia that was not prominent on physical examination.  She was admitted to the hospital for IV hydration and control of her pain and nausea.  As her diet could not be advanced, it was elected to proceed with a right inguinal herniorrhaphy.  She underwent a right inguinal herniorrhaphy, partial small bowel resection on 03/02/2021.  Incarcerated small bowel was noted attached to the hernia sac.  She tolerated the surgery well.  Postoperative course has been unremarkable.  Her hypophosphatemia and hypomagnesemia have been addressed.  She is being discharged home on 03/05/2021 in good and improving condition.   Treatments: surgery: Right inguinal herniorrhaphy, partial small bowel resection on 03/02/2021.  Discharge Exam: Blood pressure (!) 113/58, pulse (!) 107, temperature 98.2 F (36.8 C), temperature source Oral, resp. rate 19, height 4\' 10"  (1.473 m), weight 61.2 kg, last menstrual period 03/11/2008, SpO2 96 %. General appearance: alert, cooperative, and no distress Resp: clear to auscultation bilaterally Cardio: regular rate and rhythm, S1, S2 normal, no murmur, click, rub or gallop GI: Soft, incision healing well.   Nondistended.  Disposition: Discharge disposition: 01-Home or Self Care       Discharge Instructions     Diet - low sodium heart healthy   Complete by: As directed    Increase activity slowly   Complete by: As directed       Allergies as of 03/05/2021   No Known Allergies      Medication List     TAKE these medications    docusate sodium 100 MG capsule Commonly known as: Colace Take 1 capsule (100 mg total) by mouth 2 (two) times daily.   HYDROcodone-acetaminophen 5-325 MG tablet Commonly known as: Norco Take 1 tablet by mouth every 6 (six) hours as needed for moderate pain.   multivitamin with minerals Tabs tablet Take 1 tablet by mouth daily.   ondansetron 4 MG tablet Commonly known as: ZOFRAN Take 1 tablet (4 mg total) by mouth every 8 (eight) hours as needed for nausea or vomiting.   pantoprazole 40 MG tablet Commonly known as: PROTONIX Take 1 tablet (40 mg total) by mouth daily. Tome una tableta por boca diaria        Follow-up Information     03/07/2021, MD. Schedule an appointment as soon as possible for a visit on 03/13/2021.   Specialty: General Surgery Contact information: [id number] [place] [phone]                 Signed: [phone] 03/05/2021, 11:16 AM

## 2021-03-05 NOTE — Progress Notes (Signed)
Nsg Discharge Note  Admit Date:  02/27/2021 Discharge date: 03/05/2021   [name] to be D/C'd Home per MD order.  AVS completed.  Copy for chart, and copy for patient signed, and dated. Patient/caregiver able to verbalize understanding.  Discharge Medication: Allergies as of 03/05/2021   No Known Allergies      Medication List     TAKE these medications    docusate sodium 100 MG capsule Commonly known as: Colace Take 1 capsule (100 mg total) by mouth 2 (two) times daily.   HYDROcodone-acetaminophen 5-325 MG tablet Commonly known as: Norco Take 1 tablet by mouth every 6 (six) hours as needed for moderate pain.   multivitamin with minerals Tabs tablet Take 1 tablet by mouth daily.   ondansetron 4 MG tablet Commonly known as: ZOFRAN Take 1 tablet (4 mg total) by mouth every 8 (eight) hours as needed for nausea or vomiting.   pantoprazole 40 MG tablet Commonly known as: PROTONIX Take 1 tablet (40 mg total) by mouth daily. Tome una tableta por boca diaria        Discharge Assessment: Vitals:   03/04/21 2101 03/05/21 0413  BP: (!) 100/41 (!) 113/58  Pulse: 100 (!) 107  Resp: 20 19  Temp: 99.4 F (37.4 C) 98.2 F (36.8 C)  SpO2: 99% 96%   Skin clean, dry and intact without evidence of skin break down, no evidence of skin tears noted. IV catheter discontinued intact. Site without signs and symptoms of complications - no redness or edema noted at insertion site, patient denies c/o pain - only slight tenderness at site.  Dressing with slight pressure applied.  D/c Instructions-Education: Discharge instructions given to patient/family with verbalized understanding. D/c education completed with patient/family including follow up instructions, medication list, d/c activities limitations if indicated, with other d/c instructions as indicated by MD - patient able to verbalize understanding, all questions fully answered. Patient instructed to return to ED,  call 911, or call MD for any changes in condition.  Patient escorted via WC, and D/C home via private auto.   Demetrio Lapping, LPN [id number]/81/2751 70:01 PM

## 2021-03-06 ENCOUNTER — Ambulatory Visit: Payer: Self-pay | Admitting: General Surgery

## 2021-03-06 ENCOUNTER — Encounter (HOSPITAL_COMMUNITY): Payer: Self-pay | Admitting: General Surgery

## 2021-03-06 LAB — SURGICAL PATHOLOGY

## 2021-03-10 ENCOUNTER — Emergency Department (HOSPITAL_COMMUNITY): Payer: 59

## 2021-03-10 ENCOUNTER — Encounter (HOSPITAL_COMMUNITY): Payer: Self-pay | Admitting: *Deleted

## 2021-03-10 ENCOUNTER — Inpatient Hospital Stay (HOSPITAL_COMMUNITY)
Admission: EM | Admit: 2021-03-10 | Discharge: 2021-03-14 | DRG: 863 | Disposition: A | Discharge status: Home / Self Care | Payer: 59 | Attending: General Surgery | Admitting: General Surgery

## 2021-03-10 DIAGNOSIS — L02211 Cutaneous abscess of abdominal wall: Secondary | ICD-10-CM | POA: Diagnosis present

## 2021-03-10 DIAGNOSIS — Z20822 Contact with and (suspected) exposure to covid-19: Secondary | ICD-10-CM | POA: Diagnosis not present

## 2021-03-10 DIAGNOSIS — K219 Gastro-esophageal reflux disease without esophagitis: Secondary | ICD-10-CM | POA: Diagnosis present

## 2021-03-10 DIAGNOSIS — Z9049 Acquired absence of other specified parts of digestive tract: Secondary | ICD-10-CM | POA: Diagnosis not present

## 2021-03-10 DIAGNOSIS — A419 Sepsis, unspecified organism: Secondary | ICD-10-CM

## 2021-03-10 DIAGNOSIS — T8141XA Infection following a procedure, superficial incisional surgical site, initial encounter: Secondary | ICD-10-CM | POA: Diagnosis not present

## 2021-03-10 DIAGNOSIS — B9620 Unspecified Escherichia coli [E. coli] as the cause of diseases classified elsewhere: Secondary | ICD-10-CM | POA: Diagnosis not present

## 2021-03-10 DIAGNOSIS — Y838 Other surgical procedures as the cause of abnormal reaction of the patient, or of later complication, without mention of misadventure at the time of the procedure: Secondary | ICD-10-CM | POA: Diagnosis not present

## 2021-03-10 DIAGNOSIS — T8149XA Infection following a procedure, other surgical site, initial encounter: Secondary | ICD-10-CM | POA: Diagnosis present

## 2021-03-10 DIAGNOSIS — Z79899 Other long term (current) drug therapy: Secondary | ICD-10-CM | POA: Diagnosis not present

## 2021-03-10 DIAGNOSIS — R109 Unspecified abdominal pain: Secondary | ICD-10-CM | POA: Diagnosis not present

## 2021-03-10 LAB — CBC WITH DIFFERENTIAL/PLATELET
Abs Immature Granulocytes: 0.03 10*3/uL (ref 0.00–0.07)
Basophils Absolute: 0 10*3/uL (ref 0.0–0.1)
Basophils Relative: 0 %
Eosinophils Absolute: 0 10*3/uL (ref 0.0–0.5)
Eosinophils Relative: 0 %
HCT: 28.7 % — ABNORMAL LOW (ref 36.0–46.0)
Hemoglobin: 9.7 g/dL — ABNORMAL LOW (ref 12.0–15.0)
Immature Granulocytes: 1 %
Lymphocytes Relative: 14 %
Lymphs Abs: 0.8 10*3/uL (ref 0.7–4.0)
MCH: 29.7 pg (ref 26.0–34.0)
MCHC: 33.8 g/dL (ref 30.0–36.0)
MCV: 87.8 fL (ref 80.0–100.0)
Monocytes Absolute: 0.5 10*3/uL (ref 0.1–1.0)
Monocytes Relative: 9 %
Neutro Abs: 4.3 10*3/uL (ref 1.7–7.7)
Neutrophils Relative %: 76 %
Platelets: 241 10*3/uL (ref 150–400)
RBC: 3.27 MIL/uL — ABNORMAL LOW (ref 3.87–5.11)
RDW: 14.1 % (ref 11.5–15.5)
WBC: 5.7 10*3/uL (ref 4.0–10.5)
nRBC: 0 % (ref 0.0–0.2)

## 2021-03-10 LAB — PROTIME-INR
INR: 1.3 — ABNORMAL HIGH (ref 0.8–1.2)
Prothrombin Time: 15.8 seconds — ABNORMAL HIGH (ref 11.4–15.2)

## 2021-03-10 LAB — RESP PANEL BY RT-PCR (FLU A&B, COVID) ARPGX2
Influenza A by PCR: NEGATIVE
Influenza B by PCR: NEGATIVE
SARS Coronavirus 2 by RT PCR: NEGATIVE

## 2021-03-10 LAB — COMPREHENSIVE METABOLIC PANEL
ALT: 36 U/L (ref 0–44)
AST: 34 U/L (ref 15–41)
Albumin: 2 g/dL — ABNORMAL LOW (ref 3.5–5.0)
Alkaline Phosphatase: 109 U/L (ref 38–126)
Anion gap: 9 (ref 5–15)
BUN: 5 mg/dL — ABNORMAL LOW (ref 8–23)
CO2: 24 mmol/L (ref 22–32)
Calcium: 7.4 mg/dL — ABNORMAL LOW (ref 8.9–10.3)
Chloride: 101 mmol/L (ref 98–111)
Creatinine, Ser: <0.3 mg/dL — ABNORMAL LOW (ref 0.44–1.00)
Glucose, Bld: 99 mg/dL (ref 70–99)
Potassium: 3.5 mmol/L (ref 3.5–5.1)
Sodium: 134 mmol/L — ABNORMAL LOW (ref 135–145)
Total Bilirubin: 0.4 mg/dL (ref 0.3–1.2)
Total Protein: 5.8 g/dL — ABNORMAL LOW (ref 6.5–8.1)

## 2021-03-10 LAB — LACTIC ACID, PLASMA
Lactic Acid, Venous: 1.1 mmol/L (ref 0.5–1.9)
Lactic Acid, Venous: 1.1 mmol/L (ref 0.5–1.9)

## 2021-03-10 LAB — APTT: aPTT: 36 seconds (ref 24–36)

## 2021-03-10 MED ORDER — ENOXAPARIN SODIUM 40 MG/0.4ML IJ SOSY
40.0000 mg | PREFILLED_SYRINGE | INTRAMUSCULAR | Status: DC
  Administered 2021-03-10 – 2021-03-13 (×4): 40 mg via SUBCUTANEOUS
  Filled 2021-03-10 (×4): qty 0.4

## 2021-03-10 MED ORDER — SODIUM CHLORIDE 0.9 % IV SOLN: INTRAVENOUS | Status: DC

## 2021-03-10 MED ORDER — SODIUM CHLORIDE 0.9 % IV SOLN
2.0000 g | Freq: Once | INTRAVENOUS | Status: AC
  Administered 2021-03-10: 2 g via INTRAVENOUS
  Filled 2021-03-10: qty 20

## 2021-03-10 MED ORDER — PANTOPRAZOLE SODIUM 40 MG PO TBEC
40.0000 mg | DELAYED_RELEASE_TABLET | Freq: Every day | ORAL | Status: DC
  Administered 2021-03-11 – 2021-03-14 (×4): 40 mg via ORAL
  Filled 2021-03-10 (×4): qty 1

## 2021-03-10 MED ORDER — ACETAMINOPHEN 325 MG PO TABS: 650.0000 mg | ORAL_TABLET | Freq: Four times a day (QID) | ORAL | Status: DC | PRN

## 2021-03-10 MED ORDER — ACETAMINOPHEN 650 MG RE SUPP: 650.0000 mg | Freq: Four times a day (QID) | RECTAL | Status: DC | PRN

## 2021-03-10 MED ORDER — DIPHENHYDRAMINE HCL 12.5 MG/5ML PO ELIX: 12.5000 mg | ORAL_SOLUTION | Freq: Four times a day (QID) | ORAL | Status: DC | PRN

## 2021-03-10 MED ORDER — LACTATED RINGERS IV SOLN: INTRAVENOUS | Status: DC

## 2021-03-10 MED ORDER — PIPERACILLIN-TAZOBACTAM 3.375 G IVPB
3.3750 g | Freq: Three times a day (TID) | INTRAVENOUS | Status: DC
  Administered 2021-03-10 – 2021-03-13 (×8): 3.375 g via INTRAVENOUS
  Filled 2021-03-10 (×8): qty 50

## 2021-03-10 MED ORDER — ONDANSETRON 4 MG PO TBDP: 4.0000 mg | ORAL_TABLET | Freq: Four times a day (QID) | ORAL | Status: DC | PRN

## 2021-03-10 MED ORDER — HYDROMORPHONE HCL 1 MG/ML IJ SOLN
1.0000 mg | INTRAMUSCULAR | Status: DC | PRN
  Administered 2021-03-11 – 2021-03-13 (×6): 1 mg via INTRAVENOUS
  Filled 2021-03-10 (×7): qty 1

## 2021-03-10 MED ORDER — ONDANSETRON HCL 4 MG/2ML IJ SOLN: 4.0000 mg | Freq: Four times a day (QID) | INTRAMUSCULAR | Status: DC | PRN

## 2021-03-10 MED ORDER — VANCOMYCIN HCL IN DEXTROSE 1-5 GM/200ML-% IV SOLN
1000.0000 mg | Freq: Once | INTRAVENOUS | Status: AC
  Administered 2021-03-10: 1000 mg via INTRAVENOUS
  Filled 2021-03-10: qty 200

## 2021-03-10 MED ORDER — SIMETHICONE 80 MG PO CHEW: 40.0000 mg | CHEWABLE_TABLET | Freq: Four times a day (QID) | ORAL | Status: DC | PRN

## 2021-03-10 MED ORDER — METRONIDAZOLE 500 MG/100ML IV SOLN
500.0000 mg | Freq: Once | INTRAVENOUS | Status: AC
  Administered 2021-03-10: 500 mg via INTRAVENOUS
  Filled 2021-03-10: qty 100

## 2021-03-10 MED ORDER — DIPHENHYDRAMINE HCL 50 MG/ML IJ SOLN: 12.5000 mg | Freq: Four times a day (QID) | INTRAMUSCULAR | Status: DC | PRN

## 2021-03-10 MED ORDER — VANCOMYCIN HCL 500 MG/100ML IV SOLN
500.0000 mg | Freq: Two times a day (BID) | INTRAVENOUS | Status: DC
  Administered 2021-03-11 – 2021-03-13 (×5): 500 mg via INTRAVENOUS
  Filled 2021-03-10 (×8): qty 100

## 2021-03-10 MED ORDER — ALUM & MAG HYDROXIDE-SIMETH 200-200-20 MG/5ML PO SUSP: 30.0000 mL | ORAL | Status: DC | PRN

## 2021-03-10 MED ORDER — HYDROCODONE-ACETAMINOPHEN 5-325 MG PO TABS
1.0000 | ORAL_TABLET | ORAL | Status: DC | PRN
  Administered 2021-03-10 – 2021-03-14 (×5): 2 via ORAL
  Filled 2021-03-10 (×5): qty 2

## 2021-03-10 MED ORDER — IOHEXOL 300 MG/ML  SOLN
100.0000 mL | Freq: Once | INTRAMUSCULAR | Status: AC | PRN
  Administered 2021-03-10: 100 mL via INTRAVENOUS

## 2021-03-10 NOTE — H&P (Signed)
Janice Wells is an 60 y.o. female.   Chief Complaint: Redness and drainage at wound HPI: Patient is a 60 year old Hispanic female status post right inguinal herniorrhaphy with partial small bowel resection on 03/02/2021 due to an incarcerated right inguinal hernia.  She is status post a laparoscopic cholecystectomy on 02/03/2021.  She was discharged home on 03/05/2021 in good condition.  The family states that she started having some redness and drainage recently and they brought her back to the emergency room.  She does have pain at the site.  She denies any nausea or vomiting.  Her bowel movements have been regular.  Past Medical History:  Diagnosis Date   Acute cholecystitis 02/02/2021   GERD without esophagitis 11/27/2020   Obesity, unspecified 02/15/2015    Past Surgical History:  Procedure Laterality Date   BOWEL RESECTION N/A 03/02/2021   Procedure: PARTIAL SMALL BOWEL RESECTION;  Surgeon: Janice Macho, MD;  Location: [facility];  Service: General;  Laterality: N/A;   CHOLECYSTECTOMY N/A 02/03/2021   Procedure: LAPAROSCOPIC CHOLECYSTECTOMY;  Surgeon: Janice Macho, MD;  Location: [facility];  Service: General;  Laterality: N/A;   INGUINAL HERNIA REPAIR Right 03/02/2021   Procedure: HERNIA REPAIR INGUINAL ADULT;  Surgeon: Janice Macho, MD;  Location: [facility];  Service: General;  Laterality: Right;    Family History  Problem Relation Age of Onset   Diabetes Sister    Cancer Brother    Social History:  reports that she has never smoked. She has never used smokeless tobacco. She reports that she does not drink alcohol and does not use drugs.  Allergies: No Known Allergies  (Not in a hospital admission)   Results for orders placed or performed during the hospital encounter of 03/10/21 (from the past 48 hour(s))  Blood Culture (routine x 2)     Status: None (Preliminary result)   Collection Time: 03/10/21  4:07 PM   Specimen: Blood  Result Value Ref Range   Specimen  Description      LEFT ANTECUBITAL BOTTLES DRAWN AEROBIC AND ANAEROBIC   Special Requests      Blood Culture adequate volume Performed at [facility], [address], [place], [place] [address]    Culture PENDING    Report Status PENDING   Lactic acid, plasma     Status: None   Collection Time: 03/10/21  4:08 PM  Result Value Ref Range   Lactic Acid, Venous 1.1 0.5 - 1.9 mmol/L    Comment: Performed at [facility], [address], [place], [place] [address]  Comprehensive metabolic panel     Status: Abnormal   Collection Time: 03/10/21  4:08 PM  Result Value Ref Range   Sodium 134 (L) 135 - 145 mmol/L   Potassium 3.5 3.5 - 5.1 mmol/L   Chloride 101 98 - 111 mmol/L   CO2 24 22 - 32 mmol/L   Glucose, Bld 99 70 - 99 mg/dL    Comment: Glucose reference range applies only to samples taken after fasting for at least 8 hours.   BUN 5 (L) 8 - 23 mg/dL   Creatinine, Ser <0.35 (L) 0.44 - 1.00 mg/dL   Calcium 7.4 (L) 8.9 - 10.3 mg/dL   Total Protein 5.8 (L) 6.5 - 8.1 g/dL   Albumin 2.0 (L) 3.5 - 5.0 g/dL   AST 34 15 - 41 U/L   ALT 36 0 - 44 U/L   Alkaline Phosphatase 109 38 - 126 U/L   Total Bilirubin 0.4 0.3 - 1.2 mg/dL  GFR, Estimated NOT CALCULATED >60 mL/min    Comment: (NOTE) Calculated using the CKD-EPI Creatinine Equation (2021)    Anion gap 9 5 - 15    Comment: Performed at [facility], [address], [place], [place] [address]  CBC WITH DIFFERENTIAL     Status: Abnormal   Collection Time: 03/10/21  4:08 PM  Result Value Ref Range   WBC 5.7 4.0 - 10.5 K/uL   RBC 3.27 (L) 3.87 - 5.11 MIL/uL   Hemoglobin 9.7 (L) 12.0 - 15.0 g/dL   HCT 28.4 (L) 13.2 - 44.0 %   MCV 87.8 80.0 - 100.0 fL   MCH 29.7 26.0 - 34.0 pg   MCHC 33.8 30.0 - 36.0 g/dL   RDW 10.2 72.5 - 36.6 %   Platelets 241 150 - 400 K/uL   nRBC 0.0 0.0 - 0.2 %   Neutrophils Relative % 76 %   Neutro Abs 4.3 1.7 - 7.7 K/uL   Lymphocytes Relative 14 %   Lymphs Abs 0.8 0.7 - 4.0 K/uL   Monocytes Relative 9 %    Monocytes Absolute 0.5 0.1 - 1.0 K/uL   Eosinophils Relative 0 %   Eosinophils Absolute 0.0 0.0 - 0.5 K/uL   Basophils Relative 0 %   Basophils Absolute 0.0 0.0 - 0.1 K/uL   Immature Granulocytes 1 %   Abs Immature Granulocytes 0.03 0.00 - 0.07 K/uL    Comment: Performed at [facility], [address] [place], [place] [address]  Protime-INR     Status: Abnormal   Collection Time: 03/10/21  4:08 PM  Result Value Ref Range   Prothrombin Time 15.8 (H) 11.4 - 15.2 seconds   INR 1.3 (H) 0.8 - 1.2    Comment: (NOTE) INR goal varies based on device and disease states. Performed at [facility], [address], [place], [place] [address]   APTT     Status: None   Collection Time: 03/10/21  4:08 PM  Result Value Ref Range   aPTT 36 24 - 36 seconds    Comment: Performed at [facility], [address], [place], [place] [address]  Blood Culture (routine x 2)     Status: None (Preliminary result)   Collection Time: 03/10/21  4:14 PM   Specimen: Right Antecubital; Blood  Result Value Ref Range   Specimen Description      RIGHT ANTECUBITAL BOTTLES DRAWN AEROBIC AND ANAEROBIC   Special Requests      Blood Culture adequate volume Performed at [facility], [address] [place], [place] [address]    Culture PENDING    Report Status PENDING   Resp Panel by RT-PCR (Flu A&B, Covid) Nasopharyngeal Swab     Status: None   Collection Time: 03/10/21  4:19 PM   Specimen: Nasopharyngeal Swab; Nasopharyngeal(NP) swabs in vial transport medium  Result Value Ref Range   SARS Coronavirus 2 by RT PCR NEGATIVE NEGATIVE    Comment: (NOTE) SARS-CoV-2 target nucleic acids are NOT DETECTED.  The SARS-CoV-2 RNA is generally detectable in upper respiratory specimens during the acute phase of infection. The lowest concentration of SARS-CoV-2 viral copies this assay can detect is 138 copies/mL. A negative result does not preclude SARS-Cov-2 infection and should not be used as the sole basis for treatment  or other patient management decisions. A negative result may occur with  improper specimen collection/handling, submission of specimen other than nasopharyngeal swab, presence of viral mutation(s) within the areas targeted by this assay, and inadequate number of  viral copies(<138 copies/mL). A negative result must be combined with clinical observations, patient history, and epidemiological information. The expected result is Negative.  Fact Sheet for Patients:  BloggerCourse.com  Fact Sheet for Healthcare Providers:  SeriousBroker.it  This test is no t yet approved or cleared by the [place] FDA and  has been authorized for detection and/or diagnosis of SARS-CoV-2 by FDA under an Emergency Use Authorization (EUA). This EUA will remain  in effect (meaning this test can be used) for the duration of the COVID-19 declaration under Section 564(b)(1) of the Act, 21 U.S.C.section 360bbb-3(b)(1), unless the authorization is terminated  or revoked sooner.       Influenza A by PCR NEGATIVE NEGATIVE   Influenza B by PCR NEGATIVE NEGATIVE    Comment: (NOTE) The Xpert Xpress SARS-CoV-2/FLU/RSV plus assay is intended as an aid in the diagnosis of influenza from Nasopharyngeal swab specimens and should not be used as a sole basis for treatment. Nasal washings and aspirates are unacceptable for Xpert Xpress SARS-CoV-2/FLU/RSV testing.  Fact Sheet for Patients: BloggerCourse.com  Fact Sheet for Healthcare Providers: SeriousBroker.it  This test is not yet approved or cleared by the [place] FDA and has been authorized for detection and/or diagnosis of SARS-CoV-2 by FDA under an Emergency Use Authorization (EUA). This EUA will remain in effect (meaning this test can be used) for the duration of the COVID-19 declaration under Section 564(b)(1) of the Act, 21 U.S.C. section  360bbb-3(b)(1), unless the authorization is terminated or revoked.  Performed at [facility], [address], [place], [place] [address]    CT ABDOMEN PELVIS W CONTRAST  Result Date: 03/10/2021 CLINICAL DATA:  Postop. On fluid collection. Abdominal pain. Recent hernia surgery. EXAM: CT ABDOMEN AND PELVIS WITH CONTRAST TECHNIQUE: Multidetector CT imaging of the abdomen and pelvis was performed using the standard protocol following bolus administration of intravenous contrast. CONTRAST:  OMNIPAQUE IOHEXOL 300 MG/ML  SOLN COMPARISON:  02/15/2021. FINDINGS: Lower chest: Trace left effusion. Mild dependent lower lobe atelectasis. Hepatobiliary: Liver normal size. Stable small low-density lesion adjacent to the falciform ligament. No liver abnormalities. Status post cholecystectomy. No bile duct dilation. Pancreas: Unremarkable. No pancreatic ductal dilatation or surrounding inflammatory changes. Spleen: Normal in size without focal abnormality. Adrenals/Urinary Tract: Adrenal glands are unremarkable. Kidneys are normal, without renal calculi, focal lesion, or hydronephrosis. Bladder is unremarkable. Stomach/Bowel: New small bowel anastomosis in the right lower quadrant. Adjacent small bowel loops show mild wall thickening. There is a contiguous collection in the anterior, right pelvis, measuring 6 x 3.1 x 5.8 cm. Remainder the small bowel is unremarkable, normal in caliber with no other areas of wall thickening. Colon is normal in caliber with no wall thickening or adjacent inflammation. Vascular/Lymphatic: Aortic atherosclerosis. No aneurysm. No enlarged lymph nodes. Reproductive: Uterus and bilateral adnexa are unremarkable. Other: Fluid collection in the subcutaneous soft tissues of the right lower quadrant, along the right lower quadrant incision. Collection has ill-defined margins. It measures approximately, 8.5 x 4 x 5.2 cm. There is adjacent inflammatory stranding. This collection may communicate  with the adjacent collection anterior right pelvic peritoneal cavity. Musculoskeletal: No fracture or acute finding.  No bone lesion. IMPRESSION: 1. Two adjacent postop collections, 1 in the subcutaneous soft tissues of the right lower quadrant along the incision, 8.5 x 4 x 5.2 cm in size, the other below this, in the right anterior pelvis peritoneal cavity measuring 6 x 3.1 x 5.8 cm. There associated inflammatory changes mostly in right lower quadrant anterior  abdominal wall fat. 2. Small bowel anastomosis in the right lower quadrant adjacent to the pelvic collection, associated with mild small bowel wall thickening. 3. No evidence of bowel obstruction.  No free intraperitoneal air. Electronically Signed   By: Amie Portland M.D.   On: 03/10/2021 17:39   DG Chest Port 1 View  Result Date: 03/10/2021 CLINICAL DATA:  Questionable sepsis. EXAM: PORTABLE CHEST 1 VIEW COMPARISON:  Chest x-ray with ribs 10/07/2011. FINDINGS: There is a band of opacity in the right lower lung. The lungs are otherwise clear. There is no pleural effusion or pneumothorax. The cardiomediastinal silhouette is within normal limits. No acute fractures are seen. There are surgical clips in the right abdomen. IMPRESSION: 1. Band of opacity in the right lower lung favored is atelectasis. Infection can not be entirely excluded. Followup PA and lateral chest X-ray is recommended in 3-4 weeks following trial of antibiotic therapy to ensure resolution and exclude underlying malignancy. Electronically Signed   By: Darliss Cheney M.D.   On: 03/10/2021 16:56    Review of Systems  Constitutional:  Positive for fatigue.  HENT: Negative.    Eyes: Negative.   Respiratory: Negative.    Cardiovascular: Negative.   Gastrointestinal:  Positive for abdominal pain.  Endocrine: Negative.   Genitourinary: Negative.   Musculoskeletal: Negative.   Allergic/Immunologic: Negative.   Neurological: Negative.   Hematological: Negative.    Psychiatric/Behavioral: Negative.     Blood pressure 112/75, pulse (!) 101, temperature 97.6 F (36.4 C), temperature source Oral, resp. rate (!) 22, last menstrual period 03/11/2008, SpO2 97 %. Physical Exam Vitals reviewed.  Constitutional:      Appearance: Normal appearance. She is not ill-appearing.  HENT:     Head: Normocephalic and atraumatic.  Cardiovascular:     Rate and Rhythm: Normal rate and regular rhythm.     Heart sounds: Normal heart sounds. No murmur heard.   No friction rub. No gallop.  Pulmonary:     Effort: Pulmonary effort is normal. No respiratory distress.     Breath sounds: Normal breath sounds. No stridor. No wheezing, rhonchi or rales.  Abdominal:     General: Bowel sounds are normal. There is no distension.     Palpations: Abdomen is soft. There is no mass.     Tenderness: There is abdominal tenderness. There is no guarding or rebound.     Hernia: No hernia is present.     Comments: The right lower quadrant wound is erythematous and red.  Staples removed.  Foul-smelling purulent drainage noted.  Cultures taken.  This did extend down to the abdominal wall.  There was also some fluid emanating from the lateral aspect of the wound.  The wound was packed with normal saline wet-to-dry gauze.  No succus entericus noted.  Skin:    General: Skin is warm and dry.  Neurological:     Mental Status: She is alert and oriented to person, place, and time.     Assessment/Plan Impression: Postoperative wound infection, most likely secondary to infected hematoma.  I suspect the deeper wound is in continuity with the abdominal wall.  There does not appear to be an anastomotic leak.  It is reassuring that her white blood cell count is normal. Plan: Will admit the patient in the hospital for wound care and IV antibiotics.  Vancomycin and Zosyn have been ordered pending culture results.  This was explained to the patient and son through an interpreter.  Janice Macho,  MD 03/10/2021, 6:24 PM

## 2021-03-10 NOTE — ED Provider Notes (Signed)
[facility] EMERGENCY DEPARTMENT Provider Note   CSN: [id number] Arrival date & time: 03/10/21  1444     History Chief Complaint  Patient presents with   Wound Infection    Janice Wells is a 60 y.o. female.  HPI  This patient is a Spanish-speaking 60 year old female presenting after having abdominal surgery on December 23.  The patient underwent multiple different surgical aspects of the procedure including a hernia repair, she had some incarcerated bowel and she had a cholecystectomy.  She presents now approximately 1 week later with increasing abdominal pain at her surgical site and a foul-smelling purulent drainage that is coming from the wound.  She denies fevers chills nausea or vomiting, she is continue to eat, the abdominal pain is getting worse and has now become severe.  She has not had any medicines prior to arrival.  She is unsure of the name of her medications but review of the prior discharge summary shows that she has been prescribed hydrocodone, stool softeners and proton pump inhibitors.  Past Medical History:  Diagnosis Date   Acute cholecystitis 02/02/2021   GERD without esophagitis 11/27/2020   Obesity, unspecified 02/15/2015    Patient Active Problem List   Diagnosis Date Noted   Incarcerated right inguinal hernia    Ileus following gastrointestinal surgery (HCC) 02/27/2021   Oropharyngeal dysphagia    Constipation 02/16/2021   Non-English speaking patient 02/16/2021   Right inguinal hernia    Generalized abdominal pain 02/07/2021   Hypokalemia 02/07/2021   Intractable nausea and vomiting 02/07/2021   SBO (small bowel obstruction) (HCC) 02/06/2021   GERD without esophagitis 11/27/2020   Obesity, unspecified 02/15/2015    Past Surgical History:  Procedure Laterality Date   BOWEL RESECTION N/A 03/02/2021   Procedure: PARTIAL SMALL BOWEL RESECTION;  Surgeon: Franky Macho, MD;  Location: [facility];  Service: General;  Laterality: N/A;    CHOLECYSTECTOMY N/A 02/03/2021   Procedure: LAPAROSCOPIC CHOLECYSTECTOMY;  Surgeon: Franky Macho, MD;  Location: [facility];  Service: General;  Laterality: N/A;   INGUINAL HERNIA REPAIR Right 03/02/2021   Procedure: HERNIA REPAIR INGUINAL ADULT;  Surgeon: Franky Macho, MD;  Location: [facility];  Service: General;  Laterality: Right;     OB History     Gravida  0   Para  0   Term  0   Preterm  0   AB  0   Living         SAB  0   IAB  0   Ectopic  0   Multiple      Live Births              Family History  Problem Relation Age of Onset   Diabetes Sister    Cancer Brother     Social History   Tobacco Use   Smoking status: Never   Smokeless tobacco: Never  Substance Use Topics   Alcohol use: No   Drug use: No    Home Medications Prior to Admission medications   Medication Sig Start Date End Date Taking? Authorizing Provider  docusate sodium (COLACE) 100 MG capsule Take 1 capsule (100 mg total) by mouth 2 (two) times daily. 02/19/21 02/19/22  Cleora Fleet, MD  HYDROcodone-acetaminophen (NORCO) 5-325 MG tablet Take 1 tablet by mouth every 6 (six) hours as needed for moderate pain. 03/05/21   Franky Macho, MD  Multiple Vitamin (MULTIVITAMIN WITH MINERALS) TABS tablet Take 1 tablet by mouth daily. 02/20/21   Cleora Fleet, MD  ondansetron (ZOFRAN) 4 MG tablet Take 1 tablet (4 mg total) by mouth every 8 (eight) hours as needed for nausea or vomiting. 02/19/21   Johnson, Clanford L, MD  pantoprazole (PROTONIX) 40 MG tablet Take 1 tablet (40 mg total) by mouth daily. Tome una tableta por boca diaria 02/09/21   Franky Macho, MD    Allergies    Patient has no known allergies.  Review of Systems   Review of Systems  All other systems reviewed and are negative.  Physical Exam Updated Vital Signs BP 112/75    Pulse (!) 101    Temp 97.6 F (36.4 C) (Oral)    Resp (!) 22    LMP 03/11/2008 (Approximate)    SpO2 97%   Physical Exam Vitals and nursing  note reviewed.  Constitutional:      General: She is not in acute distress.    Appearance: She is well-developed.  HENT:     Head: Normocephalic and atraumatic.     Mouth/Throat:     Pharynx: No oropharyngeal exudate.  Eyes:     General: No scleral icterus.       Right eye: No discharge.        Left eye: No discharge.     Conjunctiva/sclera: Conjunctivae normal.     Pupils: Pupils are equal, round, and reactive to light.  Neck:     Thyroid: No thyromegaly.     Vascular: No JVD.  Cardiovascular:     Rate and Rhythm: Regular rhythm. Tachycardia present.     Heart sounds: Normal heart sounds. No murmur heard.   No friction rub. No gallop.  Pulmonary:     Effort: Pulmonary effort is normal. No respiratory distress.     Breath sounds: Normal breath sounds. No wheezing or rales.  Abdominal:     General: Bowel sounds are normal. There is no distension.     Palpations: Abdomen is soft. There is no mass.     Tenderness: There is abdominal tenderness.     Comments: There are some decreased bowel sounds, she has tenderness in her lower and right lower abdomen, there is foul-smelling purulent material that is pouring from her surgical site, the staples appear to be intact, there is minimal surrounding redness of the wound  Musculoskeletal:        General: No tenderness. Normal range of motion.     Cervical back: Normal range of motion and neck supple.     Right lower leg: No edema.     Left lower leg: No edema.  Lymphadenopathy:     Cervical: No cervical adenopathy.  Skin:    General: Skin is warm and dry.     Findings: No erythema or rash.  Neurological:     Mental Status: She is alert.     Coordination: Coordination normal.  Psychiatric:        Behavior: Behavior normal.    ED Results / Procedures / Treatments   Labs (all labs ordered are listed, but only abnormal results are displayed) Labs Reviewed  COMPREHENSIVE METABOLIC PANEL - Abnormal; Notable for the following  components:      Result Value   Sodium 134 (*)    BUN 5 (*)    Creatinine, Ser <0.30 (*)    Calcium 7.4 (*)    Total Protein 5.8 (*)    Albumin 2.0 (*)    All other components within normal limits  CBC WITH DIFFERENTIAL/PLATELET - Abnormal; Notable for the following components:   RBC  3.27 (*)    Hemoglobin 9.7 (*)    HCT 28.7 (*)    All other components within normal limits  PROTIME-INR - Abnormal; Notable for the following components:   Prothrombin Time 15.8 (*)    INR 1.3 (*)    All other components within normal limits  RESP PANEL BY RT-PCR (FLU A&B, COVID) ARPGX2  CULTURE, BLOOD (ROUTINE X 2)  CULTURE, BLOOD (ROUTINE X 2)  URINE CULTURE  LACTIC ACID, PLASMA  APTT  LACTIC ACID, PLASMA  URINALYSIS, ROUTINE W REFLEX MICROSCOPIC    EKG EKG Interpretation  Date/Time:  Saturday March 10 2021 16:23:11 EST Ventricular Rate:  98 PR Interval:  130 QRS Duration: 84 QT Interval:  321 QTC Calculation: 410 R Axis:   77 Text Interpretation: Sinus rhythm Confirmed by Eber Hong ([id number]) on 03/10/2021 4:39:26 PM  Radiology CT ABDOMEN PELVIS W CONTRAST  Result Date: 03/10/2021 CLINICAL DATA:  Postop. On fluid collection. Abdominal pain. Recent hernia surgery. EXAM: CT ABDOMEN AND PELVIS WITH CONTRAST TECHNIQUE: Multidetector CT imaging of the abdomen and pelvis was performed using the standard protocol following bolus administration of intravenous contrast. CONTRAST:  OMNIPAQUE IOHEXOL 300 MG/ML  SOLN COMPARISON:  02/15/2021. FINDINGS: Lower chest: Trace left effusion. Mild dependent lower lobe atelectasis. Hepatobiliary: Liver normal size. Stable small low-density lesion adjacent to the falciform ligament. No liver abnormalities. Status post cholecystectomy. No bile duct dilation. Pancreas: Unremarkable. No pancreatic ductal dilatation or surrounding inflammatory changes. Spleen: Normal in size without focal abnormality. Adrenals/Urinary Tract: Adrenal glands are  unremarkable. Kidneys are normal, without renal calculi, focal lesion, or hydronephrosis. Bladder is unremarkable. Stomach/Bowel: New small bowel anastomosis in the right lower quadrant. Adjacent small bowel loops show mild wall thickening. There is a contiguous collection in the anterior, right pelvis, measuring 6 x 3.1 x 5.8 cm. Remainder the small bowel is unremarkable, normal in caliber with no other areas of wall thickening. Colon is normal in caliber with no wall thickening or adjacent inflammation. Vascular/Lymphatic: Aortic atherosclerosis. No aneurysm. No enlarged lymph nodes. Reproductive: Uterus and bilateral adnexa are unremarkable. Other: Fluid collection in the subcutaneous soft tissues of the right lower quadrant, along the right lower quadrant incision. Collection has ill-defined margins. It measures approximately, 8.5 x 4 x 5.2 cm. There is adjacent inflammatory stranding. This collection may communicate with the adjacent collection anterior right pelvic peritoneal cavity. Musculoskeletal: No fracture or acute finding.  No bone lesion. IMPRESSION: 1. Two adjacent postop collections, 1 in the subcutaneous soft tissues of the right lower quadrant along the incision, 8.5 x 4 x 5.2 cm in size, the other below this, in the right anterior pelvis peritoneal cavity measuring 6 x 3.1 x 5.8 cm. There associated inflammatory changes mostly in right lower quadrant anterior abdominal wall fat. 2. Small bowel anastomosis in the right lower quadrant adjacent to the pelvic collection, associated with mild small bowel wall thickening. 3. No evidence of bowel obstruction.  No free intraperitoneal air. Electronically Signed   By: Amie Portland M.D.   On: 03/10/2021 17:39   DG Chest Port 1 View  Result Date: 03/10/2021 CLINICAL DATA:  Questionable sepsis. EXAM: PORTABLE CHEST 1 VIEW COMPARISON:  Chest x-ray with ribs 10/07/2011. FINDINGS: There is a band of opacity in the right lower lung. The lungs are otherwise  clear. There is no pleural effusion or pneumothorax. The cardiomediastinal silhouette is within normal limits. No acute fractures are seen. There are surgical clips in the right abdomen. IMPRESSION: 1. Band of opacity  in the right lower lung favored is atelectasis. Infection can not be entirely excluded. Followup PA and lateral chest X-ray is recommended in 3-4 weeks following trial of antibiotic therapy to ensure resolution and exclude underlying malignancy. Electronically Signed   By: Darliss Cheney M.D.   On: 03/10/2021 16:56    Procedures .Critical Care Performed by: Eber Hong, MD Authorized by: Eber Hong, MD   Critical care provider statement:    Critical care time (minutes):  30   Critical care time was exclusive of:  Separately billable procedures and treating other patients and teaching time   Critical care was necessary to treat or prevent imminent or life-threatening deterioration of the following conditions:  Sepsis   Critical care was time spent personally by me on the following activities:  Development of treatment plan with patient or surrogate, discussions with consultants, evaluation of patient's response to treatment, examination of patient, ordering and review of laboratory studies, ordering and review of radiographic studies, ordering and performing treatments and interventions, pulse oximetry, re-evaluation of patient's condition, review of old charts and obtaining history from patient or surrogate   I assumed direction of critical care for this patient from another provider in my specialty: no     Care discussed with: admitting provider   Comments:         Medications Ordered in ED Medications  lactated ringers infusion ( Intravenous New Bag/Given 03/10/21 1625)  metroNIDAZOLE (FLAGYL) IVPB 500 mg (500 mg Intravenous New Bag/Given 03/10/21 1721)  cefTRIAXone (ROCEPHIN) 2 g in sodium chloride 0.9 % 100 mL IVPB (0 g Intravenous Stopped 03/10/21 1721)  iohexol  (OMNIPAQUE) 300 MG/ML solution 100 mL (100 mLs Intravenous Contrast Given 03/10/21 1701)    ED Course  I have reviewed the triage vital signs and the nursing notes.  Pertinent labs & imaging results that were available during my care of the patient were reviewed by me and considered in my medical decision making (see chart for details).    MDM Rules/Calculators/A&P                          This patient presents to the ED for concern of sepsis and infection, this involves an extensive number of treatment options, and is a complaint that carries with it a high risk of complications and morbidity.  The differential diagnosis includes postop abscess, this could be subcutaneous, this could extend to the intra-abdominal cavity.  I am very concerned for sepsis in this patient   Co morbidities that complicate the patient evaluation  Ileus, recent surgery Purulent material, hypotension   Additional history obtained:  Additional history obtained from prior medical record External records from outside source obtained and reviewed including surgical notes and discharge summary   Lab Tests:  I Ordered, and personally interpreted labs.  The pertinent results include:  CBC and CMP - no acute findings -negative for the flu, blood cultures obtained, CBC does show some anemia which is of no concern,   Imaging Studies ordered:  I ordered imaging studies including CT scan of the abdomen and pelvis and portable chest x-ray I independently visualized and interpreted imaging which showed no signs of pneumonia however there are 2 significantly sized fluid collections 1 in the subcutaneous soft tissues as well as one that appears to be in the peritoneal cavity I agree with the radiologist interpretation   Cardiac Monitoring:  The patient was maintained on a cardiac monitor.  I personally viewed and  interpreted the cardiac monitored which showed an underlying rhythm of: Sinus  tachycardia   Medicines ordered and prescription drug management:  I ordered medication including Rocephin and Flagyl for sepsis and antibiotics for infection Reevaluation of the patient after these medicines showed that the patient improved I have reviewed the patients home medicines and have made adjustments as needed   Critical Interventions:  Consultation with surgeon Antibiotics for sepsis IV fluid   Consultations Obtained:  I requested consultation with the general surgery, Dr. Franky Macho,  and discussed lab and imaging findings as well as pertinent plan - they recommend: Admission to the hospital, he will admit the patient to his service and has drained the external abscess at the bedside   Problem List / ED Course:  Sepsis from postoperative abscess Patient was given multiple antibiotics, IV fluids, blood pressure has improved to 114/57   Reevaluation:  After the interventions noted above, I reevaluated the patient and found that they have :improved   Social Determinants of Health:  Non-English speaking   Dispostion:  After consideration of the diagnostic results and the patients response to treatment, I feel that the patent would benefit from admission to the hospital.       Final Clinical Impression(s) / ED Diagnoses Final diagnoses:  Abdominal wall abscess  Sepsis without acute organ dysfunction, due to unspecified organism [facility])    Rx / DC Orders ED Discharge Orders     None        Eber Hong, MD 03/10/21 [id number]

## 2021-03-10 NOTE — Progress Notes (Signed)
Pharmacy Antibiotic Note  Janice Wells is a 60 y.o. female admitted on 03/10/2021 with wound infection.  Pharmacy has been consulted for Vancomycin and Zosyn dosing.  Plan: Vancomycin 1000mg  x 1 then 500mg  IV Q 12 hrs. Goal AUC 400-550. Expected AUC: 522 SCr used: 0.8     Temp (24hrs), Avg:97.6 F (36.4 C), Min:97.6 F (36.4 C), Max:97.6 F (36.4 C)  Recent Labs  Lab 03/04/21 0433 03/05/21 0433 03/10/21 1608  WBC 8.6 7.5 5.7  CREATININE <0.30* <0.30* <0.30*  LATICACIDVEN  --   --  1.1    CrCl cannot be calculated (This lab value cannot be used to calculate CrCl because it is not a number: <0.30).    No Known Allergies  Antimicrobials this admission: Vanc 12/31 >>  Zosyn 12/31 >>   Dose adjustments this admission:   Microbiology results:  BCx:   UCx:    Sputum:    MRSA PCR:   Thank you for allowing pharmacy to be a part of this patients care.  1/32 A 03/10/2021 6:38 PM

## 2021-03-10 NOTE — ED Triage Notes (Addendum)
Pt with recent hernia surgery on 12/23, wound with increase in drainage and foul odor per son.  Denies any fevers. Pt with generalized weakness as well. Burning pain to site.  Pt does not speak English and interpretor used #[id number] Derek Mound.

## 2021-03-10 NOTE — Progress Notes (Signed)
Elink following code sepsis °

## 2021-03-11 ENCOUNTER — Other Ambulatory Visit: Payer: Self-pay

## 2021-03-11 LAB — CBC
HCT: 24.9 % — ABNORMAL LOW (ref 36.0–46.0)
Hemoglobin: 8.4 g/dL — ABNORMAL LOW (ref 12.0–15.0)
MCH: 29.6 pg (ref 26.0–34.0)
MCHC: 33.7 g/dL (ref 30.0–36.0)
MCV: 87.7 fL (ref 80.0–100.0)
Platelets: 246 10*3/uL (ref 150–400)
RBC: 2.84 MIL/uL — ABNORMAL LOW (ref 3.87–5.11)
RDW: 14.2 % (ref 11.5–15.5)
WBC: 6.2 10*3/uL (ref 4.0–10.5)
nRBC: 0 % (ref 0.0–0.2)

## 2021-03-11 LAB — BASIC METABOLIC PANEL
Anion gap: 9 (ref 5–15)
BUN: 6 mg/dL — ABNORMAL LOW (ref 8–23)
CO2: 22 mmol/L (ref 22–32)
Calcium: 7.1 mg/dL — ABNORMAL LOW (ref 8.9–10.3)
Chloride: 103 mmol/L (ref 98–111)
Creatinine, Ser: <0.3 mg/dL — ABNORMAL LOW (ref 0.44–1.00)
Glucose, Bld: 89 mg/dL (ref 70–99)
Potassium: 2.9 mmol/L — ABNORMAL LOW (ref 3.5–5.1)
Sodium: 134 mmol/L — ABNORMAL LOW (ref 135–145)

## 2021-03-11 LAB — PHOSPHORUS: Phosphorus: 3.7 mg/dL (ref 2.5–4.6)

## 2021-03-11 LAB — MAGNESIUM: Magnesium: 1.5 mg/dL — ABNORMAL LOW (ref 1.7–2.4)

## 2021-03-11 MED ORDER — KCL IN DEXTROSE-NACL 40-5-0.45 MEQ/L-%-% IV SOLN: INTRAVENOUS | Status: DC

## 2021-03-11 MED ORDER — MAGNESIUM SULFATE 2 GM/50ML IV SOLN
2.0000 g | Freq: Once | INTRAVENOUS | Status: AC
  Administered 2021-03-11: 2 g via INTRAVENOUS
  Filled 2021-03-11: qty 50

## 2021-03-11 MED ORDER — POTASSIUM CHLORIDE CRYS ER 20 MEQ PO TBCR
40.0000 meq | EXTENDED_RELEASE_TABLET | Freq: Two times a day (BID) | ORAL | Status: DC
  Administered 2021-03-11 – 2021-03-13 (×5): 40 meq via ORAL
  Filled 2021-03-11 (×5): qty 2

## 2021-03-11 NOTE — Progress Notes (Signed)
Subjective: Patient complains of burning sensation in wound, though it is improved.  Objective: Vital signs in last 24 hours: Temp:  [97.6 F (36.4 C)-98.8 F (37.1 C)] 97.7 F (36.5 C) (01/01 0522) Pulse Rate:  [92-116] 92 (01/01 0522) Resp:  [15-36] 18 (01/01 0522) BP: (97-123)/(52-75) 108/62 (01/01 0522) SpO2:  [95 %-100 %] 96 % (01/01 0522) Weight:  [60.8 kg] 60.8 kg (12/31 2043) Last BM Date: 03/10/21  Intake/Output from previous day: 12/31 0701 - 01/01 0700 In: 435.3 [P.O.:120; I.V.:265.3; IV Piggyback:50] Out: -  Intake/Output this shift: Total I/O In: 180 [P.O.:180] Out: -   General appearance: alert, cooperative, and no distress Resp: clear to auscultation bilaterally Cardio: regular rate and rhythm, S1, S2 normal, no murmur, click, rub or gallop GI: Soft, bowel sounds active.  Packing in place without significant drainage present.  Lab Results:  Recent Labs    03/10/21 1608 03/11/21 0223  WBC 5.7 6.2  HGB 9.7* 8.4*  HCT 28.7* 24.9*  PLT 241 246   BMET Recent Labs    03/10/21 1608 03/11/21 0223  NA 134* 134*  K 3.5 2.9*  CL 101 103  CO2 24 22  GLUCOSE 99 89  BUN 5* 6*  CREATININE <0.30* <0.30*  CALCIUM 7.4* 7.1*   PT/INR Recent Labs    03/10/21 1608  LABPROT 15.8*  INR 1.3*    Studies/Results: CT ABDOMEN PELVIS W CONTRAST  Result Date: 03/10/2021 CLINICAL DATA:  Postop. On fluid collection. Abdominal pain. Recent hernia surgery. EXAM: CT ABDOMEN AND PELVIS WITH CONTRAST TECHNIQUE: Multidetector CT imaging of the abdomen and pelvis was performed using the standard protocol following bolus administration of intravenous contrast. CONTRAST:  OMNIPAQUE IOHEXOL 300 MG/ML  SOLN COMPARISON:  02/15/2021. FINDINGS: Lower chest: Trace left effusion. Mild dependent lower lobe atelectasis. Hepatobiliary: Liver normal size. Stable small low-density lesion adjacent to the falciform ligament. No liver abnormalities. Status post cholecystectomy. No  bile duct dilation. Pancreas: Unremarkable. No pancreatic ductal dilatation or surrounding inflammatory changes. Spleen: Normal in size without focal abnormality. Adrenals/Urinary Tract: Adrenal glands are unremarkable. Kidneys are normal, without renal calculi, focal lesion, or hydronephrosis. Bladder is unremarkable. Stomach/Bowel: New small bowel anastomosis in the right lower quadrant. Adjacent small bowel loops show mild wall thickening. There is a contiguous collection in the anterior, right pelvis, measuring 6 x 3.1 x 5.8 cm. Remainder the small bowel is unremarkable, normal in caliber with no other areas of wall thickening. Colon is normal in caliber with no wall thickening or adjacent inflammation. Vascular/Lymphatic: Aortic atherosclerosis. No aneurysm. No enlarged lymph nodes. Reproductive: Uterus and bilateral adnexa are unremarkable. Other: Fluid collection in the subcutaneous soft tissues of the right lower quadrant, along the right lower quadrant incision. Collection has ill-defined margins. It measures approximately, 8.5 x 4 x 5.2 cm. There is adjacent inflammatory stranding. This collection may communicate with the adjacent collection anterior right pelvic peritoneal cavity. Musculoskeletal: No fracture or acute finding.  No bone lesion. IMPRESSION: 1. Two adjacent postop collections, 1 in the subcutaneous soft tissues of the right lower quadrant along the incision, 8.5 x 4 x 5.2 cm in size, the other below this, in the right anterior pelvis peritoneal cavity measuring 6 x 3.1 x 5.8 cm. There associated inflammatory changes mostly in right lower quadrant anterior abdominal wall fat. 2. Small bowel anastomosis in the right lower quadrant adjacent to the pelvic collection, associated with mild small bowel wall thickening. 3. No evidence of bowel obstruction.  No free intraperitoneal air. Electronically  Signed   By: Amie Portland M.D.   On: 03/10/2021 17:39   DG Chest Port 1 View  Result Date:  03/10/2021 CLINICAL DATA:  Questionable sepsis. EXAM: PORTABLE CHEST 1 VIEW COMPARISON:  Chest x-ray with ribs 10/07/2011. FINDINGS: There is a band of opacity in the right lower lung. The lungs are otherwise clear. There is no pleural effusion or pneumothorax. The cardiomediastinal silhouette is within normal limits. No acute fractures are seen. There are surgical clips in the right abdomen. IMPRESSION: 1. Band of opacity in the right lower lung favored is atelectasis. Infection can not be entirely excluded. Followup PA and lateral chest X-ray is recommended in 3-4 weeks following trial of antibiotic therapy to ensure resolution and exclude underlying malignancy. Electronically Signed   By: Darliss Cheney M.D.   On: 03/10/2021 16:56    Anti-infectives: Anti-infectives (From admission, onward)    Start     Dose/Rate Route Frequency Ordered Stop   03/11/21 0600  vancomycin (VANCOREADY) IVPB 500 mg/100 mL        500 mg 100 mL/hr over 60 Minutes Intravenous Every 12 hours 03/10/21 1837     03/10/21 2200  piperacillin-tazobactam (ZOSYN) IVPB 3.375 g        3.375 g 12.5 mL/hr over 240 Minutes Intravenous Every 8 hours 03/10/21 1830     03/10/21 1845  vancomycin (VANCOCIN) IVPB 1000 mg/200 mL premix        1,000 mg 200 mL/hr over 60 Minutes Intravenous  Once 03/10/21 1832 03/10/21 2143   03/10/21 1615  cefTRIAXone (ROCEPHIN) 2 g in sodium chloride 0.9 % 100 mL IVPB        2 g 200 mL/hr over 30 Minutes Intravenous  Once 03/10/21 1601 03/10/21 1721   03/10/21 1615  metroNIDAZOLE (FLAGYL) IVPB 500 mg        500 mg 100 mL/hr over 60 Minutes Intravenous  Once 03/10/21 1601 03/10/21 1827       Assessment/Plan: Impression: Deep wound infection, status post right inguinal herniorrhaphy with partial small bowel resection.  Multiple bacteria present on Gram stain.  Awaiting final ID.  Continue vancomycin and Zosyn.  Normal white blood cell count reassuring.  We will supplement hypomagnesemia and  hypokalemia.  LOS: 1 day    Franky Macho 03/11/2021

## 2021-03-11 NOTE — Progress Notes (Signed)
Patient dressing changed per order, tolerated well. Redness around mid lower abdomen with foul odor from lower incision site.  Patient's potassium 2.9, MD notified.

## 2021-03-12 LAB — CBC
HCT: 24.2 % — ABNORMAL LOW (ref 36.0–46.0)
Hemoglobin: 8 g/dL — ABNORMAL LOW (ref 12.0–15.0)
MCH: 29.4 pg (ref 26.0–34.0)
MCHC: 33.1 g/dL (ref 30.0–36.0)
MCV: 89 fL (ref 80.0–100.0)
Platelets: 282 10*3/uL (ref 150–400)
RBC: 2.72 MIL/uL — ABNORMAL LOW (ref 3.87–5.11)
RDW: 14.4 % (ref 11.5–15.5)
WBC: 4.7 10*3/uL (ref 4.0–10.5)
nRBC: 0 % (ref 0.0–0.2)

## 2021-03-12 LAB — AEROBIC CULTURE W GRAM STAIN (SUPERFICIAL SPECIMEN): Gram Stain: NONE SEEN

## 2021-03-12 LAB — URINE CULTURE: Culture: NO GROWTH

## 2021-03-12 LAB — BASIC METABOLIC PANEL
Anion gap: 7 (ref 5–15)
BUN: 6 mg/dL — ABNORMAL LOW (ref 8–23)
CO2: 21 mmol/L — ABNORMAL LOW (ref 22–32)
Calcium: 7.3 mg/dL — ABNORMAL LOW (ref 8.9–10.3)
Chloride: 105 mmol/L (ref 98–111)
Creatinine, Ser: 0.31 mg/dL — ABNORMAL LOW (ref 0.44–1.00)
GFR, Estimated: >60 mL/min (ref >60)
Glucose, Bld: 111 mg/dL — ABNORMAL HIGH (ref 70–99)
Potassium: 4.2 mmol/L (ref 3.5–5.1)
Sodium: 133 mmol/L — ABNORMAL LOW (ref 135–145)

## 2021-03-12 NOTE — Progress Notes (Signed)
[facility] Progress Note     Subjective: Patient seen and examined.  She is sitting in the chair. She states that her wound pain is improved since admission.  She is tolerating her diet without nausea and vomiting.  She is moving her bowels without issue.  She has no acute complaints at this time.  Objective: Vital signs in last 24 hours: Temp:  [98 F (36.7 C)-98.6 F (37 C)] 98 F (36.7 C) (01/02 0538) Pulse Rate:  [81-98] 81 (01/02 0538) Resp:  [18] 18 (01/02 0538) BP: (94-101)/(52-54) 94/54 (01/02 0538) SpO2:  [99 %-100 %] 100 % (01/02 0538) Last BM Date: 03/11/21  Intake/Output from previous day: 01/01 0701 - 01/02 0700 In: 1411.9 [P.O.:360; I.V.:663.6; IV Piggyback:388.3] Out: 400 [Urine:400] Intake/Output this shift: Total I/O In: 120 [P.O.:120] Out: 200 [Urine:200]  General appearance: alert, cooperative, and no distress Resp: clear to auscultation bilaterally Cardio: regular rate and rhythm, S1, S2 normal, no murmur, click, rub or gallop GI: soft, non-tender; bowel sounds normal; no masses,  no organomegaly Incision/Wound: Right inguinal incision with wet-to-dry Kerlix packing in place, minimal purulent drainage noted deep within the wound  Lab Results:  Recent Labs    03/11/21 0223 03/12/21 0350  WBC 6.2 4.7  HGB 8.4* 8.0*  HCT 24.9* 24.2*  PLT 246 282   BMET Recent Labs    03/11/21 0223 03/12/21 0350  NA 134* 133*  K 2.9* 4.2  CL 103 105  CO2 22 21*  GLUCOSE 89 111*  BUN 6* 6*  CREATININE <0.30* 0.31*  CALCIUM 7.1* 7.3*   PT/INR Recent Labs    03/10/21 1608  LABPROT 15.8*  INR 1.3*    Studies/Results: CT ABDOMEN PELVIS W CONTRAST  Result Date: 03/10/2021 CLINICAL DATA:  Postop. On fluid collection. Abdominal pain. Recent hernia surgery. EXAM: CT ABDOMEN AND PELVIS WITH CONTRAST TECHNIQUE: Multidetector CT imaging of the abdomen and pelvis was performed using the standard protocol following bolus administration of  intravenous contrast. CONTRAST:  159mL OMNIPAQUE IOHEXOL 300 MG/ML  SOLN COMPARISON:  02/15/2021. FINDINGS: Lower chest: Trace left effusion. Mild dependent lower lobe atelectasis. Hepatobiliary: Liver normal size. Stable small low-density lesion adjacent to the falciform ligament. No liver abnormalities. Status post cholecystectomy. No bile duct dilation. Pancreas: Unremarkable. No pancreatic ductal dilatation or surrounding inflammatory changes. Spleen: Normal in size without focal abnormality. Adrenals/Urinary Tract: Adrenal glands are unremarkable. Kidneys are normal, without renal calculi, focal lesion, or hydronephrosis. Bladder is unremarkable. Stomach/Bowel: New small bowel anastomosis in the right lower quadrant. Adjacent small bowel loops show mild wall thickening. There is a contiguous collection in the anterior, right pelvis, measuring 6 x 3.1 x 5.8 cm. Remainder the small bowel is unremarkable, normal in caliber with no other areas of wall thickening. Colon is normal in caliber with no wall thickening or adjacent inflammation. Vascular/Lymphatic: Aortic atherosclerosis. No aneurysm. No enlarged lymph nodes. Reproductive: Uterus and bilateral adnexa are unremarkable. Other: Fluid collection in the subcutaneous soft tissues of the right lower quadrant, along the right lower quadrant incision. Collection has ill-defined margins. It measures approximately, 8.5 x 4 x 5.2 cm. There is adjacent inflammatory stranding. This collection may communicate with the adjacent collection anterior right pelvic peritoneal cavity. Musculoskeletal: No fracture or acute finding.  No bone lesion. IMPRESSION: 1. Two adjacent postop collections, 1 in the subcutaneous soft tissues of the right lower quadrant along the incision, 8.5 x 4 x 5.2 cm in size, the other below this, in the right anterior pelvis peritoneal  cavity measuring 6 x 3.1 x 5.8 cm. There associated inflammatory changes mostly in right lower quadrant anterior  abdominal wall fat. 2. Small bowel anastomosis in the right lower quadrant adjacent to the pelvic collection, associated with mild small bowel wall thickening. 3. No evidence of bowel obstruction.  No free intraperitoneal air. Electronically Signed   By: Lajean Manes M.D.   On: 03/10/2021 17:39   DG Chest Port 1 View  Result Date: 03/10/2021 CLINICAL DATA:  Questionable sepsis. EXAM: PORTABLE CHEST 1 VIEW COMPARISON:  Chest x-ray with ribs 10/07/2011. FINDINGS: There is a band of opacity in the right lower lung. The lungs are otherwise clear. There is no pleural effusion or pneumothorax. The cardiomediastinal silhouette is within normal limits. No acute fractures are seen. There are surgical clips in the right abdomen. IMPRESSION: 1. Band of opacity in the right lower lung favored is atelectasis. Infection can not be entirely excluded. Followup PA and lateral chest X-ray is recommended in 3-4 weeks following trial of antibiotic therapy to ensure resolution and exclude underlying malignancy. Electronically Signed   By: Ronney Asters M.D.   On: 03/10/2021 16:56    Anti-infectives: Anti-infectives (From admission, onward)    Start     Dose/Rate Route Frequency Ordered Stop   03/11/21 0600  vancomycin (VANCOREADY) IVPB 500 mg/100 mL        500 mg 100 mL/hr over 60 Minutes Intravenous Every 12 hours 03/10/21 1837     03/10/21 2200  piperacillin-tazobactam (ZOSYN) IVPB 3.375 g        3.375 g 12.5 mL/hr over 240 Minutes Intravenous Every 8 hours 03/10/21 1830     03/10/21 1845  vancomycin (VANCOCIN) IVPB 1000 mg/200 mL premix        1,000 mg 200 mL/hr over 60 Minutes Intravenous  Once 03/10/21 1832 03/10/21 2143   03/10/21 1615  cefTRIAXone (ROCEPHIN) 2 g in sodium chloride 0.9 % 100 mL IVPB        2 g 200 mL/hr over 30 Minutes Intravenous  Once 03/10/21 1601 03/10/21 1721   03/10/21 1615  metroNIDAZOLE (FLAGYL) IVPB 500 mg        500 mg 100 mL/hr over 60 Minutes Intravenous  Once 03/10/21 1601  03/10/21 1827       Assessment/Plan: S/P open right inguinal herniorrhaphy with partial small bowel resection 03/02/2021 with subsequent readmission for deep wound infection.  -Continue with daily wet-to-dry dressing changes, dressing changed this morning with small amount of purulent drainage noted deep within the wound -Leukocytosis continuing to improve, 4.7 from 6.2 yesterday -Continue vancomycin and Zosyn -Will consider possible reimaging tomorrow to evaluate for resolution of previously noted pelvic abscess -Regular diet -Antiemetics and pain control as needed -DVT prophylaxis with Lovenox  LOS: 2 days    Janice Wells A Vitaly Wanat 03/12/2021

## 2021-03-13 ENCOUNTER — Inpatient Hospital Stay (HOSPITAL_COMMUNITY): Payer: 59

## 2021-03-13 LAB — CBC
HCT: 26.7 % — ABNORMAL LOW (ref 36.0–46.0)
Hemoglobin: 8.6 g/dL — ABNORMAL LOW (ref 12.0–15.0)
MCH: 29.1 pg (ref 26.0–34.0)
MCHC: 32.2 g/dL (ref 30.0–36.0)
MCV: 90.2 fL (ref 80.0–100.0)
Platelets: 348 10*3/uL (ref 150–400)
RBC: 2.96 MIL/uL — ABNORMAL LOW (ref 3.87–5.11)
RDW: 14.4 % (ref 11.5–15.5)
WBC: 5.2 10*3/uL (ref 4.0–10.5)
nRBC: 0 % (ref 0.0–0.2)

## 2021-03-13 LAB — BASIC METABOLIC PANEL
Anion gap: 6 (ref 5–15)
BUN: <5 mg/dL — ABNORMAL LOW (ref 8–23)
CO2: 24 mmol/L (ref 22–32)
Calcium: 7.6 mg/dL — ABNORMAL LOW (ref 8.9–10.3)
Chloride: 106 mmol/L (ref 98–111)
Creatinine, Ser: 0.3 mg/dL — ABNORMAL LOW (ref 0.44–1.00)
GFR, Estimated: >60 mL/min (ref >60)
Glucose, Bld: 103 mg/dL — ABNORMAL HIGH (ref 70–99)
Potassium: 4.3 mmol/L (ref 3.5–5.1)
Sodium: 136 mmol/L (ref 135–145)

## 2021-03-13 MED ORDER — METRONIDAZOLE 500 MG/100ML IV SOLN
500.0000 mg | Freq: Two times a day (BID) | INTRAVENOUS | Status: DC
  Administered 2021-03-13: 500 mg via INTRAVENOUS
  Filled 2021-03-13 (×2): qty 100

## 2021-03-13 MED ORDER — POTASSIUM CHLORIDE CRYS ER 20 MEQ PO TBCR
20.0000 meq | EXTENDED_RELEASE_TABLET | Freq: Two times a day (BID) | ORAL | Status: DC
  Administered 2021-03-13 – 2021-03-14 (×2): 20 meq via ORAL
  Filled 2021-03-13 (×2): qty 1

## 2021-03-13 MED ORDER — IOHEXOL 300 MG/ML  SOLN
100.0000 mL | Freq: Once | INTRAMUSCULAR | Status: AC | PRN
  Administered 2021-03-13: 80 mL via INTRAVENOUS

## 2021-03-13 MED ORDER — SODIUM CHLORIDE 0.9 % IV SOLN
2.0000 g | INTRAVENOUS | Status: DC
  Administered 2021-03-13: 2 g via INTRAVENOUS
  Filled 2021-03-13: qty 20

## 2021-03-13 NOTE — Progress Notes (Signed)
°  Subjective: Patient has no complaints of pain.  Objective: Vital signs in last 24 hours: Temp:  [98 F (36.7 C)-98.4 F (36.9 C)] 98 F (36.7 C) (01/03 0500) Pulse Rate:  [85-90] 85 (01/03 0500) Resp:  [18-20] 18 (01/03 0500) BP: (102-147)/(57-72) 147/72 (01/03 0500) SpO2:  [98 %-100 %] 98 % (01/03 0500) Last BM Date: 03/11/21  Intake/Output from previous day: 01/02 0701 - 01/03 0700 In: 523.8 [P.O.:120; I.V.:224.7; IV Piggyback:179] Out: 600 [Urine:600] Intake/Output this shift: Total I/O In: 73 [IV Piggyback:73] Out: -   General appearance: alert, cooperative, and no distress Resp: clear to auscultation bilaterally Cardio: regular rate and rhythm, S1, S2 normal, no murmur, click, rub or gallop GI: Soft, wound healing well by secondary intention.  Packing with minimal purulent fluid.  Lab Results:  Recent Labs    03/12/21 0350 03/13/21 0745  WBC 4.7 5.2  HGB 8.0* 8.6*  HCT 24.2* 26.7*  PLT 282 348   BMET Recent Labs    03/12/21 0350 03/13/21 0745  NA 133* 136  K 4.2 4.3  CL 105 106  CO2 21* 24  GLUCOSE 111* 103*  BUN 6* <5*  CREATININE 0.31* 0.30*  CALCIUM 7.3* 7.6*   PT/INR Recent Labs    03/10/21 1608  LABPROT 15.8*  INR 1.3*    Studies/Results: No results found.  Anti-infectives: Anti-infectives (From admission, onward)    Start     Dose/Rate Route Frequency Ordered Stop   03/13/21 1600  cefTRIAXone (ROCEPHIN) 2 g in sodium chloride 0.9 % 100 mL IVPB        2 g 200 mL/hr over 30 Minutes Intravenous Every 24 hours 03/13/21 0828     03/13/21 1600  metroNIDAZOLE (FLAGYL) IVPB 500 mg        500 mg 100 mL/hr over 60 Minutes Intravenous Every 12 hours 03/13/21 0828     03/11/21 0600  vancomycin (VANCOREADY) IVPB 500 mg/100 mL  Status:  Discontinued        500 mg 100 mL/hr over 60 Minutes Intravenous Every 12 hours 03/10/21 1837 03/13/21 0826   03/10/21 2200  piperacillin-tazobactam (ZOSYN) IVPB 3.375 g  Status:  Discontinued        3.375  g 12.5 mL/hr over 240 Minutes Intravenous Every 8 hours 03/10/21 1830 03/13/21 0826   03/10/21 1845  vancomycin (VANCOCIN) IVPB 1000 mg/200 mL premix        1,000 mg 200 mL/hr over 60 Minutes Intravenous  Once 03/10/21 1832 03/10/21 2143   03/10/21 1615  cefTRIAXone (ROCEPHIN) 2 g in sodium chloride 0.9 % 100 mL IVPB        2 g 200 mL/hr over 30 Minutes Intravenous  Once 03/10/21 1601 03/10/21 1721   03/10/21 1615  metroNIDAZOLE (FLAGYL) IVPB 500 mg        500 mg 100 mL/hr over 60 Minutes Intravenous  Once 03/10/21 1601 03/10/21 1827       Impression: Postoperative wound infection.  Cultures revealed E. coli.  We will de-escalate antibiotics.  We will get follow-up CT scan to assess intra-abdominal portion of abscess.  LOS: 3 days    Aviva Signs 03/13/2021

## 2021-03-13 NOTE — TOC Progression Note (Signed)
°  Transition of Care [facility]) Screening Note   Patient Details  Name: [name] Date of Birth: Feb 16, 1961   Transition of Care [facility]) CM/SW Contact:    Villa Herb, LCSWA Phone Number: 03/13/2021, 11:14 AM    Transition of Care Department [facility]) has reviewed patient and no TOC needs have been identified at this time. We will continue to monitor patient advancement through interdisciplinary progression rounds. If new patient transition needs arise, please place a TOC consult.

## 2021-03-13 NOTE — Progress Notes (Signed)
Patient dressing done per MD order.

## 2021-03-14 LAB — CREATININE, SERUM
Creatinine, Ser: 0.36 mg/dL — ABNORMAL LOW (ref 0.44–1.00)
GFR, Estimated: >60 mL/min (ref >60)

## 2021-03-14 MED ORDER — SULFAMETHOXAZOLE-TRIMETHOPRIM 800-160 MG PO TABS: 1.0000 | ORAL_TABLET | Freq: Two times a day (BID) | ORAL | 0 refills | Status: DC

## 2021-03-14 MED ORDER — HYDROCODONE-ACETAMINOPHEN 5-325 MG PO TABS: 1.0000 | ORAL_TABLET | ORAL | 0 refills | Status: DC | PRN

## 2021-03-14 NOTE — TOC Initial Note (Signed)
Transition of Care [facility]) - Initial/Assessment Note    Patient Details  Name: Janice Wells MRN: [id number] Date of Birth: [birth year]  Transition of Care [facility]) CM/SW Contact:    Villa Herb, LCSWA Phone Number: 03/14/2021, 12:05 PM  Clinical Narrative:                 CSW spoke with Dr. Lovell Sheehan to inform that no [facility] agency is able to accept pt. CSW and MD spoke about possibility of SNF at d/c. CSW used interpreter to speak with pt in room and pt is agreeable. However, CSW found during chart review that pt has no SSN, CSW confirmed this with pt whole using interpreter. Pt and MD informed that SNF will no longer be an option for pt.   MD requested that pt be set up with the outpatient wound care clinic for 3 times a week. CSW made ambulatory referral to the wound care center. CSW informed pt of this. Pt and family in room understanding. CSW informed by family that someone will be in the home with pt however, they will not be able to assist with dressing changes. CSW informed that RN staff will work on teaching pt and family in room how to do dressing changes but they will also be able to go into the wound care center a few times a week. TOC to follow.   Expected Discharge Plan: OP Rehab Barriers to Discharge: Barriers Resolved   Patient Goals and CMS Choice Patient states their goals for this hospitalization and ongoing recovery are:: return home CMS Medicare.gov Compare Post Acute Care list provided to:: Patient Choice offered to / list presented to : Patient  Expected Discharge Plan and Services Expected Discharge Plan: OP Rehab In-house Referral: Clinical Social Work Discharge Planning Services: CM Consult   Living arrangements for the past 2 months: Mobile Home                                      Prior Living Arrangements/Services Living arrangements for the past 2 months: Mobile Home Lives with:: Self Patient language and need for interpreter reviewed::  Yes Do you feel safe going back to the place where you live?: Yes      Need for Family Participation in Patient Care: No (Comment) Care giver support system in place?: Yes (comment)   Criminal Activity/Legal Involvement Pertinent to Current Situation/Hospitalization: No - Comment as needed  Activities of Daily Living Home Assistive Devices/Equipment: None ADL Screening (condition at time of admission) Patient's cognitive ability adequate to safely complete daily activities?: Yes Is the patient deaf or have difficulty hearing?: No Does the patient have difficulty seeing, even when wearing glasses/contacts?: No Does the patient have difficulty concentrating, remembering, or making decisions?: No Patient able to express need for assistance with ADLs?: Yes Does the patient have difficulty dressing or bathing?: Yes Independently performs ADLs?: No Communication: Independent Dressing (OT): Needs assistance Is this a change from baseline?: Change from baseline, expected to last <3days Grooming: Independent Feeding: Independent Bathing: Needs assistance Is this a change from baseline?: Change from baseline, expected to last <3 days Toileting: Needs assistance Is this a change from baseline?: Change from baseline, expected to last <3 days In/Out Bed: Needs assistance Is this a change from baseline?: Change from baseline, expected to last <3 days Walks in Home: Needs assistance Is this a change from baseline?: Change from baseline, expected to  last <3 days Does the patient have difficulty walking or climbing stairs?: Yes Weakness of Legs: None Weakness of Arms/Hands: None  Permission Sought/Granted                  Emotional Assessment Appearance:: Appears stated age Attitude/Demeanor/Rapport: Engaged Affect (typically observed): Accepting Orientation: : Oriented to Self, Oriented to Place, Oriented to  Time, Oriented to Situation Alcohol / Substance Use: Not Applicable Psych  Involvement: No (comment)  Admission diagnosis:  Abdominal wall abscess [L02.211] Wound infection after surgery [T81.49XA] Sepsis without acute organ dysfunction, due to unspecified organism [facility]) [A41.9] Patient Active Problem List   Diagnosis Date Noted   Wound infection after surgery 03/10/2021   Incarcerated right inguinal hernia    Ileus following gastrointestinal surgery (HCC) 02/27/2021   Oropharyngeal dysphagia    Constipation 02/16/2021   Non-English speaking patient 02/16/2021   Right inguinal hernia    Generalized abdominal pain 02/07/2021   Hypokalemia 02/07/2021   Intractable nausea and vomiting 02/07/2021   SBO (small bowel obstruction) (HCC) 02/06/2021   GERD without esophagitis 11/27/2020   Obesity, unspecified 02/15/2015   PCP:  Jacquelin Hawking, PA-C Pharmacy:   [facility] - [place], [place] - [address]  [place] [address] Phone: [phone] Fax: [phone]     Social Determinants of Health (SDOH) Interventions    Readmission Risk Interventions No flowsheet data found.

## 2021-03-14 NOTE — Progress Notes (Signed)
Pt dressing changed with family teaching per MD order.

## 2021-03-14 NOTE — TOC Transition Note (Signed)
CSW spoke with outpatient rehab clinic to set up pts appointments.  Pt will have initial evaluation Friday 1/6 at 10am.   Pts follow up appointments have been added to her AVS.   Pts RN will update and go over with pt of when to arrive to appointments. TOC signing off.

## 2021-03-14 NOTE — Discharge Summary (Signed)
Physician Discharge Summary  Patient ID: Janice Wells MRN: [id number] DOB/AGE: 1960-10-22 61 y.o.  Admit date: 03/10/2021 Discharge date: 03/14/2021  Admission Diagnoses: Postoperative wound infection  Discharge Diagnoses:  Principal Problem:   Wound infection after surgery   Discharged Condition: good  Hospital Course: Patient is a 61 year old Hispanic female status post a right inguinal herniorrhaphy with mesh and partial small bowel resection for incarcerated right inguinal hernia on 03/02/2021 who presented back to the hospital on 03/10/2021 with swelling and erythema at the incision site on the right lower quadrant.  CT scan of the abdomen and pelvis revealed a large abscess deep in the soft tissue as well as just below the repair.  This did not appear to be in continuity with the staple line of the partial small bowel resection.  The wound was opened up at bedside and cultures were taken.  Foul-smelling purulent material was found.  There was no evidence of succus entericus within the wound.  The wound was packed.  Final cultures revealed E. coli.  The patient has been receiving IV antibiotics and the wound has cleared nicely.  Attempts at providing home health services as well as a SNF were unsuccessful.  The patient will be receiving outpatient wound care 3 times a week.  This consist of normal saline wet-to-dry dressings.  Discharge Exam: Blood pressure 125/75, pulse 84, temperature 98 F (36.7 C), temperature source Oral, resp. rate 18, height 4\' 10"  (1.473 m), weight 60.8 kg, last menstrual period 03/11/2008, SpO2 97 %. General appearance: alert, cooperative, and no distress Resp: clear to auscultation bilaterally Cardio: regular rate and rhythm, S1, S2 normal, no murmur, click, rub or gallop GI: Soft, wound healing well by secondary intention.  Packing in place.  Disposition: Discharge disposition: 01-Home or Self Care       Discharge Instructions     AMB  referral to wound care center   Complete by: As directed    Patient needs 3 days a week wound care dressing changes.   Diet - low sodium heart healthy   Complete by: As directed    Increase activity slowly   Complete by: As directed       Allergies as of 03/14/2021   No Known Allergies      Medication List     STOP taking these medications    docusate sodium 100 MG capsule Commonly known as: Colace   multivitamin with minerals Tabs tablet   ondansetron 4 MG tablet Commonly known as: ZOFRAN       TAKE these medications    HYDROcodone-acetaminophen 5-325 MG tablet Commonly known as: Norco Take 1 tablet by mouth every 4 (four) hours as needed for moderate pain. What changed: when to take this   pantoprazole 40 MG tablet Commonly known as: PROTONIX Take 1 tablet (40 mg total) by mouth daily. Tome una tableta por boca diaria   sulfamethoxazole-trimethoprim 800-160 MG tablet Commonly known as: BACTRIM DS Take 1 tablet by mouth 2 (two) times daily.        Follow-up Information     Aviva Signs, MD. Schedule an appointment as soon as possible for a visit on 03/28/2021.   Specialty: General Surgery Contact information: [address] [phone]                 Signed: Aviva Signs 03/14/2021, 1:15 PM

## 2021-03-15 LAB — CULTURE, BLOOD (ROUTINE X 2)
Culture: NO GROWTH
Culture: NO GROWTH
Special Requests: ADEQUATE
Special Requests: ADEQUATE

## 2021-03-16 ENCOUNTER — Other Ambulatory Visit: Payer: Self-pay

## 2021-03-16 ENCOUNTER — Encounter (HOSPITAL_COMMUNITY): Payer: Self-pay | Admitting: Physical Therapy

## 2021-03-16 ENCOUNTER — Ambulatory Visit (HOSPITAL_COMMUNITY): Payer: 59 | Admitting: Physical Therapy

## 2021-03-16 DIAGNOSIS — R1031 Right lower quadrant pain: Secondary | ICD-10-CM

## 2021-03-16 DIAGNOSIS — T8130XA Disruption of wound, unspecified, initial encounter: Secondary | ICD-10-CM

## 2021-03-16 NOTE — Therapy (Signed)
[facility] [address] [place], [place], [address] Phone: [phone]   Fax:  [phone]  Wound Care Evaluation  Patient Details  Name: Janice Wells MRN: [id number] Date of Birth: [birth year] No data recorded  Encounter Date: 03/16/2021   PT End of Session - 03/16/21 1220     Visit Number 1    Number of Visits 18    Date for PT Re-Evaluation 04/27/21    Authorization Type pending medicaid    PT Start Time 1000    PT Stop Time 1035    PT Time Calculation (min) 35 min    Activity Tolerance Patient tolerated treatment well             Past Medical History:  Diagnosis Date   Acute cholecystitis 02/02/2021   GERD without esophagitis 11/27/2020   Obesity, unspecified 02/15/2015    Past Surgical History:  Procedure Laterality Date   BOWEL RESECTION N/A 03/02/2021   Procedure: PARTIAL SMALL BOWEL RESECTION;  Surgeon: Franky Macho, MD;  Location: [facility];  Service: General;  Laterality: N/A;   CHOLECYSTECTOMY N/A 02/03/2021   Procedure: LAPAROSCOPIC CHOLECYSTECTOMY;  Surgeon: Franky Macho, MD;  Location: [facility];  Service: General;  Laterality: N/A;   INGUINAL HERNIA REPAIR Right 03/02/2021   Procedure: HERNIA REPAIR INGUINAL ADULT;  Surgeon: Franky Macho, MD;  Location: [facility];  Service: General;  Laterality: Right;    There were no vitals filed for this visit.      Wound Therapy - 03/16/21 0001     Subjective Hx: Per chart review and interpreter, pt does not speak [place]. Pt had a partial bowel resection on 12/23; returned to the hospital on 12/31 due to redness.  PT admitted for IV antibiotics as well as having the wound opened.  D/C on 1/4 and is being referred for dressing changes TIW.    Patient and Family Stated Goals wound to heal    Date of Onset 03/10/21    Prior Treatments hospitalized with IV antibiotics and dressing chnge    Pain Scale 0-10    Pain Score 7     Pain Type Acute pain    Pain  Location Abdomen    Pain Orientation Right    Pain Intervention(s) Emotional support    Evaluation and Treatment Procedures Explained to Patient/Family Yes    Evaluation and Treatment Procedures agreed to    Incision Properties Date First Assessed: 03/02/21 Time First Assessed: 1037 Location: Abdomen Location Orientation: Other (Comment)   Dressing Type Abdominal pads;Gauze (Comment)    Dressing Clean;Old drainage (marked)    Dressing Change Frequency Monday, Wednesday, Friday    Site / Wound Assessment --   three times a week   Incision Length (cm) 9.5 cm   depth 4.5 cm   Closure None    Drainage Description Serosanguineous    Treatment Cleansed;Other (Comment)   packing   Wound Therapy - Clinical Statement Janice Wells is a 61 yo non-English speaking female who was admitted into the hospital on 12/23 for a Rt inguinal herniorrahapy with partial bowel resection.  SheReturened to the hopital on 12/31 due to redness at the incision.  Imaging showed an abcess which was opened and drained.  She recieved IV antibiotics as well as dressing changes until her discharge on 03/14/2021 and is currntly being referred to skilled PT to ensure a healing environment is maintained.  Janice Wells will benefit from skilled therapy to prevent infection and ensure proper healing is occuring.  Factors Delaying/Impairing Wound Healing Infection - systemic/local    Wound Therapy - Frequency 3X / week   6 weeks   Wound Therapy - Current Recommendations PT    Wound Plan irrigation and dressing changes    Dressing  wound packed with 3 " kerlix soaked with saline and hydrogel f/b abd pad and tape to secure.                   Objective measurements completed on examination: See above findings.             PT Education - 03/16/21 1218     Education Details Keep dressing intact and dry.  PT gave pt extra tape in case tape started to come loose.    Person(s) Educated Patient;Other (comment)    interpreter   Methods Explanation    Comprehension Verbalized understanding              PT Short Term Goals - 03/16/21 1223       PT SHORT TERM GOAL #1   Title PT wound depth to be no greater than 2.5 cm    Time 3    Period Weeks    Status New    Target Date 04/06/21      PT SHORT TERM GOAL #2   Title Pt pain to be no greater than a 3/10 to allow pt to feel up to completing ADL's    Time 3    Period Weeks               PT Long Term Goals - 03/16/21 1224       PT LONG TERM GOAL #1   Title Pt wound to have no depth and be no greater than 2 x .5 cm for pt to feel confident in self care techniques.    Time 6    Period Weeks    Status New    Target Date 04/27/21      PT LONG TERM GOAL #2   Title PT pain to be no greater than a 1/10    Time 6    Period Weeks    Status New                  Plan - 03/16/21 1221     Clinical Impression Statement see above    Personal Factors and Comorbidities Past/Current Experience    Examination-Activity Limitations Bathing;Dressing    Stability/Clinical Decision Making Stable/Uncomplicated    Clinical Decision Making Low    Rehab Potential Good    PT Frequency 3x / week    PT Duration 6 weeks    PT Treatment/Interventions Other (comment)   wound care   PT Next Visit Plan debride if necessary; wet to moist dressing changes    Consulted and Agree with Plan of Care Patient             Patient will benefit from skilled therapeutic intervention in order to improve the following deficits and impairments:  Decreased skin integrity, Pain  Visit Diagnosis: Abdominal wound dehiscence, initial encounter  Right lower quadrant abdominal pain    Problem List Patient Active Problem List   Diagnosis Date Noted   Wound infection after surgery 03/10/2021   Incarcerated right inguinal hernia    Ileus following gastrointestinal surgery (HCC) 02/27/2021   Oropharyngeal dysphagia    Constipation 02/16/2021    Non-English speaking patient 02/16/2021   Right inguinal hernia    Generalized abdominal pain 02/07/2021  Hypokalemia 02/07/2021   Intractable nausea and vomiting 02/07/2021   SBO (small bowel obstruction) (HCC) 02/06/2021   GERD without esophagitis 11/27/2020   Obesity, unspecified 02/15/2015    Alyssabeth Bruster,CINDY, PT 03/16/2021, 12:27 PM  [facility] [address] [place], [place], [address] Phone: [phone]   Fax:  [phone]  Name: Cherylee Rawlinson MRN: [id number] Date of Birth: 30-Jun-1960

## 2021-03-19 ENCOUNTER — Other Ambulatory Visit: Payer: Self-pay

## 2021-03-19 ENCOUNTER — Telehealth (HOSPITAL_COMMUNITY): Payer: Self-pay | Admitting: Physical Therapy

## 2021-03-19 ENCOUNTER — Encounter: Payer: Self-pay | Admitting: Gastroenterology

## 2021-03-19 ENCOUNTER — Ambulatory Visit (INDEPENDENT_AMBULATORY_CARE_PROVIDER_SITE_OTHER): Payer: Self-pay | Admitting: Gastroenterology

## 2021-03-19 ENCOUNTER — Ambulatory Visit (HOSPITAL_COMMUNITY): Payer: 59 | Admitting: Physical Therapy

## 2021-03-19 VITALS — BP 109/64 | HR 82 | Temp 96.8°F | Ht 59.0 in | Wt 119.6 lb

## 2021-03-19 DIAGNOSIS — K219 Gastro-esophageal reflux disease without esophagitis: Secondary | ICD-10-CM

## 2021-03-19 DIAGNOSIS — R1013 Epigastric pain: Secondary | ICD-10-CM

## 2021-03-19 DIAGNOSIS — R6881 Early satiety: Secondary | ICD-10-CM

## 2021-03-19 DIAGNOSIS — R110 Nausea: Secondary | ICD-10-CM

## 2021-03-19 DIAGNOSIS — R1310 Dysphagia, unspecified: Secondary | ICD-10-CM

## 2021-03-19 MED ORDER — OMEPRAZOLE 40 MG PO CPDR: 40.0000 mg | DELAYED_RELEASE_CAPSULE | Freq: Every day | ORAL | 3 refills | Status: DC

## 2021-03-19 NOTE — Patient Instructions (Addendum)
Please start omeprazole 40mg  once daily before breakfast. Prescription sent to [facility] in [place].  You can fill out an application for Med-Assist to get medication assistance through the [facility] but since I'm not sure how long that will take to get approved, we will have you pick up your first prescription from [facility].  Please return in 2-3 weeks. If you are not better, you will need to have an upper endoscopy (to look inside your stomach) to determine what is causing your symptoms.   1. Comience con omeprazol 20 mg una vez al da antes del desayuno. Receta enviada a [facility] en [place]. 2. Puede completar una solicitud para Med-Assist para obtener asistencia con medicamentos a travs de la [employer], pero como no estoy seguro de cunto tardar en aprobarse, le pediremos que recoja su primera receta de [facility]. 3. Vuelva en 2-3 semanas. Si no mejora, deber realizarse una endoscopia digestiva alta (para mirar dentro del estmago) para determinar qu est causando sus sntomas.    Endoscopa alta en los adultos Upper Endoscopy, Adult Una endoscopa alta es un procedimiento que permite observar dentro de la porcin alta del tracto GI (gastrointestinal). La porcin alta del tracto GI se compone de lo siguiente: La parte del cuerpo que transporta los alimentos desde la boca hasta el estmago (esfago). [place]. La primera parte del intestino delgado (duodeno). Este procedimiento tambin se conoce como esofagogastroduodenoscopia (EGD) o gastrostoma. En este procedimiento, el mdico introduce un tubo delgado y flexible (endoscopio) a travs de la boca y por el esfago, hasta el [place]. Se fija una pequea cmara al extremo del tubo. La cmara captura imgenes que se reproducen en un monitor de la sala de examen. Durante este procedimiento, el mdico tambin podra extraer una pequea porcin de tejido para enviarla a un laboratorio a que la examinen con un microscopio  (biopsia). El mdico podra realizar una endoscopa alta para diagnosticar distintos tipos de cncer en la porcin alta del tracto GI. Es posible que Youth worker procedimiento para Scientist, research (physical sciences) causa de ciertas afecciones, como: Dolor de [place]. Acidez estomacal. Dolor o problemas al tragar. Nuseas y vmitos. Hemorragia estomacal. lceras estomacales. Informe al mdico acerca de lo siguiente: Cualquier alergia que tenga. Todos los [employer], incluidos vitaminas, hierbas, gotas oftlmicas, cremas y medicamentos de venta libre. Cualquier problema previo que usted o algn miembro de su familia haya tenido con los anestsicos. Cualquier trastorno de la sangre que tenga. Cirugas a las que se haya sometido. Cualquier afeccin mdica que tenga. Si est embarazada o podra estarlo. Cules son los riesgos? En general, se trata de un procedimiento seguro. Sin embargo, pueden ocurrir complicaciones, por ejemplo: Infeccin. Sangrado. Reacciones alrgicas a los medicamentos. Un desgarro u orificio (perforacin) del esfago, el estmago o el duodeno. Qu ocurre antes del procedimiento? Hidratacin Siga las indicaciones del mdico acerca de mantenerse hidratado, las cuales pueden incluir lo siguiente: Hasta 2 horas antes del procedimiento, puede beber lquidos transparentes, como agua, jugos de fruta sin pulpa, caf negro y t solo.  Restricciones en las comidas y bebidas Siga las indicaciones del mdico respecto de las comidas y bebidas, las cuales pueden incluir lo siguiente: 8 horas antes del procedimiento, no coma alimentos pesados, por ejemplo, carnes, alimentos con alto contenido graso o fritos. 6 horas antes del procedimiento, deje de ingerir comidas o alimentos livianos, como tostadas o cereales. 6 horas antes del procedimiento, deje de tomar leche o bebidas que [employer]. 2 horas antes del procedimiento, deje de  beber lquidos  transparentes. Medicamentos Consulte al mdico sobre: Quarry manager o suspender los medicamentos que toma habitualmente. Esto es muy importante si toma medicamentos para la diabetes o anticoagulantes. Tomar medicamentos como aspirina e ibuprofeno. Estos medicamentos pueden tener un efecto anticoagulante en la [place]. No tome estos medicamentos a menos que el mdico se lo indique. Tomar medicamentos de [employer], vitaminas, hierbas y suplementos. Indicaciones generales Haga que alguien lo lleve a su casa desde el hospital o la clnica. Si se ir a su casa inmediatamente despus del procedimiento, pdale a alguien que se quede con usted durante 24 horas. Pregntele al mdico qu medidas se tomarn para ayudar a prevenir una infeccin. Qu ocurre durante el procedimiento?  Le colocarn una va intravenosa en una de las venas. Pueden administrarle uno o ms de los siguientes medicamentos: Un medicamento para ayudarlo a Nurse, children's (sedante). Un medicamento para adormecer la garganta (anestesia local). Deber recostarse del costado izquierdo sobre una camilla. El mdico har pasar el endoscopio a travs de la boca y Medical illustrator. El mdico [employer] endoscopio para ver el interior de su esfago, Paramedic y [place]. Podran realizarse biopsias. El endoscopio se Charity fundraiser. El procedimiento puede variar segn el mdico y el hospital. Sander Nephew ocurre despus del procedimiento? Le controlarn la presin arterial, la frecuencia cardaca, la frecuencia respiratoria y Retail buyer de oxgeno en la sangre hasta que le den el alta del hospital o la clnica. No conduzca durante 24 horas si le administraron un sedante durante el procedimiento. Cuando la garganta ya no est adormecida, le pueden dar lquido para beber. Es su responsabilidad retirar [employer] del procedimiento. Pregntele al mdico o a alguien del departamento donde se realice el procedimiento cundo estarn [employer]. Resumen Una  endoscopa alta es un procedimiento que permite observar dentro de la porcin alta del tracto GI. Durante el procedimiento, le colocarn una va intravenosa en una de las venas. Es posible que le administren un medicamento para ayudarlo a Nurse, children's. Usarn un medicamento para anestesiarle la garganta. Le introducirn el endoscopio por la boca y lo llevarn Programmer, applications. Esta informacin no tiene Marine scientist el consejo del mdico. Asegrese de hacerle al mdico cualquier pregunta que tenga. Document Revised: 09/04/2017 Document Reviewed: 09/04/2017 Elsevier Patient Education  [place].

## 2021-03-19 NOTE — Progress Notes (Signed)
Primary Care Physician:  Soyla Dryer, PA-C  Primary Gastroenterologist:  Elon Alas. Abbey Chatters, DO   Chief Complaint  Patient presents with   hosp f/u   Nausea    Comes and goes, no vomiting. Not able to eat much-feels full. Stools more loose. Burning in stomach   Gastroesophageal Reflux   Dysphagia    HPI:  Janice Wells is a 61 y.o. female here for hospital follow-up for GERD, dysphagia, screening colonoscopy. Patient was seen in the hospital February 18, 2021.  Patient has complicated recent medical history. She was hospitalized November 25 through November 27 for acute cholecystitis, cholelithiasis.  Completed laparoscopic cholecystectomy on 02/03/2021.  She was readmitted November 29 through December 2 when she presented with abdominal pain and vomiting.  Abdominal x-ray showed some dilated small bowel loops in the left abdomen measuring up to 4 cm containing air-fluid levels.  Air seen throughout nondilated colon to the level of the rectum.  Surgery felt like this was most likely due to ileus from surgery rather than an obstruction.  HIDA scan was negative for bile leak.  LFTs were normal.  Elevated lipase felt to be secondary to nausea/vomiting.  She was started on a PPI.  Patient readmitted December 8 through December 12 for persistent vomiting, epigastric pain.  CT showed right inguinal hernia containing a loop of small bowel.  This is transition point for small bowel obstruction.  Small bowel loops proximal to this level dilated and fluid-filled measuring up to 3.5 cm.  Stomach nondilated.  Small bowel distal to this level decompressed.  Hernia was reduced.  Decision was made to try to hold off on hernia surgery for several weeks out given recent cholecystectomy.  GI was consulted during this admission, seen on December 11 for dysphagia.  During admission she developed dysphagia with taking medication.  She felt like it got stuck in the back of her throat.  Drank a lot of  water, felt like it finally passed.  Denied this being a chronic issue for her.  Complained of heartburn, persistent epigastric pain.  No chronic NSAID use.  No prior EGD.  No prior colonoscopy.  Recommended continue PPI, monitor symptoms.  Return to the office for follow-up to consider screening colonoscopy.  Patient was admitted December 20 through 26 with lower abdominal pain, partial small bowel obstruction versus ileus.  She went to surgery for right inguinal herniorrhaphy,was noted to have incarcerated small bowel attached to the hernia sac.  She had 19 cm of small bowel removed.  Patient readmitted December 31 through January 4 with swelling and erythema at the incision site on the right lower quadrant.  Required I&D at bedside.  Cultures grew E. coli.  Outpatient wound care [facility].  Today: Presents with formal interpretor. She continues to have epigastric burning, feels full, nausea. No longer vomiting. She feels hungry but when she tries to eat she gets full early and then nausea starts. She has lost 27 pounds since the onset of her illness back in 01/2021. She continues to have heartburn. Complains of dysphagia to solid food. No melena, brbpr. BM daily but stools are more loose. No constipation. Currently completing antibiotics for wound infection. She is not taking pantoprazole, stating it made her feel worse. Does not require pain medication anymore.     February 02, 2021: 147 pounds February 07, 2021: 137 pounds March 10, 2021: 134 pounds Today: 119.6 pounds  Normal Hgb 02/28/21. Hgb at time of discharge 12/26 was  9.0. Hgb 8.6 03/13/21.   Current Outpatient Medications  Medication Sig Dispense Refill   HYDROcodone-acetaminophen (NORCO) 5-325 MG tablet Take 1 tablet by mouth every 4 (four) hours as needed for moderate pain. 30 tablet 0          sulfamethoxazole-trimethoprim (BACTRIM DS) 800-160 MG tablet Take 1 tablet by mouth 2 (two) times daily. 20 tablet 0    No current facility-administered medications for this visit.    Allergies as of 03/19/2021   (No Known Allergies)    Past Medical History:  Diagnosis Date   Acute cholecystitis 02/02/2021   GERD without esophagitis 11/27/2020   Obesity, unspecified 02/15/2015    Past Surgical History:  Procedure Laterality Date   BOWEL RESECTION N/A 03/02/2021   Procedure: PARTIAL SMALL BOWEL RESECTION;  Surgeon: Aviva Signs, MD;  Location: [facility];  Service: General;  Laterality: N/A;   CHOLECYSTECTOMY N/A 02/03/2021   Procedure: LAPAROSCOPIC CHOLECYSTECTOMY;  Surgeon: Aviva Signs, MD;  Location: [facility];  Service: General;  Laterality: N/A;   INGUINAL HERNIA REPAIR Right 03/02/2021   Procedure: HERNIA REPAIR INGUINAL ADULT;  Surgeon: Aviva Signs, MD;  Location: [facility];  Service: General;  Laterality: Right;    Family History  Problem Relation Age of Onset   Diabetes Sister    Cancer Brother     Social History   Socioeconomic History   Marital status: Single    Spouse name: Not on file   Number of children: Not on file   Years of education: Not on file   Highest education level: Not on file  Occupational History   Not on file  Tobacco Use   Smoking status: Never   Smokeless tobacco: Never  Substance and Sexual Activity   Alcohol use: No   Drug use: No   Sexual activity: Not on file  Other Topics Concern   Not on file  Social History Narrative   ** Merged History Encounter **       Social Determinants of Health   Financial Resource Strain: Not on file  Food Insecurity: Not on file  Transportation Needs: Not on file  Physical Activity: Not on file  Stress: Not on file  Social Connections: Not on file  Intimate Partner Violence: Not on file      ROS:  General: Negative for anorexia, fever, chills, fatigue, +weakness. See hpi Eyes: Negative for vision changes.  ENT: Negative for hoarseness, difficulty swallowing , nasal congestion. CV: Negative for chest pain,  angina, palpitations, dyspnea on exertion, peripheral edema.  Respiratory: Negative for dyspnea at rest, dyspnea on exertion, cough, sputum, wheezing.  GI: See history of present illness. GU:  Negative for dysuria, hematuria, urinary incontinence, urinary frequency, nocturnal urination.  MS: Negative for joint pain, low back pain.  Derm: Negative for rash or itching.  Neuro: Negative for weakness, abnormal sensation, seizure, frequent headaches, memory loss, confusion.  Psych: Negative for anxiety, depression, suicidal ideation, hallucinations.  Endo: see hpi Heme: Negative for bruising or bleeding. Allergy: Negative for rash or hives.    Physical Examination:  BP 109/64    Pulse 82    Temp (!) 96.8 F (36 C) (Temporal)    Ht 4\' 11"  (1.499 m)    Wt 119 lb 9.6 oz (54.3 kg)    LMP 03/11/2008 (Approximate)    BMI 24.16 kg/m    General: Well-nourished, well-developed in no acute distress.  Head: Normocephalic, atraumatic.   Eyes: Conjunctiva pink, no icterus. Mouth: masked. Neck: Supple without thyromegaly, masses,  or lymphadenopathy.  Lungs: Clear to auscultation bilaterally.  Heart: Regular rate and rhythm, no murmurs rubs or gallops.  Abdomen: Bowel sounds are normal, nontender, nondistended, no hepatosplenomegaly or masses, no abdominal bruits or    hernia , no rebound or guarding.  Lower abdomen, dressed/bandaged. Rectal: not performed Extremities: No lower extremity edema. No clubbing or deformities.  Neuro: Alert and oriented x 4 , grossly normal neurologically.  Skin: Warm and dry, no rash or jaundice.   Psych: Alert and cooperative, normal mood and affect.  Labs: Lab Results  Component Value Date   CREATININE 0.36 (L) 03/14/2021   BUN <5 (L) 03/13/2021   NA 136 03/13/2021   K 4.3 03/13/2021   CL 106 03/13/2021   CO2 24 03/13/2021   Lab Results  Component Value Date   ALT 36 03/10/2021   AST 34 03/10/2021   ALKPHOS 109 03/10/2021   BILITOT 0.4 03/10/2021   Lab  Results  Component Value Date   WBC 5.2 03/13/2021   HGB 8.6 (L) 03/13/2021   HCT 26.7 (L) 03/13/2021   MCV 90.2 03/13/2021   PLT 348 03/13/2021   No results found for: IRON, TIBC, FERRITIN No results found for: VITAMINB12 No results found for: FOLATE Lab Results  Component Value Date   LIPASE 57 (H) 02/28/2021   Lipase at its highest of 157 on February 09, 2021.  Lab Results  Component Value Date   INR 1.3 (H) 03/10/2021     Imaging Studies: CT ABDOMEN PELVIS W CONTRAST  Result Date: 03/13/2021 CLINICAL DATA:  Wound infection; previous cholecystectomy, hernia repair, bowel resection EXAM: CT ABDOMEN AND PELVIS WITH CONTRAST TECHNIQUE: Multidetector CT imaging of the abdomen and pelvis was performed using the standard protocol following bolus administration of intravenous contrast. CONTRAST:  20mL OMNIPAQUE IOHEXOL 300 MG/ML  SOLN COMPARISON:  03/10/2021 and previous FINDINGS: Lower chest: Trace left pleural effusion persists. Dependent atelectasis posteriorly at the left lung base. Persistent linear scarring or atelectasis in both lower lobes. Hepatobiliary: No focal liver abnormality is seen. Status post cholecystectomy. No biliary dilatation. Pancreas: Unremarkable. No pancreatic ductal dilatation or surrounding inflammatory changes. Spleen: Normal in size without focal abnormality. Adrenals/Urinary Tract: Adrenal glands are unremarkable. Kidneys are normal, without renal calculi, focal lesion, or hydronephrosis. Bladder is unremarkable. Stomach/Bowel: Stomach is incompletely distended, unremarkable. Small bowel decompressed. Anastomotic staple line in the right lower quadrant. Incomplete distal passage of oral contrast material. Terminal ileum unremarkable. Normal appendix. The colon is nondilated, unremarkable. Vascular/Lymphatic: Scattered aortoiliac calcified atheromatous plaque without aneurysm or evident stenosis. Portal vein patent. No abdominal or pelvic adenopathy localized.  Reproductive: Status post hysterectomy. No adnexal masses. Stable 7 mm coarse left ovarian calcification. Other: No free air.  No ascites. Musculoskeletal: Interval removal of skin staples overlying the right inguinal region with resolution of the previously noted deep subcutaneous fluid collection. Interval decrease in size of residual loculated fluid overlying the right inguinal ligament, 2.4 x 1.1 cm maximum transverse dimensions (previously 6 x 3.1). No new or enlarging collections. Degenerative disc disease L5-S1. No fracture or worrisome bone lesion. IMPRESSION: 1. Interval resolution of right lower pelvic subcutaneous fluid collection, and residual 2.4 cm collection overlying the right inguinal ligament (previously 6 cm). 2. No evidence of  obstruction. Electronically Signed   By: Corlis Leak M.D.   On: 03/13/2021 13:54   CT ABDOMEN PELVIS W CONTRAST  Result Date: 03/10/2021 CLINICAL DATA:  Postop. On fluid collection. Abdominal pain. Recent hernia surgery. EXAM: CT ABDOMEN AND PELVIS  WITH CONTRAST TECHNIQUE: Multidetector CT imaging of the abdomen and pelvis was performed using the standard protocol following bolus administration of intravenous contrast. CONTRAST:  15mL OMNIPAQUE IOHEXOL 300 MG/ML  SOLN COMPARISON:  02/15/2021. FINDINGS: Lower chest: Trace left effusion. Mild dependent lower lobe atelectasis. Hepatobiliary: Liver normal size. Stable small low-density lesion adjacent to the falciform ligament. No liver abnormalities. Status post cholecystectomy. No bile duct dilation. Pancreas: Unremarkable. No pancreatic ductal dilatation or surrounding inflammatory changes. Spleen: Normal in size without focal abnormality. Adrenals/Urinary Tract: Adrenal glands are unremarkable. Kidneys are normal, without renal calculi, focal lesion, or hydronephrosis. Bladder is unremarkable. Stomach/Bowel: New small bowel anastomosis in the right lower quadrant. Adjacent small bowel loops show mild wall thickening.  There is a contiguous collection in the anterior, right pelvis, measuring 6 x 3.1 x 5.8 cm. Remainder the small bowel is unremarkable, normal in caliber with no other areas of wall thickening. Colon is normal in caliber with no wall thickening or adjacent inflammation. Vascular/Lymphatic: Aortic atherosclerosis. No aneurysm. No enlarged lymph nodes. Reproductive: Uterus and bilateral adnexa are unremarkable. Other: Fluid collection in the subcutaneous soft tissues of the right lower quadrant, along the right lower quadrant incision. Collection has ill-defined margins. It measures approximately, 8.5 x 4 x 5.2 cm. There is adjacent inflammatory stranding. This collection may communicate with the adjacent collection anterior right pelvic peritoneal cavity. Musculoskeletal: No fracture or acute finding.  No bone lesion. IMPRESSION: 1. Two adjacent postop collections, 1 in the subcutaneous soft tissues of the right lower quadrant along the incision, 8.5 x 4 x 5.2 cm in size, the other below this, in the right anterior pelvis peritoneal cavity measuring 6 x 3.1 x 5.8 cm. There associated inflammatory changes mostly in right lower quadrant anterior abdominal wall fat. 2. Small bowel anastomosis in the right lower quadrant adjacent to the pelvic collection, associated with mild small bowel wall thickening. 3. No evidence of bowel obstruction.  No free intraperitoneal air. Electronically Signed   By: Lajean Manes M.D.   On: 03/10/2021 17:39   DG Chest Port 1 View  Result Date: 03/10/2021 CLINICAL DATA:  Questionable sepsis. EXAM: PORTABLE CHEST 1 VIEW COMPARISON:  Chest x-ray with ribs 10/07/2011. FINDINGS: There is a band of opacity in the right lower lung. The lungs are otherwise clear. There is no pleural effusion or pneumothorax. The cardiomediastinal silhouette is within normal limits. No acute fractures are seen. There are surgical clips in the right abdomen. IMPRESSION: 1. Band of opacity in the right lower lung  favored is atelectasis. Infection can not be entirely excluded. Followup PA and lateral chest X-ray is recommended in 3-4 weeks following trial of antibiotic therapy to ensure resolution and exclude underlying malignancy. Electronically Signed   By: Ronney Asters M.D.   On: 03/10/2021 16:56   DG ABD ACUTE 2+V W 1V CHEST  Result Date: 02/27/2021 CLINICAL DATA:  Nausea, vomiting EXAM: DG ABDOMEN ACUTE WITH 1 VIEW CHEST COMPARISON:  02/17/2021 FINDINGS: Continued small bowel dilatation concerning for distal small bowel obstruction. Little colonic gas seen. Prior cholecystectomy. No free air organomegaly. Heart and mediastinal contours are within normal limits. No focal opacities or effusions. No acute bony abnormality. IMPRESSION: Continued small bowel dilatation concerning for small bowel obstruction. No acute cardiopulmonary disease. Electronically Signed   By: Rolm Baptise M.D.   On: 02/27/2021 11:21     Assessment:  61 y/o Hispanic female presenting for hospital follow up. Recent complicated history as outlined, 5 admissions since February 02, 2021.  Seen while inpatient 02/2021 for acute onset dysphagia. Was having epigastric pain and nausea at that time felt to be secondary to GERD and small bowel obstruction. Treated with PPI. Presents for follow up to reassess her dysphagia and consideration for screening colonoscopy.   UGI symptoms: Overall patient is slow to recover. Her appetite is good but she has significant early satiety, and develops nausea with meal which limits her oral intake. Total weight loss of 27 pounds since before her gallbladder surgery. She continues to have heartburn on regular basis. No longer on pantoprazole, stating it seemed to make her feel worse. Continues to have dysphagia mostly to solid foods but she is not sure if food is sticking or actually coming back up. Epigastric burning persistent. Her BMs are regular now.   Etiology of ongoing symptoms remains unclear. Initially  it was thought much of her symptoms were related to recovering from gallbladder surgery and then from right inguinal hernia with strangulated small bowel/SBO. Then returned with wound infection, still on antibiotics and receiving wound care three times a week. Her symptoms may improve with more recovering time but it remains unclear. She could have uncontrolled reflux which is contributing to her dysphagia, nausea. Early satiety may be secondary to delayed gastric emptying brought on by several surgeries and ongoing illness. Cannot exclude gastritis, ulcers contributing.   Discussed at length with patient. Could offer EGD now. At minimum, recommend PPI daily with short interval follow up. If no significant improvement, then plan for EGD at that time.   Anemia:  noted postoperatively. Will update labs after next visit.   Screening colonoscopy: would postpone given ongoing illness. Unlikely to do well with bowel prep.  Plan: Omeprazole 40mg  daily before breakfast. Sent RX to [facility]. She will fill out [employer] application through [facility]. Return office visit in 3 weeks. Update CBC at that time.

## 2021-03-19 NOTE — Telephone Encounter (Signed)
Pt did not show for appointment. Unable to reach by all numbers given; will need a better contact number when patient returns for next visit.  Lurena Nida, PTA/CLT, Margarita Rana [phone]

## 2021-03-21 ENCOUNTER — Ambulatory Visit (HOSPITAL_COMMUNITY): Payer: 59 | Attending: Physician Assistant | Admitting: Physical Therapy

## 2021-03-21 ENCOUNTER — Other Ambulatory Visit: Payer: Self-pay

## 2021-03-21 DIAGNOSIS — R1031 Right lower quadrant pain: Secondary | ICD-10-CM | POA: Diagnosis not present

## 2021-03-21 DIAGNOSIS — T8130XA Disruption of wound, unspecified, initial encounter: Secondary | ICD-10-CM | POA: Insufficient documentation

## 2021-03-21 DIAGNOSIS — T81321A Disruption or dehiscence of closure of internal operation (surgical) wound of abdominal wall muscle or fascia, initial encounter: Secondary | ICD-10-CM

## 2021-03-21 NOTE — Therapy (Signed)
[facility] [address] [place], [place], [address] Phone: [phone]   Fax:  [phone]  Wound Care Therapy  Patient Details  Name: Janice Wells MRN: [id number] Date of Birth: [birth year] No data recorded  Encounter Date: 03/21/2021   PT End of Session - 03/21/21 1109     Visit Number 2    Number of Visits 18    Date for PT Re-Evaluation 04/27/21    Authorization Type pending medicaid    PT Start Time 0930    PT Stop Time 1010    PT Time Calculation (min) 40 min    Activity Tolerance Patient tolerated treatment well             Past Medical History:  Diagnosis Date   Acute cholecystitis 02/02/2021   GERD without esophagitis 11/27/2020   Obesity, unspecified 02/15/2015    Past Surgical History:  Procedure Laterality Date   BOWEL RESECTION N/A 03/02/2021   Procedure: PARTIAL SMALL BOWEL RESECTION;  Surgeon: Franky Macho, MD;  Location: [facility];  Service: General;  Laterality: N/A;   CHOLECYSTECTOMY N/A 02/03/2021   Procedure: LAPAROSCOPIC CHOLECYSTECTOMY;  Surgeon: Franky Macho, MD;  Location: [facility];  Service: General;  Laterality: N/A;   INGUINAL HERNIA REPAIR Right 03/02/2021   Procedure: HERNIA REPAIR INGUINAL ADULT;  Surgeon: Franky Macho, MD;  Location: [facility];  Service: General;  Laterality: Right;    There were no vitals filed for this visit.               Wound Therapy - 03/21/21 1101     Subjective pt accompanied by interpreter; states she arrived late last visit due to running over with appointment at hospitial and was unable to be seen.  Dressing has not been changed in 5 days.    Patient and Family Stated Goals wound to heal    Date of Onset 03/10/21    Prior Treatments hospitalized with IV antibiotics and dressing chnge    Pain Scale 0-10    Pain Score 5     Pain Type Acute pain    Pain Location Abdomen    Pain Orientation Right    Evaluation and Treatment Procedures  Explained to Patient/Family Yes    Evaluation and Treatment Procedures agreed to    Incision Properties Date First Assessed: 03/02/21 Time First Assessed: 1037 Location: Abdomen Location Orientation: Other (Comment)   Dressing Type Abdominal pads;Hydrogel    Dressing Dry    Dressing Change Frequency Monday, Wednesday, Friday    Margins Unattached edges (unapproximated)    Closure None    Drainage Amount Minimal    Drainage Description Serosanguineous    Treatment Cleansed;Other (Comment)    Wound Therapy - Clinical Statement Pt's packing very dry and adhered into woundbed.  Soaked with saline to assist in removal, however still very stuck and increased bleeding and discomfort upon removal.  Irrigated wound well following and used 2 containers of hydrogel along with repacking with 2.5" saline soaked conform.  Wound overall appears to be clean without slough visible in wound bed.  Vaseline applied to perimeters to keep dressing from sticking as well.    Factors Delaying/Impairing Wound Healing Infection - systemic/local    Wound Therapy - Frequency 3X / week   6 weeks   Wound Therapy - Current Recommendations PT    Wound Plan irrigation and dressing changes    Dressing  wound packed with 3 " kerlix soaked with saline and hydrogel f/b abd pad  and tape to secure.                       PT Short Term Goals - 03/16/21 1223       PT SHORT TERM GOAL #1   Title PT wound depth to be no greater than 2.5 cm    Time 3    Period Weeks    Status New    Target Date 04/06/21      PT SHORT TERM GOAL #2   Title Pt pain to be no greater than a 3/10 to allow pt to feel up to completing ADL's    Time 3    Period Weeks               PT Long Term Goals - 03/16/21 1224       PT LONG TERM GOAL #1   Title Pt wound to have no depth and be no greater than 2 x .5 cm for pt to feel confident in self care techniques.    Time 6    Period Weeks    Status New    Target Date 04/27/21       PT LONG TERM GOAL #2   Title PT pain to be no greater than a 1/10    Time 6    Period Weeks    Status New                    Patient will benefit from skilled therapeutic intervention in order to improve the following deficits and impairments:     Visit Diagnosis: Abdominal wound dehiscence, initial encounter  Right lower quadrant abdominal pain     Problem List Patient Active Problem List   Diagnosis Date Noted   Dysphagia 03/19/2021   Early satiety 03/19/2021   Abdominal pain, epigastric 03/19/2021   Wound infection after surgery 03/10/2021   Incarcerated right inguinal hernia    Ileus following gastrointestinal surgery (HCC) 02/27/2021   Oropharyngeal dysphagia    Constipation 02/16/2021   Non-English speaking patient 02/16/2021   Right inguinal hernia    Generalized abdominal pain 02/07/2021   Hypokalemia 02/07/2021   Intractable nausea and vomiting 02/07/2021   SBO (small bowel obstruction) (HCC) 02/06/2021   GERD (gastroesophageal reflux disease) 11/27/2020   Obesity, unspecified 02/15/2015   Lurena Nida, PTA/CLT, WTA [phone]  Lurena Nida, PTA 03/21/2021, 11:11 AM  [facility] [address] [place], [place], [address] Phone: [phone]   Fax:  [phone]  Name: Janice Wells MRN: [id number] Date of Birth: 01/11/1961

## 2021-03-23 ENCOUNTER — Encounter (HOSPITAL_COMMUNITY): Payer: Self-pay | Admitting: Physical Therapy

## 2021-03-23 ENCOUNTER — Other Ambulatory Visit: Payer: Self-pay

## 2021-03-23 ENCOUNTER — Ambulatory Visit (HOSPITAL_COMMUNITY): Payer: 59 | Admitting: Physical Therapy

## 2021-03-23 DIAGNOSIS — R1031 Right lower quadrant pain: Secondary | ICD-10-CM | POA: Diagnosis not present

## 2021-03-23 DIAGNOSIS — T8130XA Disruption of wound, unspecified, initial encounter: Secondary | ICD-10-CM

## 2021-03-23 NOTE — Therapy (Signed)
[facility] [address] [place], [place], [address] Phone: [phone]   Fax:  [phone]  Wound Care Therapy  Patient Details  Name: Janice Wells MRN: [id number] Date of Birth: 12-Jun-1960 No data recorded  Encounter Date: 03/23/2021   PT End of Session - 03/23/21 1610     Visit Number 3    Number of Visits 18    Date for PT Re-Evaluation 04/27/21    Authorization Type pending medicaid    PT Start Time 1530    PT Stop Time 1600    PT Time Calculation (min) 30 min    Activity Tolerance Patient tolerated treatment well    Behavior During Therapy [facility] for tasks assessed/performed             Past Medical History:  Diagnosis Date   Acute cholecystitis 02/02/2021   GERD without esophagitis 11/27/2020   Obesity, unspecified 02/15/2015    Past Surgical History:  Procedure Laterality Date   BOWEL RESECTION N/A 03/02/2021   Procedure: PARTIAL SMALL BOWEL RESECTION;  Surgeon: Franky Macho, MD;  Location: [facility];  Service: General;  Laterality: N/A;   CHOLECYSTECTOMY N/A 02/03/2021   Procedure: LAPAROSCOPIC CHOLECYSTECTOMY;  Surgeon: Franky Macho, MD;  Location: [facility];  Service: General;  Laterality: N/A;   INGUINAL HERNIA REPAIR Right 03/02/2021   Procedure: HERNIA REPAIR INGUINAL ADULT;  Surgeon: Franky Macho, MD;  Location: [facility];  Service: General;  Laterality: Right;    There were no vitals filed for this visit.               Wound Therapy - 03/23/21 0001     Subjective Pt arrives with interpreter,    Patient and Family Stated Goals wound to heal    Date of Onset 03/10/21    Prior Treatments hospitalized with IV antibiotics and dressing chnge    Pain Scale 0-10    Pain Score 2     Pain Type Acute pain    Pain Location Abdomen    Pain Orientation Right    Evaluation and Treatment Procedures Explained to Patient/Family Yes    Evaluation and Treatment Procedures agreed to    Incision  Properties Date First Assessed: 03/02/21 Time First Assessed: 1037 Location: Abdomen Location Orientation: Other (Comment)   Dressing Type Gauze (Comment);Hydrogel    Dressing Old drainage (marked)    Dressing Change Frequency Monday, Wednesday, Friday    Site / Wound Assessment Granulation tissue    Incision Length (cm) --   depth 4.0   Margins Unattached edges (unapproximated)    Closure None    Drainage Amount Minimal    Drainage Description Serosanguineous    Treatment Cleansed    Wound Therapy - Clinical Statement Pt recieved wound irrigation followed by repacking with 2." conform soaked in saline with hydrogel f/b ab pad.    Factors Delaying/Impairing Wound Healing Infection - systemic/local    Wound Therapy - Frequency 3X / week    Wound Therapy - Current Recommendations PT    Wound Plan irrigation and dressing changes    Dressing  wound packed with 2" conform soaked with saline and hydrogel f/b abd pad and tape to secure.                     PT Education - 03/23/21 1609     Education Details Pt questioned about showering  stressed to pt that she can not shower she needs to keep the dressing dry.  Person(s) Educated Patient   interpreter   Methods Explanation    Comprehension Verbalized understanding              PT Short Term Goals - 03/23/21 1611       PT SHORT TERM GOAL #1   Title PT wound depth to be no greater than 2.5 cm    Time 3    Period Weeks    Status On-going    Target Date 04/06/21      PT SHORT TERM GOAL #2   Title Pt pain to be no greater than a 3/10 to allow pt to feel up to completing ADL's    Time 3    Period Weeks    Status Achieved               PT Long Term Goals - 03/23/21 1611       PT LONG TERM GOAL #1   Title Pt wound to have no depth and be no greater than 2 x .5 cm for pt to feel confident in self care techniques.    Time 6    Period Weeks    Status On-going    Target Date 04/27/21      PT LONG TERM GOAL  #2   Title PT pain to be no greater than a 1/10    Time 6    Period Weeks    Status On-going                   Plan - 03/23/21 1611     Clinical Impression Statement see above    Personal Factors and Comorbidities Past/Current Experience    Examination-Activity Limitations Bathing;Dressing    Stability/Clinical Decision Making Stable/Uncomplicated    Rehab Potential Good    PT Frequency 3x / week    PT Duration 6 weeks    PT Treatment/Interventions Other (comment)   wound care   PT Next Visit Plan debride if necessary; wet to moist dressing changes    Consulted and Agree with Plan of Care Patient             Patient will benefit from skilled therapeutic intervention in order to improve the following deficits and impairments:  Decreased skin integrity, Pain  Visit Diagnosis: Abdominal wound dehiscence, initial encounter  Right lower quadrant abdominal pain     Problem List Patient Active Problem List   Diagnosis Date Noted   Dysphagia 03/19/2021   Early satiety 03/19/2021   Abdominal pain, epigastric 03/19/2021   Wound infection after surgery 03/10/2021   Incarcerated right inguinal hernia    Ileus following gastrointestinal surgery (HCC) 02/27/2021   Oropharyngeal dysphagia    Constipation 02/16/2021   Non-English speaking patient 02/16/2021   Right inguinal hernia    Generalized abdominal pain 02/07/2021   Hypokalemia 02/07/2021   Intractable nausea and vomiting 02/07/2021   SBO (small bowel obstruction) (HCC) 02/06/2021   GERD (gastroesophageal reflux disease) 11/27/2020   Obesity, unspecified 02/15/2015   [name], PT CLT [phone]  03/23/2021, 4:12 PM  [facility] [address] [place], [place], [address] Phone: [phone]   Fax:  [phone]  Name: Janice Wells MRN: [id number] Date of Birth: [birth year]

## 2021-03-26 ENCOUNTER — Encounter (HOSPITAL_COMMUNITY): Payer: Self-pay | Admitting: Physical Therapy

## 2021-03-26 ENCOUNTER — Ambulatory Visit (HOSPITAL_COMMUNITY): Payer: 59 | Admitting: Physical Therapy

## 2021-03-26 ENCOUNTER — Other Ambulatory Visit: Payer: Self-pay

## 2021-03-26 DIAGNOSIS — R1031 Right lower quadrant pain: Secondary | ICD-10-CM

## 2021-03-26 DIAGNOSIS — T8130XA Disruption of wound, unspecified, initial encounter: Secondary | ICD-10-CM | POA: Diagnosis not present

## 2021-03-26 NOTE — Therapy (Signed)
[facility] [facility] [address] [place], [place], [address] Phone: [phone]   Fax:  [phone]  Wound Care Therapy  Patient Details  Name: Janice Wells MRN: [id number] Date of Birth: 1961/01/23 No data recorded  Encounter Date: 03/26/2021   PT End of Session - 03/26/21 1211     Visit Number 4    Number of Visits 18    Date for PT Re-Evaluation 04/27/21    Authorization Type pending medicaid    PT Start Time 1130    PT Stop Time 1200    PT Time Calculation (min) 30 min    Activity Tolerance Patient tolerated treatment well    Behavior During Therapy [facility] for tasks assessed/performed             Past Medical History:  Diagnosis Date   Acute cholecystitis 02/02/2021   GERD without esophagitis 11/27/2020   Obesity, unspecified 02/15/2015    Past Surgical History:  Procedure Laterality Date   BOWEL RESECTION N/A 03/02/2021   Procedure: PARTIAL SMALL BOWEL RESECTION;  Surgeon: Franky Macho, MD;  Location: [facility];  Service: General;  Laterality: N/A;   CHOLECYSTECTOMY N/A 02/03/2021   Procedure: LAPAROSCOPIC CHOLECYSTECTOMY;  Surgeon: Franky Macho, MD;  Location: [facility];  Service: General;  Laterality: N/A;   INGUINAL HERNIA REPAIR Right 03/02/2021   Procedure: HERNIA REPAIR INGUINAL ADULT;  Surgeon: Franky Macho, MD;  Location: [facility];  Service: General;  Laterality: Right;    There were no vitals filed for this visit.               Wound Therapy - 03/26/21 0001     Subjective PT comes to department with interpreter.  Has no pain until packing is being removed.    Patient and Family Stated Goals wound to heal    Date of Onset 03/10/21    Prior Treatments hospitalized with IV antibiotics and dressing chnge    Pain Scale 0-10    Pain Score 0-No pain    Evaluation and Treatment Procedures Explained to Patient/Family Yes    Evaluation and Treatment Procedures agreed to    Incision Properties Date First  Assessed: 03/02/21 Time First Assessed: 1037 Location: Abdomen Location Orientation: Other (Comment)   Dressing Type Abdominal pads;Gauze (Comment)    Dressing Old drainage (marked)    Dressing Change Frequency Monday, Wednesday, Friday    Site / Wound Assessment Granulation tissue    Wound Therapy - Clinical Statement Pt recieved wound irrigation followed by repacking with 2." conform soaked in saline with hydrogel f/b ab pad. Secured with medipore tape.   Factors Delaying/Impairing Wound Healing Infection - systemic/local    Wound Therapy - Frequency 3X / week    Wound Therapy - Current Recommendations PT    Wound Plan irrigation no debridement needed    Dressing  wound packed with 2" conform soaked with saline and hydrogel f/b abd pad and tape to secure.                       PT Short Term Goals - 03/23/21 1611       PT SHORT TERM GOAL #1   Title PT wound depth to be no greater than 2.5 cm    Time 3    Period Weeks    Status On-going    Target Date 04/06/21      PT SHORT TERM GOAL #2   Title Pt pain to be no greater than a  3/10 to allow pt to feel up to completing ADL's    Time 3    Period Weeks    Status Achieved               PT Long Term Goals - 03/23/21 1611       PT LONG TERM GOAL #1   Title Pt wound to have no depth and be no greater than 2 x .5 cm for pt to feel confident in self care techniques.    Time 6    Period Weeks    Status On-going    Target Date 04/27/21      PT LONG TERM GOAL #2   Title PT pain to be no greater than a 1/10    Time 6    Period Weeks    Status On-going                   Plan - 03/26/21 1211     Clinical Impression Statement see above    Personal Factors and Comorbidities Past/Current Experience    Examination-Activity Limitations Bathing;Dressing    Stability/Clinical Decision Making Stable/Uncomplicated    Rehab Potential Good    PT Frequency 3x / week    PT Duration 6 weeks    PT  Treatment/Interventions Other (comment)   wound care   PT Next Visit Plan debride if necessary; wet to moist dressing changes    Consulted and Agree with Plan of Care Patient             Patient will benefit from skilled therapeutic intervention in order to improve the following deficits and impairments:  Decreased skin integrity, Pain  Visit Diagnosis: Abdominal wound dehiscence, initial encounter  Right lower quadrant abdominal pain     Problem List Patient Active Problem List   Diagnosis Date Noted   Dysphagia 03/19/2021   Early satiety 03/19/2021   Abdominal pain, epigastric 03/19/2021   Wound infection after surgery 03/10/2021   Incarcerated right inguinal hernia    Ileus following gastrointestinal surgery (HCC) 02/27/2021   Oropharyngeal dysphagia    Constipation 02/16/2021   Non-English speaking patient 02/16/2021   Right inguinal hernia    Generalized abdominal pain 02/07/2021   Hypokalemia 02/07/2021   Intractable nausea and vomiting 02/07/2021   SBO (small bowel obstruction) (HCC) 02/06/2021   GERD (gastroesophageal reflux disease) 11/27/2020   Obesity, unspecified 02/15/2015   [name], PT CLT [phone]  03/26/2021, 12:12 PM  [facility] [address] [place], [place], [address] Phone: [phone]   Fax:  [phone]  Name: Janice Wells MRN: [id number] Date of Birth: Aug 09, 1960

## 2021-03-28 ENCOUNTER — Ambulatory Visit (HOSPITAL_COMMUNITY): Payer: 59 | Admitting: Physical Therapy

## 2021-03-28 ENCOUNTER — Other Ambulatory Visit: Payer: Self-pay

## 2021-03-28 DIAGNOSIS — R1031 Right lower quadrant pain: Secondary | ICD-10-CM

## 2021-03-28 DIAGNOSIS — T8130XA Disruption of wound, unspecified, initial encounter: Secondary | ICD-10-CM

## 2021-03-28 NOTE — Therapy (Signed)
[place], [place], [address] Phone: [phone]   Fax:  [phone]  Wound Care Therapy  Patient Details  Name: Janice Wells MRN: [id number] Date of Birth: Apr 19, 1960 No data recorded  Encounter Date: 03/28/2021   PT End of Session - 03/28/21 1209     Visit Number 5    Number of Visits 18    Date for PT Re-Evaluation 04/27/21    Authorization Type pending medicaid    PT Start Time 1140    PT Stop Time 1205    PT Time Calculation (min) 25 min    Activity Tolerance Patient tolerated treatment well    Behavior During Therapy [facility] for tasks assessed/performed             Past Medical History:  Diagnosis Date   Acute cholecystitis 02/02/2021   GERD without esophagitis 11/27/2020   Obesity, unspecified 02/15/2015    Past Surgical History:  Procedure Laterality Date   BOWEL RESECTION N/A 03/02/2021   Procedure: PARTIAL SMALL BOWEL RESECTION;  Surgeon: Aviva Signs, MD;  Location: [facility];  Service: General;  Laterality: N/A;   CHOLECYSTECTOMY N/A 02/03/2021   Procedure: LAPAROSCOPIC CHOLECYSTECTOMY;  Surgeon: Aviva Signs, MD;  Location: [facility];  Service: General;  Laterality: N/A;   INGUINAL HERNIA REPAIR Right 03/02/2021   Procedure: HERNIA REPAIR INGUINAL ADULT;  Surgeon: Aviva Signs, MD;  Location: [facility];  Service: General;  Laterality: Right;    There were no vitals filed for this visit.               Wound Therapy - 03/28/21 1201     Subjective PT comes to department with interpreter.  Has no pain until packing is being removed.    Patient and Family Stated Goals wound to heal    Date of Onset 03/10/21    Prior Treatments hospitalized with IV antibiotics and dressing chnge    Pain Scale 0-10    Pain Score 0-No pain    Evaluation and Treatment Procedures Explained to Patient/Family Yes    Evaluation and Treatment Procedures agreed to    Incision Properties Date First  Assessed: 03/02/21 Time First Assessed: 1037 Location: Abdomen Location Orientation: Other (Comment)   Dressing Type Abdominal pads;Hydrogel    Dressing Old drainage (marked)    Dressing Change Frequency Monday, Wednesday, Friday    Site / Wound Assessment Granulation tissue    Incision Length (cm) 6.5 cm   depth 3.6 cm   Margins Unattached edges (unapproximated)    Closure None    Drainage Amount Minimal    Drainage Description Serosanguineous    Treatment Cleansed    Wound Therapy - Clinical Statement dressing remains adherent with increased time and application of saline to loosen and remove with less pain.  Irrigated with bulb syringe X 4 times with pressure to irrigate well.  Packed with 2." conform soaked in saline with hydrogel.  Vaseline applied to perimeter to reduce adherence f/b ab pad.  wound measured with overall reduction of incision size and depth.    Factors Delaying/Impairing Wound Healing Infection - systemic/local    Wound Therapy - Frequency 3X / week    Wound Therapy - Current Recommendations PT    Wound Plan irrigation no debridement needed    Dressing  wound packed with 2" conform soaked with saline and hydrogel f/b abd pad and tape to secure.  PT Short Term Goals - 03/23/21 1611       PT SHORT TERM GOAL #1   Title PT wound depth to be no greater than 2.5 cm    Time 3    Period Weeks    Status On-going    Target Date 04/06/21      PT SHORT TERM GOAL #2   Title Pt pain to be no greater than a 3/10 to allow pt to feel up to completing ADL's    Time 3    Period Weeks    Status Achieved               PT Long Term Goals - 03/23/21 1611       PT LONG TERM GOAL #1   Title Pt wound to have no depth and be no greater than 2 x .5 cm for pt to feel confident in self care techniques.    Time 6    Period Weeks    Status On-going    Target Date 04/27/21      PT LONG TERM GOAL #2   Title PT pain to be no greater than a  1/10    Time 6    Period Weeks    Status On-going                    Patient will benefit from skilled therapeutic intervention in order to improve the following deficits and impairments:     Visit Diagnosis: Abdominal wound dehiscence, initial encounter  Right lower quadrant abdominal pain     Problem List Patient Active Problem List   Diagnosis Date Noted   Dysphagia 03/19/2021   Early satiety 03/19/2021   Abdominal pain, epigastric 03/19/2021   Wound infection after surgery 03/10/2021   Incarcerated right inguinal hernia    Ileus following gastrointestinal surgery ([place]) 02/27/2021   Oropharyngeal dysphagia    Constipation 02/16/2021   Non-English speaking patient 02/16/2021   Right inguinal hernia    Generalized abdominal pain 02/07/2021   Hypokalemia 02/07/2021   Intractable nausea and vomiting 02/07/2021   SBO (small bowel obstruction) ([place]) 02/06/2021   GERD (gastroesophageal reflux disease) 11/27/2020   Obesity, unspecified 02/15/2015   Teena Irani, PTA/CLT, WTA [phone]  Teena Irani, PTA 03/28/2021, 12:10 PM  [place], [place], [address] Phone: [phone]   Fax:  [phone]  Name: Maghen Seivert MRN: [id number] Date of Birth: 1960/11/12

## 2021-03-29 ENCOUNTER — Encounter: Payer: Self-pay | Admitting: Surgery

## 2021-03-29 ENCOUNTER — Ambulatory Visit (INDEPENDENT_AMBULATORY_CARE_PROVIDER_SITE_OTHER): Payer: Self-pay | Admitting: Surgery

## 2021-03-29 VITALS — BP 108/74 | HR 89 | Temp 98.4°F | Resp 12 | Ht 59.0 in | Wt 118.0 lb

## 2021-03-29 DIAGNOSIS — T8149XA Infection following a procedure, other surgical site, initial encounter: Secondary | ICD-10-CM

## 2021-03-29 NOTE — Progress Notes (Signed)
[facility] Note   HPI:  61 y.o. Female presents to clinic for post-op evaluation of right inguinal wound after recent right inguinal hernia site infection. Patient reports that she has been doing well since discharge.  She has been tolerating her diet without nausea and vomiting, and she is moving her bowels without issue.  She has been undergoing every other day right inguinal wound dressing changes.  Her pain is significantly better at this site.  She denies fever, chills, chest pain, shortness of breath.  She has no other complaints at this time.  Review of Systems:  All other review of systems: otherwise negative   Vital Signs:  BP 108/74    Pulse 89    Temp 98.4 F (36.9 C) (Oral)    Resp 12    Ht 4\' 11"  (1.499 m)    Wt 118 lb (53.5 kg)    LMP 03/11/2008 (Approximate)    SpO2 98%    BMI 23.83 kg/m    Physical Exam:  Physical Exam Vitals reviewed.  Constitutional:      Appearance: Normal appearance.  HENT:     Head: Normocephalic and atraumatic.  Eyes:     Extraocular Movements: Extraocular movements intact.     Pupils: Pupils are equal, round, and reactive to light.  Cardiovascular:     Rate and Rhythm: Normal rate and regular rhythm.  Pulmonary:     Effort: Pulmonary effort is normal.     Breath sounds: Normal breath sounds.  Abdominal:     General: Abdomen is flat. There is no distension.     Palpations: Abdomen is soft.     Tenderness: There is no abdominal tenderness. There is no guarding or rebound.     Comments: Previous lap chole incision sites healing well  Musculoskeletal:        General: Normal range of motion.     Cervical back: Normal range of motion.  Skin:    General: Skin is warm and dry.     Comments: Right inguinal incision site with packing in place, packing removed and wound base evaluated.  Good granulation tissue noted without purulent drainage.  Neurological:     General: No focal deficit present.     Mental Status: She is alert  and oriented to person, place, and time.  Psychiatric:        Mood and Affect: Mood normal.        Behavior: Behavior normal.    Laboratory studies: None  Imaging:  None  Assessment:  60 y.o. yo Female who is status post right inguinal hernia repair on 12/23 with bowel resection, and subsequent right inguinal hernia incision site infection.  Plan:  -Right inguinal wound is healing very well, with good granulation tissue at the base and no purulent drainage noted -Continue with every other day dressing changes with the wound nurse -Follow up Dr. Arnoldo Morale in 2 weeks -This entire encounter was performed with the use of a translator  All of the above recommendations were discussed with the patient and patient's family, and all of patient's and family's questions were answered to their expressed satisfaction.  Graciella Freer, DO [facility] [address] [place], [place] [address] [phone] (office)

## 2021-03-30 ENCOUNTER — Other Ambulatory Visit: Payer: Self-pay

## 2021-03-30 ENCOUNTER — Ambulatory Visit (HOSPITAL_COMMUNITY): Payer: 59 | Admitting: Physical Therapy

## 2021-03-30 DIAGNOSIS — R1031 Right lower quadrant pain: Secondary | ICD-10-CM

## 2021-03-30 DIAGNOSIS — T8130XA Disruption of wound, unspecified, initial encounter: Secondary | ICD-10-CM | POA: Diagnosis not present

## 2021-03-30 NOTE — Therapy (Signed)
[place] [facility] [address] [place], [place], [address] Phone: [phone]   Fax:  [phone]  Wound Care Therapy  Patient Details  Name: Janice Wells MRN: [id number] Date of Birth: September 19, 1960 No data recorded  Encounter Date: 03/30/2021   PT End of Session - 03/30/21 1200     Visit Number 6    Number of Visits 18    Date for PT Re-Evaluation 04/27/21    Authorization Type pending medicaid    PT Start Time 1050    PT Stop Time 1115    PT Time Calculation (min) 25 min    Activity Tolerance Patient tolerated treatment well    Behavior During Therapy [facility] for tasks assessed/performed             Past Medical History:  Diagnosis Date   Acute cholecystitis 02/02/2021   GERD without esophagitis 11/27/2020   Obesity, unspecified 02/15/2015    Past Surgical History:  Procedure Laterality Date   BOWEL RESECTION N/A 03/02/2021   Procedure: PARTIAL SMALL BOWEL RESECTION;  Surgeon: Franky Macho, MD;  Location: [facility];  Service: General;  Laterality: N/A;   CHOLECYSTECTOMY N/A 02/03/2021   Procedure: LAPAROSCOPIC CHOLECYSTECTOMY;  Surgeon: Franky Macho, MD;  Location: [facility];  Service: General;  Laterality: N/A;   INGUINAL HERNIA REPAIR Right 03/02/2021   Procedure: HERNIA REPAIR INGUINAL ADULT;  Surgeon: Franky Macho, MD;  Location: [facility];  Service: General;  Laterality: Right;    There were no vitals filed for this visit.               Wound Therapy - 03/30/21 0001     Subjective Pt states she saw the MD who is pleased with her incision    Patient and Family Stated Goals wound to heal    Date of Onset 03/10/21    Prior Treatments hospitalized with IV antibiotics and dressing chnge    Pain Scale 0-10    Pain Score 0-No pain    Evaluation and Treatment Procedures Explained to Patient/Family Yes    Evaluation and Treatment Procedures agreed to    Incision Properties Date First Assessed: 03/02/21 Time  First Assessed: 1037 Location: Abdomen Location Orientation: Other (Comment)   Dressing Type Abdominal pads;Gauze (Comment);Hydrogel    Dressing Old drainage (marked)    Dressing Change Frequency Monday, Wednesday, Friday    Site / Wound Assessment Granulation tissue    Closure None    Drainage Amount Minimal    Drainage Description Serous;Serosanguineous    Treatment Cleansed    Wound Therapy - Clinical Statement Dressing only needes slight moistening to be able to be removed without sticking.  Tissue continues to be red ; healthy and granulated.  PT will continue dressing changes until wound is near healed continues to have 3.5 + cm depth    Factors Delaying/Impairing Wound Healing Infection - systemic/local    Wound Therapy - Frequency 3X / week    Wound Therapy - Current Recommendations PT    Wound Plan irrigation no debridement needed    Dressing  wound packed with 2" conform soaked with saline and hydrogel f/b abd pad and tape to secure.                       PT Short Term Goals - 03/23/21 1611       PT SHORT TERM GOAL #1   Title PT wound depth to be no greater than 2.5 cm  Time 3    Period Weeks    Status On-going    Target Date 04/06/21      PT SHORT TERM GOAL #2   Title Pt pain to be no greater than a 3/10 to allow pt to feel up to completing ADL's    Time 3    Period Weeks    Status Achieved               PT Long Term Goals - 03/23/21 1611       PT LONG TERM GOAL #1   Title Pt wound to have no depth and be no greater than 2 x .5 cm for pt to feel confident in self care techniques.    Time 6    Period Weeks    Status On-going    Target Date 04/27/21      PT LONG TERM GOAL #2   Title PT pain to be no greater than a 1/10    Time 6    Period Weeks    Status On-going                   Plan - 03/30/21 1200     Clinical Impression Statement see above    Personal Factors and Comorbidities Past/Current Experience     Examination-Activity Limitations Bathing;Dressing    Stability/Clinical Decision Making Stable/Uncomplicated    Rehab Potential Good    PT Frequency 3x / week    PT Duration 6 weeks    PT Treatment/Interventions Other (comment)   wound care   PT Next Visit Plan debride if necessary; wet to moist dressing changes    Consulted and Agree with Plan of Care Patient             Patient will benefit from skilled therapeutic intervention in order to improve the following deficits and impairments:  Decreased skin integrity, Pain  Visit Diagnosis: Abdominal wound dehiscence, initial encounter  Right lower quadrant abdominal pain     Problem List Patient Active Problem List   Diagnosis Date Noted   Dysphagia 03/19/2021   Early satiety 03/19/2021   Abdominal pain, epigastric 03/19/2021   Wound infection after surgery 03/10/2021   Incarcerated right inguinal hernia    Ileus following gastrointestinal surgery (HCC) 02/27/2021   Oropharyngeal dysphagia    Constipation 02/16/2021   Non-English speaking patient 02/16/2021   Right inguinal hernia    Generalized abdominal pain 02/07/2021   Hypokalemia 02/07/2021   Intractable nausea and vomiting 02/07/2021   SBO (small bowel obstruction) (HCC) 02/06/2021   GERD (gastroesophageal reflux disease) 11/27/2020   Obesity, unspecified 02/15/2015   [name], PT CLT [phone]  03/30/2021, 12:04 PM  [facility] [address] [place], [place], [address] Phone: [phone]   Fax:  [phone]  Name: Janice Wells MRN: [id number] Date of Birth: 06/07/1960

## 2021-04-04 ENCOUNTER — Ambulatory Visit (HOSPITAL_COMMUNITY): Payer: 59 | Admitting: Physical Therapy

## 2021-04-04 ENCOUNTER — Other Ambulatory Visit: Payer: Self-pay

## 2021-04-04 DIAGNOSIS — T8130XA Disruption of wound, unspecified, initial encounter: Secondary | ICD-10-CM

## 2021-04-04 DIAGNOSIS — R1031 Right lower quadrant pain: Secondary | ICD-10-CM | POA: Diagnosis not present

## 2021-04-04 NOTE — Therapy (Signed)
[facility] [address] [place], [place], [address] Phone: [phone]   Fax:  [phone]  Wound Care Therapy  Patient Details  Name: Janice Wells MRN: [id number] Date of Birth: [birth year] No data recorded  Encounter Date: 04/04/2021   PT End of Session - 04/04/21 1000     Visit Number 7    Number of Visits 18    Date for PT Re-Evaluation 04/27/21    Authorization Type pending medicaid    PT Start Time 0920    PT Stop Time 0950    PT Time Calculation (min) 30 min    Activity Tolerance Patient tolerated treatment well    Behavior During Therapy [facility] for tasks assessed/performed             Past Medical History:  Diagnosis Date   Acute cholecystitis 02/02/2021   GERD without esophagitis 11/27/2020   Obesity, unspecified 02/15/2015    Past Surgical History:  Procedure Laterality Date   BOWEL RESECTION N/A 03/02/2021   Procedure: PARTIAL SMALL BOWEL RESECTION;  Surgeon: Franky Macho, MD;  Location: [facility];  Service: General;  Laterality: N/A;   CHOLECYSTECTOMY N/A 02/03/2021   Procedure: LAPAROSCOPIC CHOLECYSTECTOMY;  Surgeon: Franky Macho, MD;  Location: [facility];  Service: General;  Laterality: N/A;   INGUINAL HERNIA REPAIR Right 03/02/2021   Procedure: HERNIA REPAIR INGUINAL ADULT;  Surgeon: Franky Macho, MD;  Location: [facility];  Service: General;  Laterality: Right;    There were no vitals filed for this visit.               Wound Therapy - 04/04/21 0001     Subjective PT comes with an interpreter who states that pt is doing well; only has pain when packing is being pulled out.    Patient and Family Stated Goals wound to heal    Date of Onset 03/10/21    Prior Treatments hospitalized with IV antibiotics and dressing chnge    Pain Scale 0-10    Pain Score 0-No pain    Evaluation and Treatment Procedures Explained to Patient/Family Yes    Evaluation and Treatment Procedures agreed to     Incision Properties Date First Assessed: 03/02/21 Time First Assessed: 1037 Location: Abdomen Location Orientation: Other (Comment)   Dressing Type Abdominal pads;Hydrogel;Gauze (Comment)   2" kling soaked with hydrogel/sterile water.   Dressing Old drainage (marked)    Dressing Change Frequency Monday, Wednesday, Friday    Site / Wound Assessment Granulation tissue    Incision Length (cm) --   depth has no decreased to 3.2   Margins Attached edges (approximated)    Closure None    Drainage Amount Minimal    Drainage Description Serosanguineous    Treatment Cleansed    Wound Therapy - Clinical Statement Gauze needing significant flushing to be removed due to being adherent to abdominal wall.  Wound continues to decrease in depth, good granulation throughout the wound.    Factors Delaying/Impairing Wound Healing Infection - systemic/local    Wound Therapy - Frequency 3X / week    Wound Therapy - Current Recommendations PT    Wound Plan irrigation no debridement needed    Dressing  wound packed with 2" conform soaked with saline and hydrogel f/b abd pad and tape to secure.                       PT Short Term Goals - 03/23/21 1611  PT SHORT TERM GOAL #1   Title PT wound depth to be no greater than 2.5 cm    Time 3    Period Weeks    Status On-going    Target Date 04/06/21      PT SHORT TERM GOAL #2   Title Pt pain to be no greater than a 3/10 to allow pt to feel up to completing ADL's    Time 3    Period Weeks    Status Achieved               PT Long Term Goals - 03/23/21 1611       PT LONG TERM GOAL #1   Title Pt wound to have no depth and be no greater than 2 x .5 cm for pt to feel confident in self care techniques.    Time 6    Period Weeks    Status On-going    Target Date 04/27/21      PT LONG TERM GOAL #2   Title PT pain to be no greater than a 1/10    Time 6    Period Weeks    Status On-going                   Plan - 04/04/21  1001     Clinical Impression Statement as above    Personal Factors and Comorbidities Past/Current Experience    Examination-Activity Limitations Bathing;Dressing    Stability/Clinical Decision Making Stable/Uncomplicated    Rehab Potential Good    PT Frequency 3x / week    PT Duration 6 weeks    PT Treatment/Interventions Other (comment)   wound care   PT Next Visit Plan debride if necessary; wet to moist dressing changes    Consulted and Agree with Plan of Care Patient             Patient will benefit from skilled therapeutic intervention in order to improve the following deficits and impairments:  Decreased skin integrity, Pain  Visit Diagnosis: Abdominal wound dehiscence, initial encounter     Problem List Patient Active Problem List   Diagnosis Date Noted   Dysphagia 03/19/2021   Early satiety 03/19/2021   Abdominal pain, epigastric 03/19/2021   Wound infection after surgery 03/10/2021   Incarcerated right inguinal hernia    Ileus following gastrointestinal surgery (HCC) 02/27/2021   Oropharyngeal dysphagia    Constipation 02/16/2021   Non-English speaking patient 02/16/2021   Right inguinal hernia    Generalized abdominal pain 02/07/2021   Hypokalemia 02/07/2021   Intractable nausea and vomiting 02/07/2021   SBO (small bowel obstruction) (HCC) 02/06/2021   GERD (gastroesophageal reflux disease) 11/27/2020   Obesity, unspecified 02/15/2015   [name], PT CLT [phone]  04/04/2021, 10:02 AM   [facility] [address] [place], [place], [address] Phone: [phone]   Fax:  [phone]  Name: Janice Wells MRN: [id number] Date of Birth: 14-Jan-1961

## 2021-04-06 ENCOUNTER — Other Ambulatory Visit: Payer: Self-pay

## 2021-04-06 ENCOUNTER — Ambulatory Visit (HOSPITAL_COMMUNITY): Payer: 59 | Admitting: Physical Therapy

## 2021-04-06 DIAGNOSIS — R1031 Right lower quadrant pain: Secondary | ICD-10-CM | POA: Diagnosis not present

## 2021-04-06 DIAGNOSIS — T8130XA Disruption of wound, unspecified, initial encounter: Secondary | ICD-10-CM | POA: Diagnosis not present

## 2021-04-06 NOTE — Therapy (Signed)
[facility] [address] [place], [place], [address] Phone: [phone]   Fax:  [phone]  Wound Care Therapy  Patient Details  Name: Janice Wells MRN: [id number] Date of Birth: 31-Jul-1960 No data recorded  Encounter Date: 04/06/2021   PT End of Session - 04/06/21 1347     Visit Number 8    Number of Visits 18    Date for PT Re-Evaluation 04/27/21    Authorization Type pending medicaid    PT Start Time 1140    PT Stop Time 1205    PT Time Calculation (min) 25 min    Activity Tolerance Patient tolerated treatment well    Behavior During Therapy [facility] for tasks assessed/performed             Past Medical History:  Diagnosis Date   Acute cholecystitis 02/02/2021   GERD without esophagitis 11/27/2020   Obesity, unspecified 02/15/2015    Past Surgical History:  Procedure Laterality Date   BOWEL RESECTION N/A 03/02/2021   Procedure: PARTIAL SMALL BOWEL RESECTION;  Surgeon: Franky Macho, MD;  Location: [facility];  Service: General;  Laterality: N/A;   CHOLECYSTECTOMY N/A 02/03/2021   Procedure: LAPAROSCOPIC CHOLECYSTECTOMY;  Surgeon: Franky Macho, MD;  Location: [facility];  Service: General;  Laterality: N/A;   INGUINAL HERNIA REPAIR Right 03/02/2021   Procedure: HERNIA REPAIR INGUINAL ADULT;  Surgeon: Franky Macho, MD;  Location: [facility];  Service: General;  Laterality: Right;    There were no vitals filed for this visit.               Wound Therapy - 04/06/21 0001     Subjective PT comes with an interpreter who states that pt is doing well; only has pain when packing is being pulled out.    Patient and Family Stated Goals wound to heal    Date of Onset 03/10/21    Prior Treatments hospitalized with IV antibiotics and dressing chnge    Pain Scale 0-10    Pain Score 0-No pain    Evaluation and Treatment Procedures Explained to Patient/Family Yes    Evaluation and Treatment Procedures agreed to     Incision Properties Date First Assessed: 03/02/21 Time First Assessed: 1037 Location: Abdomen Location Orientation: Other (Comment)   Dressing Type Abdominal binder;Hydrogel;Gauze (Comment)    Dressing Old drainage (marked);New drainage    Dressing Change Frequency Monday, Wednesday, Friday    Site / Wound Assessment Granulation tissue    Margins Unattached edges (unapproximated)    Closure None    Drainage Amount Minimal    Drainage Description Serosanguineous    Treatment Cleansed    Wound Therapy - Clinical Statement Gauze needing significant flushing to be removed due to being adherent to abdominal wall.  Wound continues to decrease in depth, good granulation throughout the wound.    Factors Delaying/Impairing Wound Healing Infection - systemic/local    Wound Therapy - Frequency 3X / week    Wound Therapy - Current Recommendations PT    Wound Plan irrigation no debridement needed    Dressing  wound packed with 2" conform soaked with saline and hydrogel f/b abd pad and tape to secure.                       PT Short Term Goals - 03/23/21 1611       PT SHORT TERM GOAL #1   Title PT wound depth to be no greater than 2.5 cm  Time 3    Period Weeks    Status On-going    Target Date 04/06/21      PT SHORT TERM GOAL #2   Title Pt pain to be no greater than a 3/10 to allow pt to feel up to completing ADL's    Time 3    Period Weeks    Status Achieved               PT Long Term Goals - 03/23/21 1611       PT LONG TERM GOAL #1   Title Pt wound to have no depth and be no greater than 2 x .5 cm for pt to feel confident in self care techniques.    Time 6    Period Weeks    Status On-going    Target Date 04/27/21      PT LONG TERM GOAL #2   Title PT pain to be no greater than a 1/10    Time 6    Period Weeks    Status On-going                   Plan - 04/06/21 1347     Clinical Impression Statement as above    Personal Factors and  Comorbidities Past/Current Experience    Examination-Activity Limitations Bathing;Dressing    Stability/Clinical Decision Making Stable/Uncomplicated    Rehab Potential Good    PT Frequency 3x / week    PT Duration 6 weeks    PT Treatment/Interventions Other (comment)   wound care   PT Next Visit Plan debride if necessary; wet to moist dressing changes    Consulted and Agree with Plan of Care Patient             Patient will benefit from skilled therapeutic intervention in order to improve the following deficits and impairments:  Decreased skin integrity, Pain  Visit Diagnosis: Abdominal wound dehiscence, initial encounter     Problem List Patient Active Problem List   Diagnosis Date Noted   Dysphagia 03/19/2021   Early satiety 03/19/2021   Abdominal pain, epigastric 03/19/2021   Wound infection after surgery 03/10/2021   Incarcerated right inguinal hernia    Ileus following gastrointestinal surgery (HCC) 02/27/2021   Oropharyngeal dysphagia    Constipation 02/16/2021   Non-English speaking patient 02/16/2021   Right inguinal hernia    Generalized abdominal pain 02/07/2021   Hypokalemia 02/07/2021   Intractable nausea and vomiting 02/07/2021   SBO (small bowel obstruction) (HCC) 02/06/2021   GERD (gastroesophageal reflux disease) 11/27/2020   Obesity, unspecified 02/15/2015  [name], PT CLT [phone]  04/06/2021, 1:49 PM  [facility] [address] [place], [place], [address] Phone: [phone]   Fax:  [phone]  Name: Sheletha Bow MRN: [id number] Date of Birth: [birth year]

## 2021-04-09 ENCOUNTER — Encounter (HOSPITAL_COMMUNITY): Payer: Self-pay | Admitting: Radiology

## 2021-04-11 ENCOUNTER — Encounter: Payer: Self-pay | Admitting: Gastroenterology

## 2021-04-11 ENCOUNTER — Ambulatory Visit (HOSPITAL_COMMUNITY): Payer: Self-pay | Attending: Physician Assistant | Admitting: Physical Therapy

## 2021-04-11 ENCOUNTER — Ambulatory Visit (INDEPENDENT_AMBULATORY_CARE_PROVIDER_SITE_OTHER): Payer: Self-pay | Admitting: Gastroenterology

## 2021-04-11 ENCOUNTER — Encounter (HOSPITAL_COMMUNITY): Payer: Self-pay | Admitting: Physical Therapy

## 2021-04-11 ENCOUNTER — Other Ambulatory Visit: Payer: Self-pay

## 2021-04-11 VITALS — BP 129/70 | HR 78 | Temp 97.0°F | Ht 59.0 in | Wt 129.0 lb

## 2021-04-11 DIAGNOSIS — R1031 Right lower quadrant pain: Secondary | ICD-10-CM | POA: Insufficient documentation

## 2021-04-11 DIAGNOSIS — T8130XA Disruption of wound, unspecified, initial encounter: Secondary | ICD-10-CM | POA: Insufficient documentation

## 2021-04-11 DIAGNOSIS — K219 Gastro-esophageal reflux disease without esophagitis: Secondary | ICD-10-CM

## 2021-04-11 DIAGNOSIS — R1310 Dysphagia, unspecified: Secondary | ICD-10-CM

## 2021-04-11 DIAGNOSIS — R6881 Early satiety: Secondary | ICD-10-CM

## 2021-04-11 DIAGNOSIS — D649 Anemia, unspecified: Secondary | ICD-10-CM

## 2021-04-11 NOTE — Therapy (Signed)
[facility] [address] [place], [place], [address] Phone: [phone]   Fax:  [phone]  Wound Care Therapy  Patient Details  Name: Janice Wells MRN: [id number] Date of Birth: 01-Jun-1960 No data recorded  Encounter Date: 04/11/2021   PT End of Session - 04/11/21 0919     Visit Number 9    Number of Visits 18    Date for PT Re-Evaluation 04/27/21    Authorization Type pending medicaid    PT Start Time 0921    PT Stop Time 0947    PT Time Calculation (min) 26 min    Activity Tolerance Patient tolerated treatment well    Behavior During Therapy [facility] for tasks assessed/performed             Past Medical History:  Diagnosis Date   Acute cholecystitis 02/02/2021   GERD without esophagitis 11/27/2020   Obesity, unspecified 02/15/2015    Past Surgical History:  Procedure Laterality Date   BOWEL RESECTION N/A 03/02/2021   Procedure: PARTIAL SMALL BOWEL RESECTION;  Surgeon: Franky Macho, MD;  Location: [facility];  Service: General;  Laterality: N/A;   CHOLECYSTECTOMY N/A 02/03/2021   Procedure: LAPAROSCOPIC CHOLECYSTECTOMY;  Surgeon: Franky Macho, MD;  Location: [facility];  Service: General;  Laterality: N/A;   INGUINAL HERNIA REPAIR Right 03/02/2021   Procedure: HERNIA REPAIR INGUINAL ADULT;  Surgeon: Franky Macho, MD;  Location: [facility];  Service: General;  Laterality: Right;    There were no vitals filed for this visit.               Wound Therapy - 04/11/21 0001     Subjective painful with pulling dressing out, good otherwise    Patient and Family Stated Goals wound to heal    Date of Onset 03/10/21    Prior Treatments hospitalized with IV antibiotics and dressing chnge    Pain Score 0-No pain    Evaluation and Treatment Procedures Explained to Patient/Family Yes    Evaluation and Treatment Procedures agreed to    Incision Properties Date First Assessed: 03/02/21 Time First Assessed: 1037 Location:  Abdomen Location Orientation: Other (Comment)   Dressing Type Abdominal pads;Hydrogel;Gauze (Comment)    Dressing New drainage;Old drainage (marked)    Dressing Change Frequency Monday, Wednesday, Friday    Site / Wound Assessment Granulation tissue    Margins Unattached edges (unapproximated)    Closure None    Drainage Amount Minimal    Drainage Description Serosanguineous    Treatment Cleansed    Wound Therapy - Clinical Statement Gauze needing to be flushed frequently in order to remove from wound. Wound appearing healthy will granulation tissue. Continued to pack with conform coated in hydrogel and saline. Bandaged with abd and tape. Patient without c/o pain at end of session.    Factors Delaying/Impairing Wound Healing Infection - systemic/local    Wound Therapy - Frequency 3X / week    Wound Therapy - Current Recommendations PT    Wound Plan irrigation no debridement needed    Dressing  wound packed with 2" conform soaked with saline and hydrogel f/b abd pad and tape to secure.                       PT Short Term Goals - 03/23/21 1611       PT SHORT TERM GOAL #1   Title PT wound depth to be no greater than 2.5 cm    Time 3  Period Weeks    Status On-going    Target Date 04/06/21      PT SHORT TERM GOAL #2   Title Pt pain to be no greater than a 3/10 to allow pt to feel up to completing ADL's    Time 3    Period Weeks    Status Achieved               PT Long Term Goals - 03/23/21 1611       PT LONG TERM GOAL #1   Title Pt wound to have no depth and be no greater than 2 x .5 cm for pt to feel confident in self care techniques.    Time 6    Period Weeks    Status On-going    Target Date 04/27/21      PT LONG TERM GOAL #2   Title PT pain to be no greater than a 1/10    Time 6    Period Weeks    Status On-going                   Plan - 04/11/21 0920     Clinical Impression Statement see above    Personal Factors and  Comorbidities Past/Current Experience    Examination-Activity Limitations Bathing;Dressing    Stability/Clinical Decision Making Stable/Uncomplicated    Rehab Potential Good    PT Frequency 3x / week    PT Duration 6 weeks    PT Treatment/Interventions Other (comment)   wound care   PT Next Visit Plan debride if necessary; wet to moist dressing changes    Consulted and Agree with Plan of Care Patient             Patient will benefit from skilled therapeutic intervention in order to improve the following deficits and impairments:  Decreased skin integrity, Pain  Visit Diagnosis: Abdominal wound dehiscence, initial encounter  Right lower quadrant abdominal pain     Problem List Patient Active Problem List   Diagnosis Date Noted   Dysphagia 03/19/2021   Early satiety 03/19/2021   Abdominal pain, epigastric 03/19/2021   Wound infection after surgery 03/10/2021   Incarcerated right inguinal hernia    Ileus following gastrointestinal surgery (HCC) 02/27/2021   Oropharyngeal dysphagia    Constipation 02/16/2021   Non-English speaking patient 02/16/2021   Right inguinal hernia    Generalized abdominal pain 02/07/2021   Hypokalemia 02/07/2021   Intractable nausea and vomiting 02/07/2021   SBO (small bowel obstruction) (HCC) 02/06/2021   GERD (gastroesophageal reflux disease) 11/27/2020   Obesity, unspecified 02/15/2015    9:53 AM, 04/11/21 Wyman Songster PT, DPT Physical Therapist at [facility]   [facility] [facility] [address] [place], [place], [address] Phone: [phone]   Fax:  [phone]  Name: Janice Wells MRN: [id number] Date of Birth: 12-08-1960

## 2021-04-11 NOTE — Patient Instructions (Signed)
Please go to [facility] and have labs done. Take orders provided today. Continue omeprazole one capsule daily before breakfast until your next appointment here. Return to the office in 3 months.     1. [facility] [place]. Tome los pedidos proporcionados hoy. 2. Contine con una cpsula de omeprazol al da antes del desayuno hasta su prxima cita aqu. 3. Regreso a la oficina en 3 meses.

## 2021-04-11 NOTE — Progress Notes (Signed)
Primary Care Physician: Soyla Dryer, PA-C  Primary Gastroenterologist:  Elon Alas. Abbey Chatters, DO   Chief Complaint  Patient presents with   Gastroesophageal Reflux    Reports no issues.     HPI: Janice Wells is a 61 y.o. female here for follow up of GERD, dysphagia, need for screening colonoscopy.  She was last seen in the office about a month ago.  She presents with formal interpreter today. Complicated medical/surgical history as outlined in my March 19, 2021 note.  When I last saw her she was complaining of epigastric burning, fullness, nausea, early satiety.  She had a total of 27 pound weight loss since onset of illness back in November.  Complained of heartburn, dysphagia to solid foods.  She was still on antibiotics for wound infection.  She stopped pantoprazole stating it made her feel worse.    She was offered an upper endoscopy after last visit to evaluate her upper GI symptoms and dysphagia.  She declined wanting to try medication first.  She was started on omeprazole 40 mg daily before breakfast.  She presents today stating that she is doing much better.  She has no heartburn.  Her appetite has returned.  Her dysphagia resolved.  She is doing well on omeprazole 40 mg daily.  Bowel movements are regular.  No blood in the stool or melena.  No longer on antibiotics.  Current Outpatient Medications  Medication Sig Dispense Refill   omeprazole (PRILOSEC) 40 MG capsule Take 40 mg by mouth daily before breakfast.     No current facility-administered medications for this visit.    Allergies as of 04/11/2021   (No Known Allergies)    ROS:  General: Negative for anorexia, weight loss, fever, chills, fatigue, weakness. ENT: Negative for hoarseness, difficulty swallowing , nasal congestion. CV: Negative for chest pain, angina, palpitations, dyspnea on exertion, peripheral edema.  Respiratory: Negative for dyspnea at rest, dyspnea on exertion, cough, sputum,  wheezing.  GI: See history of present illness. GU:  Negative for dysuria, hematuria, urinary incontinence, urinary frequency, nocturnal urination.  Endo: Negative for unusual weight change.    Physical Examination:   BP 129/70    Pulse 78    Temp (!) 97 F (36.1 C)    Ht 4\' 11"  (1.499 m)    Wt 129 lb (58.5 kg)    LMP 03/11/2008 (Approximate)    BMI 26.05 kg/m   General: Well-nourished, well-developed in no acute distress.  Eyes: No icterus. Mouth: masked. Abdomen: Bowel sounds are normal, nontender, nondistended, no hepatosplenomegaly or masses, no abdominal bruits or hernia , no rebound or guarding.   Extremities: No lower extremity edema. No clubbing or deformities. Neuro: Alert and oriented x 4   Skin: Warm and dry, no jaundice.   Psych: Alert and cooperative, normal mood and affect.  Labs:  Lab Results  Component Value Date   CREATININE 0.36 (L) 03/14/2021   BUN <5 (L) 03/13/2021   NA 136 03/13/2021   K 4.3 03/13/2021   CL 106 03/13/2021   CO2 24 03/13/2021   Lab Results  Component Value Date   ALT 36 03/10/2021   AST 34 03/10/2021   ALKPHOS 109 03/10/2021   BILITOT 0.4 03/10/2021   Lab Results  Component Value Date   WBC 5.2 03/13/2021   HGB 8.6 (L) 03/13/2021   HCT 26.7 (L) 03/13/2021   MCV 90.2 03/13/2021   PLT 348 03/13/2021   Lab Results  Component Value Date  LIPASE 57 (H) 02/28/2021    Imaging Studies: CT ABDOMEN PELVIS W CONTRAST  Result Date: 03/13/2021 CLINICAL DATA:  Wound infection; previous cholecystectomy, hernia repair, bowel resection EXAM: CT ABDOMEN AND PELVIS WITH CONTRAST TECHNIQUE: Multidetector CT imaging of the abdomen and pelvis was performed using the standard protocol following bolus administration of intravenous contrast. CONTRAST:  80mL OMNIPAQUE IOHEXOL 300 MG/ML  SOLN COMPARISON:  03/10/2021 and previous FINDINGS: Lower chest: Trace left pleural effusion persists. Dependent atelectasis posteriorly at the left lung base. Persistent  linear scarring or atelectasis in both lower lobes. Hepatobiliary: No focal liver abnormality is seen. Status post cholecystectomy. No biliary dilatation. Pancreas: Unremarkable. No pancreatic ductal dilatation or surrounding inflammatory changes. Spleen: Normal in size without focal abnormality. Adrenals/Urinary Tract: Adrenal glands are unremarkable. Kidneys are normal, without renal calculi, focal lesion, or hydronephrosis. Bladder is unremarkable. Stomach/Bowel: Stomach is incompletely distended, unremarkable. Small bowel decompressed. Anastomotic staple line in the right lower quadrant. Incomplete distal passage of oral contrast material. Terminal ileum unremarkable. Normal appendix. The colon is nondilated, unremarkable. Vascular/Lymphatic: Scattered aortoiliac calcified atheromatous plaque without aneurysm or evident stenosis. Portal vein patent. No abdominal or pelvic adenopathy localized. Reproductive: Status post hysterectomy. No adnexal masses. Stable 7 mm coarse left ovarian calcification. Other: No free air.  No ascites. Musculoskeletal: Interval removal of skin staples overlying the right inguinal region with resolution of the previously noted deep subcutaneous fluid collection. Interval decrease in size of residual loculated fluid overlying the right inguinal ligament, 2.4 x 1.1 cm maximum transverse dimensions (previously 6 x 3.1). No new or enlarging collections. Degenerative disc disease L5-S1. No fracture or worrisome bone lesion. IMPRESSION: 1. Interval resolution of right lower pelvic subcutaneous fluid collection, and residual 2.4 cm collection overlying the right inguinal ligament (previously 6 cm). 2. No evidence of  obstruction. Electronically Signed   By: Lucrezia Europe M.D.   On: 03/13/2021 13:54     Assessment:  Upper GI symptoms: Doing much better, on omeprazole 40 mg daily.  Likely a combination of treating her reflux plus allowing more time to recover from multiple hospitalizations,  illnesses, surgery.  Her appetite is improved.  Her dysphagia resolved.  Her weight is up at least 7 to 8 pounds since she was last seen.  Anemia: Noted postoperatively.  Encouraged patient to update labs at this time.  Screening colonoscopy: We will postpone a little further given slow recovery process.  We will have her come back in 3 months and consider at that time.   Plan: Recommend updating CBC for history of anemia.   She will continue omeprazole 40 mg daily before breakfast until her next office visit here. Return to the office in 3 months.  At that time we will reevaluate symptoms.  At minimum, offer colonoscopy for screening purposes.

## 2021-04-12 ENCOUNTER — Ambulatory Visit (INDEPENDENT_AMBULATORY_CARE_PROVIDER_SITE_OTHER): Payer: Self-pay | Admitting: General Surgery

## 2021-04-12 ENCOUNTER — Encounter: Payer: Self-pay | Admitting: General Surgery

## 2021-04-12 VITALS — BP 134/75 | HR 80 | Temp 98.0°F | Resp 16 | Ht 59.0 in | Wt 125.0 lb

## 2021-04-12 DIAGNOSIS — T8130XA Disruption of wound, unspecified, initial encounter: Secondary | ICD-10-CM | POA: Diagnosis present

## 2021-04-12 DIAGNOSIS — R1031 Right lower quadrant pain: Secondary | ICD-10-CM | POA: Diagnosis present

## 2021-04-12 DIAGNOSIS — Z09 Encounter for follow-up examination after completed treatment for conditions other than malignant neoplasm: Secondary | ICD-10-CM

## 2021-04-12 NOTE — Progress Notes (Signed)
Subjective:     [name]  Patient here for follow-up wound check.  Has been receiving wet-to-dry dressings through the PT department at [facility].  Patient has no complaints. Objective:    BP 134/75    Pulse 80    Temp 98 F (36.7 C) (Other (Comment))    Resp 16    Ht 4\' 11"  (1.499 m)    Wt 125 lb (56.7 kg)    LMP 03/11/2008 (Approximate)    SpO2 98%    BMI 25.25 kg/m   General:  alert, cooperative, and no distress  Abdomen is soft.  Wound is healing well by secondary intention.  Q-tip and peroxide was used to clean the wound.     Assessment:    Doing well postoperatively. Wound healing well by secondary intention.  No purulent drainage present.    Plan:   Continue current wound care.  Follow-up here in 2 weeks.

## 2021-04-13 ENCOUNTER — Encounter (HOSPITAL_COMMUNITY): Payer: Self-pay | Admitting: Physical Therapy

## 2021-04-13 ENCOUNTER — Ambulatory Visit (HOSPITAL_COMMUNITY): Payer: Self-pay | Admitting: Physical Therapy

## 2021-04-13 ENCOUNTER — Other Ambulatory Visit: Payer: Self-pay

## 2021-04-13 DIAGNOSIS — T8130XA Disruption of wound, unspecified, initial encounter: Secondary | ICD-10-CM

## 2021-04-13 NOTE — Therapy (Signed)
[facility] [address] [place], [place], [address] Phone: [phone]   Fax:  [phone]  Wound Care Therapy  Patient Details  Name: Janice Wells MRN: [id number] Date of Birth: 12-13-1960 No data recorded  Encounter Date: 04/13/2021 Progress Note Reporting Period 03/16/2021 to 04/13/2021  See note below for Objective Data and Assessment of Progress/Goals.      PT End of Session - 04/13/21 0907     Visit Number 10    Number of Visits 18    Date for PT Re-Evaluation 04/27/21    Authorization Type pending medicaid    PT Start Time 0830    PT Stop Time 0854    PT Time Calculation (min) 24 min    Activity Tolerance Patient tolerated treatment well    Behavior During Therapy [facility] for tasks assessed/performed             Past Medical History:  Diagnosis Date   Acute cholecystitis 02/02/2021   GERD without esophagitis 11/27/2020   Obesity, unspecified 02/15/2015    Past Surgical History:  Procedure Laterality Date   BOWEL RESECTION N/A 03/02/2021   Procedure: PARTIAL SMALL BOWEL RESECTION;  Surgeon: Franky Macho, MD;  Location: [facility];  Service: General;  Laterality: N/A;   CHOLECYSTECTOMY N/A 02/03/2021   Procedure: LAPAROSCOPIC CHOLECYSTECTOMY;  Surgeon: Franky Macho, MD;  Location: [facility];  Service: General;  Laterality: N/A;   INGUINAL HERNIA REPAIR Right 03/02/2021   Procedure: HERNIA REPAIR INGUINAL ADULT;  Surgeon: Franky Macho, MD;  Location: [facility];  Service: General;  Laterality: Right;    There were no vitals filed for this visit.               Wound Therapy - 04/13/21 0001     Subjective PT states that she went to the MD yesterday who is pleased with the progress.  She is to return in two weeks.    Patient and Family Stated Goals wound to heal    Date of Onset 03/10/21    Prior Treatments hospitalized with IV antibiotics and dressing chnge    Pain Scale 0-10    Pain Score 0-No pain     Evaluation and Treatment Procedures Explained to Patient/Family Yes    Evaluation and Treatment Procedures agreed to    Incision Properties Date First Assessed: 03/02/21 Time First Assessed: 1037 Location: Abdomen Location Orientation: Other (Comment)   Dressing Type Abdominal pads   MD did not pack wound   Dressing New drainage;Old drainage (marked)    Dressing Change Frequency Monday, Wednesday, Friday    Site / Wound Assessment Granulation tissue;Pink   tissue is not as red as when wound is packed.;   Incision Length (cm) 5.3 cm   depth has decreased to 2.6; initially wound was 9.5 cm long with a depth of 4.5   Margins Unattached edges (unapproximated)    Closure None    Drainage Amount Minimal    Drainage Description Serosanguineous    Treatment Cleansed    Wound Therapy - Clinical Statement Therapist noted significant filling in of wound; Pt continues to need wet to moist packing as wound did not look as healthy with MD just putting ab pad over opening and taping.  Will continue to see pt until wound has filled in to prevent infecion.    Factors Delaying/Impairing Wound Healing Infection - systemic/local    Wound Therapy - Frequency 3X / week    Wound Therapy - Current Recommendations PT  Wound Plan irrigation no debridement needed    Dressing  wound packed with 2" conform soaked with saline and hydrogel f/b abd pad and tape to secure.                       PT Short Term Goals - 03/23/21 1611       PT SHORT TERM GOAL #1   Title PT wound depth to be no greater than 2.5 cm    Time 3    Period Weeks    Status On-going    Target Date 04/06/21      PT SHORT TERM GOAL #2   Title Pt pain to be no greater than a 3/10 to allow pt to feel up to completing ADL's    Time 3    Period Weeks    Status Achieved               PT Long Term Goals - 03/23/21 1611       PT LONG TERM GOAL #1   Title Pt wound to have no depth and be no greater than 2 x .5 cm for pt to  feel confident in self care techniques.    Time 6    Period Weeks    Status On-going    Target Date 04/27/21      PT LONG TERM GOAL #2   Title PT pain to be no greater than a 1/10    Time 6    Period Weeks    Status On-going                   Plan - 04/13/21 0911     Clinical Impression Statement see above    Personal Factors and Comorbidities Past/Current Experience    Examination-Activity Limitations Bathing;Dressing    Stability/Clinical Decision Making Stable/Uncomplicated    Rehab Potential Good    PT Frequency 3x / week    PT Duration 6 weeks    PT Treatment/Interventions Other (comment)   wound care   PT Next Visit Plan debride if necessary; wet to moist dressing changes    Consulted and Agree with Plan of Care Patient             Patient will benefit from skilled therapeutic intervention in order to improve the following deficits and impairments:  Decreased skin integrity, Pain  Visit Diagnosis: Abdominal wound dehiscence, initial encounter     Problem List Patient Active Problem List   Diagnosis Date Noted   Anemia 04/11/2021   Dysphagia 03/19/2021   Early satiety 03/19/2021   Abdominal pain, epigastric 03/19/2021   Wound infection after surgery 03/10/2021   Incarcerated right inguinal hernia    Ileus following gastrointestinal surgery (HCC) 02/27/2021   Oropharyngeal dysphagia    Constipation 02/16/2021   Non-English speaking patient 02/16/2021   Right inguinal hernia    Generalized abdominal pain 02/07/2021   Hypokalemia 02/07/2021   Intractable nausea and vomiting 02/07/2021   SBO (small bowel obstruction) (HCC) 02/06/2021   GERD (gastroesophageal reflux disease) 11/27/2020   Obesity, unspecified 02/15/2015   [name], PT CLT [phone]  04/13/2021, 9:15 AM  [facility] [address] [place], [place], [address] Phone: [phone]   Fax:  [phone]  Name: Janice Wells MRN: [id number] Date of Birth: [birth year]

## 2021-04-16 ENCOUNTER — Encounter (HOSPITAL_COMMUNITY): Payer: Self-pay | Admitting: Physical Therapy

## 2021-04-16 ENCOUNTER — Other Ambulatory Visit: Payer: Self-pay

## 2021-04-16 ENCOUNTER — Ambulatory Visit (HOSPITAL_COMMUNITY): Payer: Self-pay | Admitting: Physical Therapy

## 2021-04-16 DIAGNOSIS — T8130XA Disruption of wound, unspecified, initial encounter: Secondary | ICD-10-CM | POA: Diagnosis not present

## 2021-04-16 DIAGNOSIS — R1031 Right lower quadrant pain: Secondary | ICD-10-CM

## 2021-04-16 NOTE — Therapy (Signed)
[facility] [address] [place], [place], [address] Phone: [phone]   Fax:  [phone]  Wound Care Therapy  Patient Details  Name: Janice Wells MRN: [id number] Date of Birth: Dec 04, 1960 No data recorded  Encounter Date: 04/16/2021   PT End of Session - 04/16/21 1001     Visit Number 11    Number of Visits 18    Date for PT Re-Evaluation 04/27/21    Authorization Type pending medicaid    PT Start Time 1003    PT Stop Time 1028    PT Time Calculation (min) 25 min    Activity Tolerance Patient tolerated treatment well    Behavior During Therapy [facility] for tasks assessed/performed             Past Medical History:  Diagnosis Date   Acute cholecystitis 02/02/2021   GERD without esophagitis 11/27/2020   Obesity, unspecified 02/15/2015    Past Surgical History:  Procedure Laterality Date   BOWEL RESECTION N/A 03/02/2021   Procedure: PARTIAL SMALL BOWEL RESECTION;  Surgeon: Franky Macho, MD;  Location: [facility];  Service: General;  Laterality: N/A;   CHOLECYSTECTOMY N/A 02/03/2021   Procedure: LAPAROSCOPIC CHOLECYSTECTOMY;  Surgeon: Franky Macho, MD;  Location: [facility];  Service: General;  Laterality: N/A;   INGUINAL HERNIA REPAIR Right 03/02/2021   Procedure: HERNIA REPAIR INGUINAL ADULT;  Surgeon: Franky Macho, MD;  Location: [facility];  Service: General;  Laterality: Right;    There were no vitals filed for this visit.               Wound Therapy - 04/16/21 0001     Subjective Patient states she is doing well.    Patient and Family Stated Goals wound to heal    Date of Onset 03/10/21    Prior Treatments hospitalized with IV antibiotics and dressing chnge    Pain Score 0-No pain    Evaluation and Treatment Procedures Explained to Patient/Family Yes    Evaluation and Treatment Procedures agreed to    Incision Properties Date First Assessed: 03/02/21 Time First Assessed: 1037 Location: Abdomen Location  Orientation: Other (Comment)   Dressing Type Gauze (Comment);Hydrogel;Abdominal pads    Dressing Old drainage (marked);New drainage    Dressing Change Frequency Monday, Wednesday, Friday    Margins Unattached edges (unapproximated)    Closure None    Drainage Amount Minimal    Drainage Description Serosanguineous    Treatment Cleansed    Wound Therapy - Clinical Statement Wound appearing healthy with all granulation tissue. Required frequent application of saline to remove packing. Continued with hydrogel to conform and saline packed into wound. Applied abd with tape. Patient reports no pain or issue at end of session.    Factors Delaying/Impairing Wound Healing Infection - systemic/local    Wound Therapy - Frequency 3X / week    Wound Therapy - Current Recommendations PT    Wound Plan irrigation no debridement needed    Dressing  wound packed with 2" conform soaked with saline and hydrogel f/b abd pad and tape to secure.                       PT Short Term Goals - 03/23/21 1611       PT SHORT TERM GOAL #1   Title PT wound depth to be no greater than 2.5 cm    Time 3    Period Weeks    Status On-going  Target Date 04/06/21      PT SHORT TERM GOAL #2   Title Pt pain to be no greater than a 3/10 to allow pt to feel up to completing ADL's    Time 3    Period Weeks    Status Achieved               PT Long Term Goals - 03/23/21 1611       PT LONG TERM GOAL #1   Title Pt wound to have no depth and be no greater than 2 x .5 cm for pt to feel confident in self care techniques.    Time 6    Period Weeks    Status On-going    Target Date 04/27/21      PT LONG TERM GOAL #2   Title PT pain to be no greater than a 1/10    Time 6    Period Weeks    Status On-going                   Plan - 04/16/21 1001     Clinical Impression Statement see above    Personal Factors and Comorbidities Past/Current Experience    Examination-Activity Limitations  Bathing;Dressing    Stability/Clinical Decision Making Stable/Uncomplicated    Rehab Potential Good    PT Frequency 3x / week    PT Duration 6 weeks    PT Treatment/Interventions Other (comment)   wound care   PT Next Visit Plan debride if necessary; wet to moist dressing changes    Consulted and Agree with Plan of Care Patient             Patient will benefit from skilled therapeutic intervention in order to improve the following deficits and impairments:  Decreased skin integrity, Pain  Visit Diagnosis: Abdominal wound dehiscence, initial encounter  Right lower quadrant abdominal pain     Problem List Patient Active Problem List   Diagnosis Date Noted   Anemia 04/11/2021   Dysphagia 03/19/2021   Early satiety 03/19/2021   Abdominal pain, epigastric 03/19/2021   Wound infection after surgery 03/10/2021   Incarcerated right inguinal hernia    Ileus following gastrointestinal surgery (HCC) 02/27/2021   Oropharyngeal dysphagia    Constipation 02/16/2021   Non-English speaking patient 02/16/2021   Right inguinal hernia    Generalized abdominal pain 02/07/2021   Hypokalemia 02/07/2021   Intractable nausea and vomiting 02/07/2021   SBO (small bowel obstruction) (HCC) 02/06/2021   GERD (gastroesophageal reflux disease) 11/27/2020   Obesity, unspecified 02/15/2015    10:36 AM, 04/16/21 Wyman Songster PT, DPT Physical Therapist at [facility]   [place] [facility] [address] [place], [place], [address] Phone: [phone]   Fax:  [phone]  Name: Latrica Clowers MRN: [id number] Date of Birth: 1960-12-22

## 2021-04-18 ENCOUNTER — Ambulatory Visit (HOSPITAL_COMMUNITY): Payer: Self-pay | Admitting: Physical Therapy

## 2021-04-18 ENCOUNTER — Other Ambulatory Visit: Payer: Self-pay

## 2021-04-18 ENCOUNTER — Encounter (HOSPITAL_COMMUNITY): Payer: Self-pay | Admitting: Physical Therapy

## 2021-04-18 DIAGNOSIS — T8130XA Disruption of wound, unspecified, initial encounter: Secondary | ICD-10-CM | POA: Diagnosis not present

## 2021-04-18 DIAGNOSIS — R1031 Right lower quadrant pain: Secondary | ICD-10-CM

## 2021-04-18 NOTE — Therapy (Signed)
[place] [facility] [address] [place], [place], [address] Phone: [phone]   Fax:  [phone]  Wound Care Therapy  Patient Details  Name: Janice Wells MRN: [id number] Date of Birth: [birth year] No data recorded  Encounter Date: 04/18/2021   PT End of Session - 04/18/21 1134     Visit Number 12    Number of Visits 18    Date for PT Re-Evaluation 04/27/21    Authorization Type pending medicaid    PT Start Time 0905    PT Stop Time 0920    PT Time Calculation (min) 15 min    Activity Tolerance Patient tolerated treatment well    Behavior During Therapy [facility] for tasks assessed/performed             Past Medical History:  Diagnosis Date   Acute cholecystitis 02/02/2021   GERD without esophagitis 11/27/2020   Obesity, unspecified 02/15/2015    Past Surgical History:  Procedure Laterality Date   BOWEL RESECTION N/A 03/02/2021   Procedure: PARTIAL SMALL BOWEL RESECTION;  Surgeon: Franky Macho, MD;  Location: [facility];  Service: General;  Laterality: N/A;   CHOLECYSTECTOMY N/A 02/03/2021   Procedure: LAPAROSCOPIC CHOLECYSTECTOMY;  Surgeon: Franky Macho, MD;  Location: [facility];  Service: General;  Laterality: N/A;   INGUINAL HERNIA REPAIR Right 03/02/2021   Procedure: HERNIA REPAIR INGUINAL ADULT;  Surgeon: Franky Macho, MD;  Location: [facility];  Service: General;  Laterality: Right;    There were no vitals filed for this visit.               Wound Therapy - 04/18/21 0001     Subjective Patient states she is doing well.    Patient and Family Stated Goals wound to heal    Date of Onset 03/10/21    Prior Treatments hospitalized with IV antibiotics and dressing chnge    Pain Score 0-No pain    Evaluation and Treatment Procedures Explained to Patient/Family Yes    Evaluation and Treatment Procedures agreed to    Incision Properties Date First Assessed: 03/02/21 Time First Assessed: 1037 Location: Abdomen Location  Orientation: Other (Comment)   Dressing Type Gauze (Comment)    Dressing Old drainage (marked)    Dressing Change Frequency Monday, Wednesday, Friday    Site / Wound Assessment Granulation tissue    Incision Length (cm) 3.5 cm   depth 2.5   Margins Unattached edges (unapproximated)    Closure None    Drainage Amount Minimal    Drainage Description Serous    Treatment Cleansed    Wound Therapy - Clinical Statement Wound contnues to appear healthy with all granulation tissue. Dressing was more easily removed on this visit.    Factors Delaying/Impairing Wound Healing Infection - systemic/local    Wound Therapy - Frequency 3X / week    Wound Therapy - Current Recommendations PT    Wound Plan irrigation no debridement needed    Dressing  wound packed with 2" conform soaked with saline and hydrogel f/b abd pad and tape to secure.                       PT Short Term Goals - 04/18/21 1135       PT SHORT TERM GOAL #1   Title PT wound depth to be no greater than 2.5 cm    Time 3    Period Weeks    Status Achieved    Target Date 04/06/21  PT SHORT TERM GOAL #2   Title Pt pain to be no greater than a 3/10 to allow pt to feel up to completing ADL's    Time 3    Period Weeks    Status Achieved               PT Long Term Goals - 04/18/21 1135       PT LONG TERM GOAL #1   Title Pt wound to have no depth and be no greater than 2 x .5 cm for pt to feel confident in self care techniques.    Time 6    Period Weeks    Status On-going    Target Date 04/27/21      PT LONG TERM GOAL #2   Title PT pain to be no greater than a 1/10    Time 6    Period Weeks    Status Achieved                   Plan - 04/18/21 1134     Personal Factors and Comorbidities Past/Current Experience    Examination-Activity Limitations Bathing;Dressing    Stability/Clinical Decision Making Stable/Uncomplicated    Rehab Potential Good    PT Frequency 3x / week    PT Duration  6 weeks    PT Treatment/Interventions Other (comment)   wound care   PT Next Visit Plan debride if necessary; wet to moist dressing changes    Consulted and Agree with Plan of Care Patient             Patient will benefit from skilled therapeutic intervention in order to improve the following deficits and impairments:  Decreased skin integrity, Pain  Visit Diagnosis: Abdominal wound dehiscence, initial encounter  Right lower quadrant abdominal pain     Problem List Patient Active Problem List   Diagnosis Date Noted   Anemia 04/11/2021   Dysphagia 03/19/2021   Early satiety 03/19/2021   Abdominal pain, epigastric 03/19/2021   Wound infection after surgery 03/10/2021   Incarcerated right inguinal hernia    Ileus following gastrointestinal surgery (HCC) 02/27/2021   Oropharyngeal dysphagia    Constipation 02/16/2021   Non-English speaking patient 02/16/2021   Right inguinal hernia    Generalized abdominal pain 02/07/2021   Hypokalemia 02/07/2021   Intractable nausea and vomiting 02/07/2021   SBO (small bowel obstruction) (HCC) 02/06/2021   GERD (gastroesophageal reflux disease) 11/27/2020   Obesity, unspecified 02/15/2015   [name], PT CLT [phone]  04/18/2021, 11:36 AM  [place] [facility] [address] [place], [place], [address] Phone: [phone]   Fax:  [phone]  Name: Janice Wells MRN: [id number] Date of Birth: [birth year]

## 2021-04-20 ENCOUNTER — Other Ambulatory Visit: Payer: Self-pay

## 2021-04-20 ENCOUNTER — Ambulatory Visit (HOSPITAL_COMMUNITY): Payer: Self-pay | Admitting: Physical Therapy

## 2021-04-20 ENCOUNTER — Encounter (HOSPITAL_COMMUNITY): Payer: Self-pay | Admitting: Physical Therapy

## 2021-04-20 DIAGNOSIS — T8130XA Disruption of wound, unspecified, initial encounter: Secondary | ICD-10-CM | POA: Diagnosis not present

## 2021-04-20 NOTE — Therapy (Signed)
[place] [facility] [address] [place], [place], [address] Phone: [phone]   Fax:  [phone]  Wound Care Therapy  Patient Details  Name: Janice Wells MRN: [id number] Date of Birth: October 30, 1960 No data recorded  Encounter Date: 04/20/2021   PT End of Session - 04/20/21 1205     Visit Number 13    Number of Visits 18    Date for PT Re-Evaluation 04/27/21    Authorization Type pending medicaid    PT Start Time 1135    PT Stop Time 1145    PT Time Calculation (min) 10 min    Activity Tolerance Patient tolerated treatment well    Behavior During Therapy [facility] for tasks assessed/performed             Past Medical History:  Diagnosis Date   Acute cholecystitis 02/02/2021   GERD without esophagitis 11/27/2020   Obesity, unspecified 02/15/2015    Past Surgical History:  Procedure Laterality Date   BOWEL RESECTION N/A 03/02/2021   Procedure: PARTIAL SMALL BOWEL RESECTION;  Surgeon: Franky Macho, MD;  Location: [facility];  Service: General;  Laterality: N/A;   CHOLECYSTECTOMY N/A 02/03/2021   Procedure: LAPAROSCOPIC CHOLECYSTECTOMY;  Surgeon: Franky Macho, MD;  Location: [facility];  Service: General;  Laterality: N/A;   INGUINAL HERNIA REPAIR Right 03/02/2021   Procedure: HERNIA REPAIR INGUINAL ADULT;  Surgeon: Franky Macho, MD;  Location: [facility];  Service: General;  Laterality: Right;    There were no vitals filed for this visit.               Wound Therapy - 04/20/21 0001     Subjective Pt states she is eager to get back to work.    Patient and Family Stated Goals wound to heal    Date of Onset 03/10/21    Prior Treatments hospitalized with IV antibiotics and dressing chnge    Pain Score 0-No pain    Evaluation and Treatment Procedures Explained to Patient/Family Yes    Evaluation and Treatment Procedures agreed to    Incision Properties Date First Assessed: 03/02/21 Time First Assessed: 1037 Location:  Abdomen Location Orientation: Other (Comment)   Dressing Type Gauze (Comment)    Dressing Old drainage (marked)    Dressing Change Frequency Monday, Wednesday, Friday    Incision Length (cm) 3.2 cm    Margins Unattached edges (unapproximated)    Closure None    Drainage Amount Minimal    Drainage Description Serous    Treatment Cleansed    Wound Therapy - Clinical Statement Wound is well granulated.  Continues to close in. Therapist recommended that pt ask MD if she can go back to work.  Packing decreased to one 2x2 at this time.    Factors Delaying/Impairing Wound Healing Infection - systemic/local    Wound Therapy - Frequency 3X / week    Wound Therapy - Current Recommendations PT    Wound Plan wound to be measured next treratment irrigation no debridement needed    Dressing  wound packed with 2x2 gauze with hydrogel and water with 1/4 ab pad.                       PT Short Term Goals - 04/18/21 1135       PT SHORT TERM GOAL #1   Title PT wound depth to be no greater than 2.5 cm    Time 3    Period Weeks    Status  Achieved    Target Date 04/06/21      PT SHORT TERM GOAL #2   Title Pt pain to be no greater than a 3/10 to allow pt to feel up to completing ADL's    Time 3    Period Weeks    Status Achieved               PT Long Term Goals - 04/18/21 1135       PT LONG TERM GOAL #1   Title Pt wound to have no depth and be no greater than 2 x .5 cm for pt to feel confident in self care techniques.    Time 6    Period Weeks    Status On-going    Target Date 04/27/21      PT LONG TERM GOAL #2   Title PT pain to be no greater than a 1/10    Time 6    Period Weeks    Status Achieved                   Plan - 04/20/21 1205     Clinical Impression Statement see above    Personal Factors and Comorbidities Past/Current Experience    Examination-Activity Limitations Bathing;Dressing    Stability/Clinical Decision Making Stable/Uncomplicated     Rehab Potential Good    PT Frequency 3x / week    PT Duration 6 weeks    PT Treatment/Interventions Other (comment)   wound care   PT Next Visit Plan measure wound    Consulted and Agree with Plan of Care Patient             Patient will benefit from skilled therapeutic intervention in order to improve the following deficits and impairments:  Decreased skin integrity, Pain  Visit Diagnosis: Abdominal wound dehiscence, initial encounter     Problem List Patient Active Problem List   Diagnosis Date Noted   Anemia 04/11/2021   Dysphagia 03/19/2021   Early satiety 03/19/2021   Abdominal pain, epigastric 03/19/2021   Wound infection after surgery 03/10/2021   Incarcerated right inguinal hernia    Ileus following gastrointestinal surgery (HCC) 02/27/2021   Oropharyngeal dysphagia    Constipation 02/16/2021   Non-English speaking patient 02/16/2021   Right inguinal hernia    Generalized abdominal pain 02/07/2021   Hypokalemia 02/07/2021   Intractable nausea and vomiting 02/07/2021   SBO (small bowel obstruction) (HCC) 02/06/2021   GERD (gastroesophageal reflux disease) 11/27/2020   Obesity, unspecified 02/15/2015   [name], PT CLT [phone]  04/20/2021, 12:07 PM  [place] [facility] [address] [place], [place], [address] Phone: [phone]   Fax:  [phone]  Name: Kyrin Garn MRN: [id number] Date of Birth: May 18, 1960

## 2021-04-23 ENCOUNTER — Ambulatory Visit (HOSPITAL_COMMUNITY): Payer: Self-pay | Admitting: Physical Therapy

## 2021-04-23 ENCOUNTER — Other Ambulatory Visit: Payer: Self-pay

## 2021-04-23 ENCOUNTER — Encounter (HOSPITAL_COMMUNITY): Payer: Self-pay | Admitting: Physical Therapy

## 2021-04-23 ENCOUNTER — Other Ambulatory Visit (HOSPITAL_COMMUNITY)
Admission: RE | Admit: 2021-04-23 | Discharge: 2021-04-23 | Disposition: A | Discharge status: Home / Self Care | Payer: Self-pay | Source: Ambulatory Visit | Attending: Gastroenterology | Admitting: Gastroenterology

## 2021-04-23 DIAGNOSIS — D649 Anemia, unspecified: Secondary | ICD-10-CM | POA: Insufficient documentation

## 2021-04-23 DIAGNOSIS — T8130XA Disruption of wound, unspecified, initial encounter: Secondary | ICD-10-CM

## 2021-04-23 DIAGNOSIS — R1031 Right lower quadrant pain: Secondary | ICD-10-CM

## 2021-04-23 LAB — CBC WITH DIFFERENTIAL/PLATELET
Abs Immature Granulocytes: 0.01 10*3/uL (ref 0.00–0.07)
Basophils Absolute: 0 10*3/uL (ref 0.0–0.1)
Basophils Relative: 1 %
Eosinophils Absolute: 0.3 10*3/uL (ref 0.0–0.5)
Eosinophils Relative: 5 %
HCT: 35.5 % — ABNORMAL LOW (ref 36.0–46.0)
Hemoglobin: 11.5 g/dL — ABNORMAL LOW (ref 12.0–15.0)
Immature Granulocytes: 0 %
Lymphocytes Relative: 42 %
Lymphs Abs: 2.3 10*3/uL (ref 0.7–4.0)
MCH: 29.5 pg (ref 26.0–34.0)
MCHC: 32.4 g/dL (ref 30.0–36.0)
MCV: 91 fL (ref 80.0–100.0)
Monocytes Absolute: 0.4 10*3/uL (ref 0.1–1.0)
Monocytes Relative: 7 %
Neutro Abs: 2.4 10*3/uL (ref 1.7–7.7)
Neutrophils Relative %: 45 %
Platelets: 260 10*3/uL (ref 150–400)
RBC: 3.9 MIL/uL (ref 3.87–5.11)
RDW: 14.9 % (ref 11.5–15.5)
WBC: 5.4 10*3/uL (ref 4.0–10.5)
nRBC: 0 % (ref 0.0–0.2)

## 2021-04-23 NOTE — Therapy (Signed)
[facility] [address] [place], [place], [address] Phone: [phone]   Fax:  [phone]  Wound Care Therapy  Patient Details  Name: Janice Wells MRN: [id number] Date of Birth: June 02, 1960 No data recorded  Encounter Date: 04/23/2021   PT End of Session - 04/23/21 0916     Visit Number 14    Number of Visits 18    Date for PT Re-Evaluation 04/27/21    Authorization Type pending medicaid    PT Start Time 0919    PT Stop Time 0935    PT Time Calculation (min) 16 min    Activity Tolerance Patient tolerated treatment well    Behavior During Therapy [facility] for tasks assessed/performed             Past Medical History:  Diagnosis Date   Acute cholecystitis 02/02/2021   GERD without esophagitis 11/27/2020   Obesity, unspecified 02/15/2015    Past Surgical History:  Procedure Laterality Date   BOWEL RESECTION N/A 03/02/2021   Procedure: PARTIAL SMALL BOWEL RESECTION;  Surgeon: Franky Macho, MD;  Location: [facility];  Service: General;  Laterality: N/A;   CHOLECYSTECTOMY N/A 02/03/2021   Procedure: LAPAROSCOPIC CHOLECYSTECTOMY;  Surgeon: Franky Macho, MD;  Location: [facility];  Service: General;  Laterality: N/A;   INGUINAL HERNIA REPAIR Right 03/02/2021   Procedure: HERNIA REPAIR INGUINAL ADULT;  Surgeon: Franky Macho, MD;  Location: [facility];  Service: General;  Laterality: Right;    There were no vitals filed for this visit.               Wound Therapy - 04/23/21 0001     Subjective Patient states she is doing good    Patient and Family Stated Goals wound to heal    Date of Onset 03/10/21    Prior Treatments hospitalized with IV antibiotics and dressing chnge    Pain Score 0-No pain    Evaluation and Treatment Procedures Explained to Patient/Family Yes    Evaluation and Treatment Procedures agreed to    Incision Properties Date First Assessed: 03/02/21 Time First Assessed: 1037 Location: Abdomen Location  Orientation: Other (Comment)   Dressing Type Gauze (Comment)    Dressing Old drainage (marked)    Dressing Change Frequency Monday, Wednesday, Friday    Site / Wound Assessment Granulation tissue    Incision Length (cm) 2.4 cm   depth 1.7 cm   Margins Unattached edges (unapproximated)    Closure None    Drainage Amount Minimal    Treatment Cleansed    Wound Therapy - Clinical Statement Wound continues to decrease in size and depth compared to last week. Patient with less pain with unpacking as gauze less adherent. Continued with hydrogel on gauze but whole 2x2 too much to pack into wound, used 1ply of 2x2. Continued with abd and tape to secure.    Factors Delaying/Impairing Wound Healing Infection - systemic/local    Wound Therapy - Frequency 3X / week    Wound Therapy - Current Recommendations PT    Wound Plan wound to be measured next treratment irrigation no debridement needed    Dressing  wound packed with 2x2 gauze with hydrogel and water with 1/4 ab pad.                       PT Short Term Goals - 04/18/21 1135       PT SHORT TERM GOAL #1   Title PT wound depth to be  no greater than 2.5 cm    Time 3    Period Weeks    Status Achieved    Target Date 04/06/21      PT SHORT TERM GOAL #2   Title Pt pain to be no greater than a 3/10 to allow pt to feel up to completing ADL's    Time 3    Period Weeks    Status Achieved               PT Long Term Goals - 04/18/21 1135       PT LONG TERM GOAL #1   Title Pt wound to have no depth and be no greater than 2 x .5 cm for pt to feel confident in self care techniques.    Time 6    Period Weeks    Status On-going    Target Date 04/27/21      PT LONG TERM GOAL #2   Title PT pain to be no greater than a 1/10    Time 6    Period Weeks    Status Achieved                   Plan - 04/23/21 0916     Clinical Impression Statement see above    Personal Factors and Comorbidities Past/Current Experience     Examination-Activity Limitations Bathing;Dressing    Stability/Clinical Decision Making Stable/Uncomplicated    Rehab Potential Good    PT Frequency 3x / week    PT Duration 6 weeks    PT Treatment/Interventions Other (comment)   wound care   PT Next Visit Plan measure wound    Consulted and Agree with Plan of Care Patient             Patient will benefit from skilled therapeutic intervention in order to improve the following deficits and impairments:  Decreased skin integrity, Pain  Visit Diagnosis: Abdominal wound dehiscence, initial encounter  Right lower quadrant abdominal pain     Problem List Patient Active Problem List   Diagnosis Date Noted   Anemia 04/11/2021   Dysphagia 03/19/2021   Early satiety 03/19/2021   Abdominal pain, epigastric 03/19/2021   Wound infection after surgery 03/10/2021   Incarcerated right inguinal hernia    Ileus following gastrointestinal surgery (HCC) 02/27/2021   Oropharyngeal dysphagia    Constipation 02/16/2021   Non-English speaking patient 02/16/2021   Right inguinal hernia    Generalized abdominal pain 02/07/2021   Hypokalemia 02/07/2021   Intractable nausea and vomiting 02/07/2021   SBO (small bowel obstruction) (HCC) 02/06/2021   GERD (gastroesophageal reflux disease) 11/27/2020   Obesity, unspecified 02/15/2015    9:47 AM, 04/23/21 Wyman Songster PT, DPT Physical Therapist at [facility]   [place] [facility] [address] [place], [place], [address] Phone: [phone]   Fax:  [phone]  Name: Janice Wells MRN: [id number] Date of Birth: [birth year]

## 2021-04-25 ENCOUNTER — Encounter (HOSPITAL_COMMUNITY): Payer: Self-pay | Admitting: Physical Therapy

## 2021-04-25 ENCOUNTER — Ambulatory Visit (HOSPITAL_COMMUNITY): Payer: Self-pay | Admitting: Physical Therapy

## 2021-04-25 ENCOUNTER — Other Ambulatory Visit: Payer: Self-pay

## 2021-04-25 DIAGNOSIS — T8130XA Disruption of wound, unspecified, initial encounter: Secondary | ICD-10-CM | POA: Diagnosis not present

## 2021-04-25 NOTE — Therapy (Signed)
[place], [place], [address] Phone: [phone]   Fax:  [phone]  Wound Care Therapy  Patient Details  Name: Janice Wells MRN: [id number] Date of Birth: 08/14/1960 No data recorded  Encounter Date: 04/25/2021   PT End of Session - 04/25/21 1031     Visit Number 15    Number of Visits 18    Date for PT Re-Evaluation 04/27/21    Authorization Type pending medicaid    PT Start Time 1005    PT Stop Time 1018    PT Time Calculation (min) 13 min    Activity Tolerance Patient tolerated treatment well    Behavior During Therapy [facility] for tasks assessed/performed             Past Medical History:  Diagnosis Date   Acute cholecystitis 02/02/2021   GERD without esophagitis 11/27/2020   Obesity, unspecified 02/15/2015    Past Surgical History:  Procedure Laterality Date   BOWEL RESECTION N/A 03/02/2021   Procedure: PARTIAL SMALL BOWEL RESECTION;  Surgeon: Aviva Signs, MD;  Location: [facility];  Service: General;  Laterality: N/A;   CHOLECYSTECTOMY N/A 02/03/2021   Procedure: LAPAROSCOPIC CHOLECYSTECTOMY;  Surgeon: Aviva Signs, MD;  Location: [facility];  Service: General;  Laterality: N/A;   INGUINAL HERNIA REPAIR Right 03/02/2021   Procedure: HERNIA REPAIR INGUINAL ADULT;  Surgeon: Aviva Signs, MD;  Location: [facility];  Service: General;  Laterality: Right;    There were no vitals filed for this visit.               Wound Therapy - 04/25/21 0001     Subjective Pt states she is doing well and has no pain    Patient and Family Stated Goals wound to heal    Date of Onset 03/10/21    Prior Treatments hospitalized with IV antibiotics and dressing chnge    Pain Score 0-No pain    Evaluation and Treatment Procedures Explained to Patient/Family Yes    Evaluation and Treatment Procedures agreed to    Incision Properties Date First Assessed: 03/02/21 Time First Assessed: 2202 Location:  Abdomen Location Orientation: Other (Comment)   Dressing Type Gauze (Comment);Hydrogel    Dressing Old drainage (marked)    Dressing Change Frequency Monday, Wednesday, Friday    Site / Wound Assessment Granulation tissue    Incision Length (cm) 1.7 cm   .7; was 2.4 x 1.7   Margins Unattached edges (unapproximated)    Closure None    Drainage Amount Minimal    Drainage Description Serous    Treatment Cleansed    Wound Therapy - Clinical Statement Therapist noted an area that actually has some hypertrophic granulation at the edge of the incision therefore added foam over 2x2 to add slight pressure to wound.  Wound is healthy and closing fast.  Pt is seeing MD tomorrow.  Via interpretter therapist requested that pt ask the MD if he wants Pt to continue with skilled PT as pt should be able to take care of the wound on her own now.  Pt going to see MD tomorrow and will let the clinic know.  Therapist edcuated pt on how to take care of wound if pt does not come back to treatment including cleansing with soap and water in the shower, after patting dry apply an extra large bandaid on the area.    Factors Delaying/Impairing Wound Healing Infection - systemic/local    Wound Therapy - Frequency 3X /  week    Wound Therapy - Current Recommendations PT    Wound Plan wound to be measured next treratment irrigation no debridement needed    Dressing  2x2 cut in half impregnated with hydrogel, 2x2 , foam and medipore tape.                       PT Short Term Goals - 04/18/21 1135       PT SHORT TERM GOAL #1   Title PT wound depth to be no greater than 2.5 cm    Time 3    Period Weeks    Status Achieved    Target Date 04/06/21      PT SHORT TERM GOAL #2   Title Pt pain to be no greater than a 3/10 to allow pt to feel up to completing ADL's    Time 3    Period Weeks    Status Achieved               PT Long Term Goals - 04/25/21 1032       PT LONG TERM GOAL #1   Title Pt wound  to have no depth and be no greater than 2 x .5 cm for pt to feel confident in self care techniques.    Time 6    Period Weeks    Status Partially Met    Target Date 04/27/21      PT LONG TERM GOAL #2   Title PT pain to be no greater than a 1/10    Time 6    Period Weeks    Status Achieved                   Plan - 04/25/21 1031     Clinical Impression Statement see above    Personal Factors and Comorbidities Past/Current Experience    Examination-Activity Limitations Bathing;Dressing    Stability/Clinical Decision Making Stable/Uncomplicated    Rehab Potential Good    PT Frequency 3x / week    PT Duration 6 weeks    PT Treatment/Interventions Other (comment)   wound care   PT Next Visit Plan measure wound    Consulted and Agree with Plan of Care Patient             Patient will benefit from skilled therapeutic intervention in order to improve the following deficits and impairments:  Decreased skin integrity, Pain  Visit Diagnosis: Abdominal wound dehiscence, initial encounter     Problem List Patient Active Problem List   Diagnosis Date Noted   Anemia 04/11/2021   Dysphagia 03/19/2021   Early satiety 03/19/2021   Abdominal pain, epigastric 03/19/2021   Wound infection after surgery 03/10/2021   Incarcerated right inguinal hernia    Ileus following gastrointestinal surgery ([place]) 02/27/2021   Oropharyngeal dysphagia    Constipation 02/16/2021   Non-English speaking patient 02/16/2021   Right inguinal hernia    Generalized abdominal pain 02/07/2021   Hypokalemia 02/07/2021   Intractable nausea and vomiting 02/07/2021   SBO (small bowel obstruction) ([place]) 02/06/2021   GERD (gastroesophageal reflux disease) 11/27/2020   Obesity, unspecified 02/15/2015   Rayetta Humphrey, PT CLT [phone]  04/25/2021, 10:32 AM  [place], [place], [address] Phone: [phone]   Fax:   [phone]  Name: Janice Wells MRN: [id number] Date of Birth: April 11, 1960

## 2021-04-26 ENCOUNTER — Ambulatory Visit (INDEPENDENT_AMBULATORY_CARE_PROVIDER_SITE_OTHER): Payer: Self-pay | Admitting: General Surgery

## 2021-04-26 ENCOUNTER — Encounter: Payer: Self-pay | Admitting: General Surgery

## 2021-04-26 ENCOUNTER — Encounter (HOSPITAL_COMMUNITY): Payer: Self-pay | Admitting: Physical Therapy

## 2021-04-26 ENCOUNTER — Telehealth (HOSPITAL_COMMUNITY): Payer: Self-pay | Admitting: Physical Therapy

## 2021-04-26 VITALS — BP 139/84 | HR 70 | Temp 97.6°F | Resp 14 | Ht 59.0 in | Wt 127.0 lb

## 2021-04-26 DIAGNOSIS — Z09 Encounter for follow-up examination after completed treatment for conditions other than malignant neoplasm: Secondary | ICD-10-CM

## 2021-04-26 NOTE — Therapy (Signed)
[place], [place], [address] Phone: [phone]   Fax:  [phone]  Patient Details  Name: Sophina Mitten MRN: [id number] Date of Birth: 06/05/1960 Referring Provider:  No ref. provider found  Encounter Date: 04/26/2021 PHYSICAL THERAPY DISCHARGE SUMMARY  Visits from Start of Care: 15  Current functional level related to goals / functional outcomes: Wound has closed in to 1cm x .7 cm    Remaining deficits: Open wound   Education / Equipment: Educated on how to take care of at home   MD called and cancelled tx.  Patient agrees to discharge. Patient goals were met. Patient is being discharged due to meeting the stated rehab goals.  Rayetta Humphrey, PT CLT [phone]  04/26/2021, 12:05 PM  [place] [address] [place], [place], [address] Phone: [phone]   Fax:  [phone]

## 2021-04-26 NOTE — Progress Notes (Signed)
Subjective:     [name]  Here for wound check.  An interpreter was present.  She states she is doing well and has no complaints.  She has been receiving wound care through the physical therapy department at [facility]. Objective:    BP 139/84    Pulse 70    Temp 97.6 F (36.4 C) (Other (Comment))    Resp 14    Ht 4\' 11"  (1.499 m)    Wt 127 lb (57.6 kg)    LMP 03/11/2008 (Approximate)    SpO2 98%    BMI 25.65 kg/m   General:  alert, cooperative, and no distress  Abdomen is soft.  Incision well-healed with tiny area of granulation tissue present which was superficial in nature.  Silver nitrate applied.     Assessment:    Abdominal wound has healed well by secondary intention.    Plan:   May stop going to the wound clinic.  Keep wound clean and dry with soap and water.  Follow-up here as needed.

## 2021-04-26 NOTE — Telephone Encounter (Signed)
Dr. Lovell Sheehan called to confirm the patient's wound has healed and she is ready for d/c

## 2021-04-27 ENCOUNTER — Ambulatory Visit (HOSPITAL_COMMUNITY): Payer: 59 | Admitting: Physical Therapy

## 2021-04-30 ENCOUNTER — Ambulatory Visit (HOSPITAL_COMMUNITY): Payer: 59 | Admitting: Physical Therapy

## 2021-05-02 ENCOUNTER — Ambulatory Visit (HOSPITAL_COMMUNITY): Payer: Self-pay

## 2021-05-02 NOTE — Progress Notes (Signed)
Date: 05/02/2021  Dear Ms. Salinas-Martinez,  We have been unable to reach you by telephone regarding your recent lab results.  Please call us as soon as possible to discuss this matter.   Also, be sure to let us know how to contact you by phone in the future if necessary.   Sincerely,     Zada Finders, CMA

## 2021-05-04 ENCOUNTER — Ambulatory Visit (HOSPITAL_COMMUNITY): Payer: Self-pay | Admitting: Physical Therapy

## 2021-05-07 ENCOUNTER — Ambulatory Visit (HOSPITAL_COMMUNITY): Payer: 59 | Admitting: Physical Therapy

## 2021-07-23 ENCOUNTER — Ambulatory Visit: Payer: Self-pay | Admitting: Physician Assistant

## 2021-07-23 ENCOUNTER — Encounter: Payer: Self-pay | Admitting: Physician Assistant

## 2021-07-23 VITALS — BP 146/79 | HR 78 | Temp 97.7°F | Wt 140.0 lb

## 2021-07-23 DIAGNOSIS — Z789 Other specified health status: Secondary | ICD-10-CM

## 2021-07-23 DIAGNOSIS — R030 Elevated blood-pressure reading, without diagnosis of hypertension: Secondary | ICD-10-CM

## 2021-07-23 DIAGNOSIS — M25511 Pain in right shoulder: Secondary | ICD-10-CM

## 2021-07-23 NOTE — Progress Notes (Signed)
? ?BP (!) 146/79   Pulse 78   Temp 97.7 ?F (36.5 ?C)   Wt 140 lb (63.5 kg)   LMP 03/11/2008 (Approximate)   SpO2 99%   BMI 28.28 kg/m?   ? ?Subjective:  ? ? Patient ID: Janice Wells, female    DOB: 08-06-1960, 61 y.o.   MRN: [id number] ? ?HPI: ?Janice Wells is a 61 y.o. female presenting on 07/23/2021 for No chief complaint on file. ? ? ?HPI ? ?Pt is  60yF who prefsents for routine follow up.       ? ?She is not having any abdominal pain ? ?Since she was seen in office 01/22/21-  inpatient for stomach  issues 5 times.    ? ?GI put her on omeprazole when seen in 04/11/21 but she is no longer taking it.   She says she is no longer having problems.  ? ?She has appt to follow up with GI this Friday. ? ?She says she Sometimes gets right shoulder pain.  She had no injury.  ? ? ? ?Relevant past medical, surgical, family and social history reviewed and updated as indicated. Interim medical history since our last visit reviewed. ?Allergies and medications reviewed and updated. ? ? ?CURRENT MEDS: ?None ? ? ?Review of Systems ? ?Per HPI unless specifically indicated above ? ?   ?Objective:  ?  ?BP (!) 146/79   Pulse 78   Temp 97.7 ?F (36.5 ?C)   Wt 140 lb (63.5 kg)   LMP 03/11/2008 (Approximate)   SpO2 99%   BMI 28.28 kg/m?   ?Wt Readings from Last 3 Encounters:  ?07/23/21 140 lb (63.5 kg)  ?04/26/21 127 lb (57.6 kg)  ?04/12/21 125 lb (56.7 kg)  ?  ? ?Weight last appt in this office 01/22/21- 145 lb ? ?Physical Exam ?Vitals reviewed.  ?Constitutional:   ?   General: She is not in acute distress. ?   Appearance: She is well-developed. She is not ill-appearing.  ?HENT:  ?   Head: Normocephalic and atraumatic.  ?Cardiovascular:  ?   Rate and Rhythm: Normal rate and regular rhythm.  ?Pulmonary:  ?   Effort: Pulmonary effort is normal.  ?   Breath sounds: Normal breath sounds.  ?Abdominal:  ?   General: Bowel sounds are normal.  ?   Palpations: Abdomen is soft. There is no mass.  ?   Tenderness:  There is no abdominal tenderness.  ?Musculoskeletal:  ?   Right shoulder: Normal. No tenderness. Normal range of motion.  ?   Right upper arm: Normal.  ?   Cervical back: Neck supple.  ?   Right lower leg: No edema.  ?   Left lower leg: No edema.  ?Lymphadenopathy:  ?   Cervical: No cervical adenopathy.  ?Skin: ?   General: Skin is warm and dry.  ?Neurological:  ?   Mental Status: She is alert and oriented to person, place, and time.  ?Psychiatric:     ?   Behavior: Behavior normal.  ? ? ? ? ? ? ?   ?Assessment & Plan:  ? ?Encounter Diagnoses  ?Name Primary?  ? Elevated blood pressure reading without diagnosis of hypertension Yes  ? Not proficient in English language   ? Right shoulder pain, unspecified chronicity   ? ? ? ? ? ?HCM:  ?FIT test 8/22- due 10/2021 ?Pap 2020- due 2025 ?Mammogram- due- (she missed her appt in January ? ?2. Shoulder pain- ice, apap or ibu prn ? ?3. Elevated bp ?  Pt with normal to low-normal bp at previus appointments.  She was counseled on DASH diet and given handout.  Will recheck at appointment in 3 months. ? ? ?-pt to follow up  3 months.  She is to contact office sooner prn ? ?

## 2021-07-23 NOTE — Patient Instructions (Signed)
Plan de alimentaci?n DASH ?DASH Eating Plan ?DASH es la sigla en ingl?s de ?Enfoques Alimentarios para Detener la Hipertensi?n?. El plan de alimentaci?n DASH ha demostrado: ?Bajar la presi?n arterial elevada (hipertensi?n). ?Reducir el riesgo de diabetes tipo 2, enfermedad card?aca y accidente cerebrovascular. ?Ayudar a perder peso. ?Consejos para seguir este plan ?Leer las etiquetas de los alimentos ?Verifique la cantidad de sal (sodio) por porci?n en las etiquetas de los alimentos. Elija alimentos con menos del 5 por ciento del valor diario de sodio. Generalmente, los alimentos con menos de 300 miligramos (mg) de sodio por porci?n se encuadran dentro de este plan alimentario. ?Para encontrar cereales integrales, busque la palabra "integral" como primera palabra en la lista de ingredientes. ?Al ir de compras ?Compre productos en los que en su etiqueta diga: ?bajo contenido de sodio? o ?sin agregado de sal?. ?Compre alimentos frescos. Evite los alimentos enlatados y comidas precocidas o congeladas. ?Al cocinar ?Evite agregar sal cuando cocine. Use hierbas o aderezos sin sal, en lugar de sal de mesa o sal marina. Consulte al m?dico o farmac?utico antes de usar sustitutos de la sal. ?No fr?a los alimentos. A la hora de cocinar los alimentos opte por hornearlos, hervirlos, grillarlos, asarlos al horno y asarlos a la parrilla. ?Cocine con aceites cardiosaludables, como oliva, canola, aguacate, soja o girasol. ?Planificaci?n de las comidas ? ?Consuma una dieta equilibrada, que incluya lo siguiente: ?4 o m?s porciones de frutas y 4 o m?s porciones de verduras por d?a. Trate de que medio plato de cada comida sea de frutas y verduras. ?De 6 a 8 porciones de cereales integrales todos los d?as. ?Menos de 6 onzas (170 g) de carne, aves o pescado magros por d?a. Una porci?n de 3 onzas (85 g) de carne tiene casi el mismo tama?o que un mazo de cartas. Un huevo equivale a 1 onza (28 g). ?De 2 a 3 porciones de productos l?cteos  descremados por d?a. Una porci?n es 1 taza (237 ml). ?1 porci?n de frutos secos, semillas o frijoles 5 veces por semana. ?De 2 a 3 porciones de grasas cardiosaludables. Las grasas saludables llamadas ?cidos grasos omega-3 se encuentran en alimentos como las nueces, las semillas de lino, las leches fortificadas y los huevos. Estas grasas tambi?n se encuentran en los pescados de agua fr?a, como la sardina, el salm?n y la caballa. ?Limite la cantidad que consume de: ?Alimentos enlatados o envasados. ?Alimentos con alto contenido de grasa trans, como algunos alimentos fritos. ?Alimentos con alto contenido de grasa saturada, como carne con grasa. ?Postres y otros dulces, bebidas azucaradas y otros alimentos con az?car agregada. ?Productos l?cteos enteros. ?No le agregue sal a los alimentos antes de probarlos. ?No coma m?s de 4 yemas de huevo por semana. ?Trate de comer al menos 2 comidas vegetarianas por semana. ?Consuma m?s comida casera y menos de restaurante, de bares y comida r?pida. ?Estilo de vida ?Cuando coma en un restaurante, pida que preparen su comida con menos sal o, en lo posible, sin nada de sal. ?Si bebe alcohol: ?Limite la cantidad que bebe: ?De 0 a 1 medida por d?a para las mujeres que no est?n embarazadas. ?De 0 a 2 medidas por d?a para los hombres. ?Est? atento a la cantidad de alcohol que hay en las bebidas que toma. En los [place], una medida equivale a una botella de cerveza de 12 oz (355 ml), un vaso de vino de 5 oz (148 ml) o un vaso de una bebida alcoh?lica de alta graduaci?n de 1? oz (  44 ml). ?Informaci?n general ?Evite ingerir m?s de 2300 mg de sal por d?a. Si tiene hipertensi?n, es posible que necesite reducir la ingesta de sodio a 1,500 mg por d?a. ?Trabaje con su m?dico para mantener un peso saludable o perder peso. Preg?ntele cu?l es el peso recomendado para usted. ?Realice al menos 30 minutos de ejercicio que haga que se acelere su coraz?n (ejercicio aer?bico) la mayor?a de los d?as  de la semana. Estas actividades pueden incluir caminar, nadar o andar en bicicleta. ?Trabaje con su m?dico o nutricionista para ajustar su plan alimentario a sus necesidades cal?ricas personales. ??Qu? alimentos debo comer? ?Frutas ?Todas las frutas frescas, congeladas o disecadas. Frutas enlatadas en jugo natural (sin agregado de az?car). ?Verduras ?Verduras frescas o congeladas (crudas, al vapor, asadas o grilladas). Jugos de tomate y verduras con bajo contenido de sodio o reducidos en sodio. Salsa y pasta de tomate con bajo contenido de sodio o reducidas en sodio. Verduras enlatadas con bajo contenido de sodio o reducidas en sodio. ?Granos ?Pan de salvado o integral. Pasta de salvado o integral. Arroz integral. Avena. Quinua. Trigo burgol. Cereales integrales y con bajo contenido de sodio. Pan pita. Galletitas de agua con bajo contenido de grasa y sodio. Tortillas de harina integral. ?Carnes y otras prote?nas ?Pollo o pavo sin piel. Carne de pollo o de pavo molida. Cerdo desgrasado. Pescado y mariscos. Claras de huevo. Porotos, guisantes o lentejas secos. Frutos secos, mantequilla de frutos secos y semillas sin sal. Frijoles enlatados sin sal. Cortes de carne vacuna magra, desgrasada. Carne precocida o curada magra y baja en sodio, como embutidos o panes de carne. ?L?cteos ?Leche descremada (1 %) o descremada. Quesos reducidos en grasa, con bajo contenido de grasa o descremados. Queso blanco o ricota sin grasa, con bajo contenido de sodio. Yogur semidescremado o descremado. Queso con bajo contenido de grasa y sodio. ?Grasas y aceites ?Margarinas untables que no contengan grasas trans. Aceite vegetal. [place]onesa y aderezos para ensaladas livianos, reducidos en grasa o con bajo contenido de grasas (reducidos en sodio). Aceite de canola, c?rtamo, oliva, aguacate, soja y girasol. Aguacate. ?Ali?os y condimentos ?Hierbas. Especias. Mezclas de condimentos sin sal. ?Otros alimentos ?Palomitas de ma?z y pretzels sin sal.  Dulces con bajo contenido de grasas. ?Es posible que los productos que se enumeran m?s arriba no constituyan una lista completa de los alimentos y las bebidas que puede tomar. Consulte a un nutricionista para obtener m?s informaci?n. ??Qu? alimentos debo evitar? ?Frutas ?Fruta enlatada en alm?bar liviano o espeso. Frutas cocidas en aceite. Frutas con salsa de crema o mantequilla. ?Verduras ?Verduras con crema o fritas. Verduras en salsa de queso. Verduras enlatadas regulares (que no sean con bajo contenido de sodio o reducidas en sodio). Pasta y salsa de tomates enlatadas regulares (que no sean con bajo contenido de sodio o reducidas en sodio). Jugos de tomate y verduras regulares (que no sean con bajo contenido de sodio o reducidos en sodio). Pepinillos. Aceitunas. ?Granos ?Productos de panificaci?n hechos con grasa, como medialunas, magdalenas y algunos panes. Comidas con arroz o pasta seca listas para usar. ?Carnes y otras prote?nas ?Cortes de carne con alto contenido de grasa. Costillas. Carne frita. [place]. [place], salame y otras carnes precocidas o curadas, como embutidos o panes de carne. Grasa de la espalda del cerdo (panceta). Salchicha de cerdo. Frutos secos y semillas con sal. Frijoles enlatados con agregado de sal. Pescado enlatado o ahumado. Huevos enteros o yemas. Pollo o pavo con piel. ?L?cteos ?Leche entera o al 2 %, crema   y mitad leche y mitad crema. Queso crema entero o con toda su grasa. Yogur entero o endulzado. Quesos con toda su grasa. Sustitutos de cremas no l?cteas. Coberturas batidas. Quesos para untar y quesos procesados. ?Grasas y aceites ?[place]. Margarina en barra. Manteca de cerdo. Lardo. Mantequilla clarificada. Grasa de panceta. Aceites tropicales como aceite de coco, palmiste o palma. ?Ali?os y condimentos ?Sal de cebolla, sal de ajo, sal condimentada, sal de mesa y sal marina. Salsa [place]. Salsa t?rtara. Salsa barbacoa. Salsa teriyaki. Salsa de soja, incluso la que  tiene contenido reducido de sodio. Salsa de carne. Salsas en lata y envasadas. Salsa de pescado. Salsa de ostras. Salsa rosada. R?banos picantes comprados en tiendas. K?tchup. Mostaza. Saborizantes y tie

## 2021-07-27 ENCOUNTER — Ambulatory Visit (INDEPENDENT_AMBULATORY_CARE_PROVIDER_SITE_OTHER): Payer: Self-pay | Admitting: Gastroenterology

## 2021-07-27 ENCOUNTER — Other Ambulatory Visit: Payer: Self-pay

## 2021-07-27 ENCOUNTER — Other Ambulatory Visit (HOSPITAL_COMMUNITY)
Admission: RE | Admit: 2021-07-27 | Discharge: 2021-07-27 | Disposition: A | Discharge status: Home / Self Care | Payer: 59 | Source: Ambulatory Visit | Attending: Gastroenterology | Admitting: Gastroenterology

## 2021-07-27 ENCOUNTER — Encounter: Payer: Self-pay | Admitting: Gastroenterology

## 2021-07-27 VITALS — BP 120/62 | HR 66 | Temp 97.3°F | Ht 59.0 in | Wt 137.8 lb

## 2021-07-27 DIAGNOSIS — Z1211 Encounter for screening for malignant neoplasm of colon: Secondary | ICD-10-CM | POA: Insufficient documentation

## 2021-07-27 DIAGNOSIS — D649 Anemia, unspecified: Secondary | ICD-10-CM | POA: Diagnosis not present

## 2021-07-27 LAB — CBC WITH DIFFERENTIAL/PLATELET
Abs Immature Granulocytes: 0.01 10*3/uL (ref 0.00–0.07)
Basophils Absolute: 0.1 10*3/uL (ref 0.0–0.1)
Basophils Relative: 1 %
Eosinophils Absolute: 0.1 10*3/uL (ref 0.0–0.5)
Eosinophils Relative: 2 %
HCT: 39 % (ref 36.0–46.0)
Hemoglobin: 12.9 g/dL (ref 12.0–15.0)
Immature Granulocytes: 0 %
Lymphocytes Relative: 40 %
Lymphs Abs: 1.8 10*3/uL (ref 0.7–4.0)
MCH: 28.3 pg (ref 26.0–34.0)
MCHC: 33.1 g/dL (ref 30.0–36.0)
MCV: 85.5 fL (ref 80.0–100.0)
Monocytes Absolute: 0.4 10*3/uL (ref 0.1–1.0)
Monocytes Relative: 8 %
Neutro Abs: 2.2 10*3/uL (ref 1.7–7.7)
Neutrophils Relative %: 49 %
Platelets: 255 10*3/uL (ref 150–400)
RBC: 4.56 MIL/uL (ref 3.87–5.11)
RDW: 14.5 % (ref 11.5–15.5)
WBC: 4.5 10*3/uL (ref 4.0–10.5)
nRBC: 0 % (ref 0.0–0.2)

## 2021-07-27 MED ORDER — PEG 3350-KCL-NA BICARB-NACL 420 G PO SOLR: 4000.0000 mL | ORAL | 0 refills | Status: DC

## 2021-07-27 NOTE — Patient Instructions (Signed)
Please go to [facility] for labs. You will be having your blood drawn but you also will need to get a container to submit a stool specimen. For constipation (hard stool), add a fiber supplement such as Benefiber 2 teaspoons daily to twice daily.  Colonoscopy to be done in the near future. You will be provided additional instructions.     1. Uh [facility] para los laboratorios. Le extraern Montez Hageman, pero tambin necesitar un recipiente para enviar una muestra de heces. 2. Para el estreimiento (heces duras), agregue un suplemento de Midwife 2 cucharaditas al da o dos veces al da. 3. Colonoscopia a realizar en un futuro prximo. Se le proporcionarn instrucciones adicionales.   Estreimiento, en adultos Constipation, Adult El estreimiento se produce cuando una persona tiene problemas para defecar (hacer sus deposiciones). Cuando tiene [employer], el nmero de deposiciones es inferior a 3 veces por semana. Las deposiciones (heces) tambin pueden ser secas, duras o ms voluminosas de lo normal. Siga estas instrucciones en su casa: Comida y bebida  Consuma alimentos con alto contenido de [place], por ejemplo: Frutas y verduras frescas. Cereales integrales. Frijoles. Consuma menos alimentos bajos en fibra y con alto contenido de grasas y International aid/development worker, como: Papas fritas. Hamburguesas. Galletas dulces. Caramelos. Gaseosas. Beba suficiente lquido para Radio producer pis (orina) de color amarillo plido. Instrucciones generales Haga actividad fsica con regularidad o segn las indicaciones del mdico. Intente practicar 150 minutos de actividad fsica por semana. Vaya al bao cuando sienta la necesidad de defecar. No se aguante las ganas. Use los medicamentos de venta libre y los recetados solamente como se lo haya indicado el mdico. Estos incluyen los suplementos de [place]. Cuando est defecando: Respire profundamente mientras relaja la parte inferior del  vientre (abdomen). Relaje el suelo plvico. El suelo plvico est formado por un grupo de msculos que sostienen el recto, la vejiga y los intestinos (as como el tero en las mujeres). Controle su afeccin para Insurance risk surveyor cambio. Visite al mdico si advierte lo indicado. Concurra a todas las visitas de [address] se lo haya indicado el mdico. Esto es importante. Comunquese con un mdico si: Su dolor empeora. Tiene fiebre. No ha defecado por 4 das. Vomita. No tiene hambre. Pierde peso. Tiene sangrado por la abertura entre las nalgas (ano). Las deposiciones son delgadas como un lpiz. Solicite ayuda de inmediato si: Lance Muss, y los sntomas empeoran de repente. Tiene prdida de materia fecal u observa [employer]. Siente el vientre ms duro o ms grande de lo normal (hinchado). Siente un dolor muy intenso en el vientre. Se siente mareado o se desmaya. Resumen El estreimiento se produce cuando una persona defeca menos de 3 veces a la semana, tiene problemas para defecar o las heces son secas, duras o ms grandes que lo normal. Consuma alimentos con un alto contenido de [place]. Beba suficiente lquido para Radio producer pis (orina) de color amarillo plido. Use los medicamentos de venta libre y los recetados solamente como se lo haya indicado el mdico. Estos incluyen los suplementos de [place]. Esta informacin no tiene Theme park manager el consejo del mdico. Asegrese de hacerle al mdico cualquier pregunta que tenga. Document Revised: 04/02/2019 Document Reviewed: 04/02/2019 Elsevier Patient Education  2023 [employer].

## 2021-07-27 NOTE — Progress Notes (Signed)
GI Office Note    Referring Provider: Soyla Dryer, PA-C Primary Care Physician:  No primary care provider on file.  Primary Gastroenterologist: Elon Alas. Abbey Chatters, DO   Chief Complaint   Chief Complaint  Patient presents with   Follow-up    Doing better.     History of Present Illness   Janice Wells is a 61 y.o. female presenting today with formal interpreter for follow-up.  Last seen February 2023.  History of GERD, dysphagia, anemia, weight loss.  See below for complicated medical/surgical history/numerous admissions.  Updated labs back in February to follow-up on anemia.  Hemoglobin improved from 8.6 in January 20 23 to 11.5 04/23/21. I requested repeat CBC and H.pylori testing but these have not bee completed due to difficulty communicating with patient, messages left for patient via interpretor services without return response.  Today: continues to feel well. States she complete omeprazole (3 months) back about 3-4 weeks ago. She is feeling well. Her appetite is good. She has continued to gain her weight back after numerous surgeries and hospitalizations as outlined below. She denies ongoing heartburn, dysphagia. No abdominal pain, vomiting. She has a BM every day but a little hard and strains a bit. No melena, brbpr.   Patient weighed between 145-150 pounds before she got ill last year. She reports dropping to 109 pounds at her lowest point. Lowest documented weight in Epic was 118 pounds in 03/2021. She is back up to 137 pounds now.       Patient has complicated recent medical history. She was hospitalized November 25 through November 27 for acute cholecystitis, cholelithiasis.  Completed laparoscopic cholecystectomy on 02/03/2021.  She was readmitted November 29 through December 2 when she presented with abdominal pain and vomiting.  Abdominal x-ray showed some dilated small bowel loops in the left abdomen measuring up to 4 cm containing air-fluid levels.   Air seen throughout nondilated colon to the level of the rectum.  Surgery felt like this was most likely due to ileus from surgery rather than an obstruction.  HIDA scan was negative for bile leak.  LFTs were normal.  Elevated lipase felt to be secondary to nausea/vomiting.  She was started on a PPI.  Patient readmitted December 8 through December 12 for persistent vomiting, epigastric pain.  CT showed right inguinal hernia containing a loop of small bowel.  This is transition point for small bowel obstruction.  Small bowel loops proximal to this level dilated and fluid-filled measuring up to 3.5 cm.  Stomach nondilated.  Small bowel distal to this level decompressed.  Hernia was reduced.  Decision was made to try to hold off on hernia surgery for several weeks out given recent cholecystectomy.  GI was consulted during this admission, seen on December 11 for dysphagia.   During admission she developed dysphagia with taking medication.  She felt like it got stuck in the back of her throat.  Drank a lot of water, felt like it finally passed.  Denied this being a chronic issue for her.  Complained of heartburn, persistent epigastric pain.  No chronic NSAID use.  No prior EGD.  No prior colonoscopy.  Recommended continue PPI, monitor symptoms.  Return to the office for follow-up to consider screening colonoscopy.   Patient was admitted December 20 through 26 with lower abdominal pain, partial small bowel obstruction versus ileus.  She went to surgery for right inguinal herniorrhaphy,was noted to have incarcerated small bowel attached to the hernia sac.  She had  19 cm of small bowel removed.   Patient readmitted December 31 through January 4 with swelling and erythema at the incision site on the right lower quadrant.  Required I&D at bedside.  Cultures grew E. coli.  Outpatient wound care required 3 times weekly at [facility].  Medications   No current outpatient medications on file.   No current  facility-administered medications for this visit.    Allergies   Allergies as of 07/27/2021   (No Known Allergies)     Past Medical History   Past Medical History:  Diagnosis Date   Acute cholecystitis 02/02/2021   GERD without esophagitis 11/27/2020   Obesity, unspecified 02/15/2015    Past Surgical History   Past Surgical History:  Procedure Laterality Date   BOWEL RESECTION N/A 03/02/2021   Procedure: PARTIAL SMALL BOWEL RESECTION;  Surgeon: Aviva Signs, MD;  Location: [facility];  Service: General;  Laterality: N/A;   CHOLECYSTECTOMY N/A 02/03/2021   Procedure: LAPAROSCOPIC CHOLECYSTECTOMY;  Surgeon: Aviva Signs, MD;  Location: [facility];  Service: General;  Laterality: N/A;   INGUINAL HERNIA REPAIR Right 03/02/2021   Procedure: HERNIA REPAIR INGUINAL ADULT;  Surgeon: Aviva Signs, MD;  Location: [facility];  Service: General;  Laterality: Right;    Past Family History   Family History  Problem Relation Age of Onset   Diabetes Sister    Cancer Brother    Colon cancer Neg Hx     Past Social History   Social History   Socioeconomic History   Marital status: Single    Spouse name: Not on file   Number of children: Not on file   Years of education: Not on file   Highest education level: Not on file  Occupational History   Not on file  Tobacco Use   Smoking status: Never    Passive exposure: Never   Smokeless tobacco: Never  Substance and Sexual Activity   Alcohol use: No   Drug use: No   Sexual activity: Yes  Other Topics Concern   Not on file  Social History Narrative   ** Merged History Encounter **       Social Determinants of Health   Financial Resource Strain: Not on file  Food Insecurity: Not on file  Transportation Needs: Not on file  Physical Activity: Not on file  Stress: Not on file  Social Connections: Not on file  Intimate Partner Violence: Not on file    Review of Systems   General: Negative for anorexia, weight loss, fever, chills,  fatigue, weakness. ENT: Negative for hoarseness, difficulty swallowing , nasal congestion. CV: Negative for chest pain, angina, palpitations, dyspnea on exertion, peripheral edema.  Respiratory: Negative for dyspnea at rest, dyspnea on exertion, cough, sputum, wheezing.  GI: See history of present illness. GU:  Negative for dysuria, hematuria, urinary incontinence, urinary frequency, nocturnal urination.  Endo: Negative for unusual weight change.     Physical Exam   BP 120/62 (BP Location: Right Arm, Patient Position: Sitting, Cuff Size: Normal)   Pulse 66   Temp (!) 97.3 F (36.3 C) (Temporal)   Ht 4\' 11"  (1.499 m)   Wt 137 lb 12.8 oz (62.5 kg)   LMP 03/11/2008 (Approximate)   SpO2 98%   BMI 27.83 kg/m    General: Well-nourished, well-developed in no acute distress.  Eyes: No icterus. Mouth: Oropharyngeal mucosa moist and pink , no lesions erythema or exudate. Lungs: Clear to auscultation bilaterally.  Heart: Regular rate and rhythm, no murmurs rubs or gallops.  Abdomen: Bowel sounds are normal, nontender, nondistended, no hepatosplenomegaly or masses,  no abdominal bruits or hernia , no rebound or guarding.  Rectal: not performed  Extremities: No lower extremity edema. No clubbing or deformities. Neuro: Alert and oriented x 4   Skin: Warm and dry, no jaundice.   Psych: Alert and cooperative, normal mood and affect.  Labs   Lab Results  Component Value Date   CREATININE 0.36 (L) 03/14/2021   BUN <5 (L) 03/13/2021   NA 136 03/13/2021   K 4.3 03/13/2021   CL 106 03/13/2021   CO2 24 03/13/2021   Lab Results  Component Value Date   ALT 36 03/10/2021   AST 34 03/10/2021   ALKPHOS 109 03/10/2021   BILITOT 0.4 03/10/2021   Lab Results  Component Value Date   WBC 5.4 04/23/2021   HGB 11.5 (L) 04/23/2021   HCT 35.5 (L) 04/23/2021   MCV 91.0 04/23/2021   PLT 260 04/23/2021   No results found for: IRON, TIBC, FERRITIN No results found for: VITAMINB12 No results  found for: FOLATE  Imaging Studies   No results found.  Assessment   Anemia:  During illness described above, her Hgb dropped to 8.0. In 04/2021, Hgb back to 11.5. likely multifactorial in setting of several illnesses, surgeries. We will recheck today along with iron/ferritin. No reported melena, brbpr.  UGI symptoms (early satiety, nausea, heartburn, dysphagia/epigastric pain): symptoms likely exacerbated by multiple surgeries, antibiotics, hospitalizations. She was already improving back in 04/2021. She had been on omeprazole for GERD and for possible gastritis. She completed 3 months of PPI with complete resolution of all of her symptoms. We previously discussed checking for H.pylori, treat if present. Since she is feeling better, would not need an upper endoscopy. Will keep her off PPI as long as she is feeling better.  Colon cancer screening: no prior colonoscopy. No FH of colon cancer. Patient desires screening.    PLAN   CBC, iron/tibc/ferritin. H.pylori stool antigen. Colonoscopy with Dr. Abbey Chatters. ASA 2.  I have discussed the risks, alternatives, benefits with regards to but not limited to the risk of reaction to medication, bleeding, infection, perforation and the patient is agreeable to proceed. Written consent to be obtained. Prep/process discussed at length with patient while here in the office, with use of formal interpretor. She was provided written instructions in Spanish.   Laureen Ochs. Bobby Rumpf, [place], [facility]

## 2021-08-06 ENCOUNTER — Other Ambulatory Visit (HOSPITAL_COMMUNITY)
Admission: RE | Admit: 2021-08-06 | Discharge: 2021-08-06 | Disposition: A | Discharge status: Home / Self Care | Payer: 59 | Source: Other Acute Inpatient Hospital | Attending: Gastroenterology | Admitting: Gastroenterology

## 2021-08-06 DIAGNOSIS — D649 Anemia, unspecified: Secondary | ICD-10-CM | POA: Diagnosis not present

## 2021-08-06 LAB — IRON AND TIBC
Iron: 120 ug/dL (ref 28–170)
Saturation Ratios: 38 % — ABNORMAL HIGH (ref 10.4–31.8)
TIBC: 318 ug/dL (ref 250–450)
UIBC: 198 ug/dL

## 2021-08-06 LAB — FERRITIN: Ferritin: 64 ng/mL (ref 11–307)

## 2021-08-07 LAB — H. PYLORI ANTIGEN, STOOL: H. Pylori Stool Ag, Eia: NEGATIVE

## 2021-08-20 ENCOUNTER — Other Ambulatory Visit: Payer: Self-pay | Admitting: Gastroenterology

## 2021-08-28 ENCOUNTER — Other Ambulatory Visit: Payer: Self-pay

## 2021-08-28 ENCOUNTER — Ambulatory Visit (HOSPITAL_BASED_OUTPATIENT_CLINIC_OR_DEPARTMENT_OTHER): Payer: 59 | Admitting: Anesthesiology

## 2021-08-28 ENCOUNTER — Encounter (HOSPITAL_COMMUNITY)
Admission: RE | Disposition: A | Discharge status: Home / Self Care | Payer: Self-pay | Source: Home / Self Care | Attending: Internal Medicine

## 2021-08-28 ENCOUNTER — Encounter (HOSPITAL_COMMUNITY): Payer: Self-pay

## 2021-08-28 ENCOUNTER — Ambulatory Visit (HOSPITAL_COMMUNITY): Payer: 59 | Admitting: Anesthesiology

## 2021-08-28 ENCOUNTER — Ambulatory Visit (HOSPITAL_COMMUNITY)
Admission: RE | Admit: 2021-08-28 | Discharge: 2021-08-28 | Disposition: A | Discharge status: Home / Self Care | Payer: 59 | Attending: Internal Medicine | Admitting: Internal Medicine

## 2021-08-28 DIAGNOSIS — R112 Nausea with vomiting, unspecified: Secondary | ICD-10-CM

## 2021-08-28 DIAGNOSIS — E876 Hypokalemia: Secondary | ICD-10-CM

## 2021-08-28 DIAGNOSIS — E669 Obesity, unspecified: Secondary | ICD-10-CM

## 2021-08-28 DIAGNOSIS — K4090 Unilateral inguinal hernia, without obstruction or gangrene, not specified as recurrent: Secondary | ICD-10-CM

## 2021-08-28 DIAGNOSIS — R1312 Dysphagia, oropharyngeal phase: Secondary | ICD-10-CM

## 2021-08-28 DIAGNOSIS — Z1211 Encounter for screening for malignant neoplasm of colon: Secondary | ICD-10-CM

## 2021-08-28 DIAGNOSIS — R1084 Generalized abdominal pain: Secondary | ICD-10-CM

## 2021-08-28 DIAGNOSIS — D649 Anemia, unspecified: Secondary | ICD-10-CM

## 2021-08-28 DIAGNOSIS — K56609 Unspecified intestinal obstruction, unspecified as to partial versus complete obstruction: Secondary | ICD-10-CM

## 2021-08-28 DIAGNOSIS — K9189 Other postprocedural complications and disorders of digestive system: Secondary | ICD-10-CM

## 2021-08-28 DIAGNOSIS — K219 Gastro-esophageal reflux disease without esophagitis: Secondary | ICD-10-CM

## 2021-08-28 DIAGNOSIS — K4030 Unilateral inguinal hernia, with obstruction, without gangrene, not specified as recurrent: Secondary | ICD-10-CM

## 2021-08-28 DIAGNOSIS — R1310 Dysphagia, unspecified: Secondary | ICD-10-CM

## 2021-08-28 DIAGNOSIS — Z789 Other specified health status: Secondary | ICD-10-CM

## 2021-08-28 DIAGNOSIS — R1013 Epigastric pain: Secondary | ICD-10-CM

## 2021-08-28 DIAGNOSIS — R6881 Early satiety: Secondary | ICD-10-CM

## 2021-08-28 DIAGNOSIS — T8149XA Infection following a procedure, other surgical site, initial encounter: Secondary | ICD-10-CM

## 2021-08-28 DIAGNOSIS — K5900 Constipation, unspecified: Secondary | ICD-10-CM

## 2021-08-28 HISTORY — PX: FLEXIBLE SIGMOIDOSCOPY: SHX5431

## 2021-08-28 SURGERY — SIGMOIDOSCOPY, FLEXIBLE
Anesthesia: General

## 2021-08-28 MED ORDER — PROPOFOL 10 MG/ML IV BOLUS
INTRAVENOUS | Status: DC | PRN
  Administered 2021-08-28: 100 mg via INTRAVENOUS

## 2021-08-28 MED ORDER — LIDOCAINE HCL (CARDIAC) PF 100 MG/5ML IV SOSY
PREFILLED_SYRINGE | INTRAVENOUS | Status: DC | PRN
  Administered 2021-08-28: 50 mg via INTRAVENOUS

## 2021-08-28 MED ORDER — LACTATED RINGERS IV SOLN: INTRAVENOUS | Status: DC

## 2021-08-28 NOTE — H&P (Signed)
Primary Care Physician:  Patient, No Pcp Per Primary Gastroenterologist:  Dr. Marletta Lor  Pre-Procedure History & Physical: HPI:  Janice Wells is a 61 y.o. female is here for a colonoscopy for colon cancer screening purposes.  Patient denies any family history of colorectal cancer.  No melena or hematochezia.  No abdominal pain or unintentional weight loss.  No change in bowel habits.  Overall feels well from a GI standpoint.  Past Medical History:  Diagnosis Date   Acute cholecystitis 02/02/2021   GERD without esophagitis 11/27/2020   Obesity, unspecified 02/15/2015    Past Surgical History:  Procedure Laterality Date   BOWEL RESECTION N/A 03/02/2021   Procedure: PARTIAL SMALL BOWEL RESECTION;  Surgeon: Franky Macho, MD;  Location: [facility];  Service: General;  Laterality: N/A;   CHOLECYSTECTOMY N/A 02/03/2021   Procedure: LAPAROSCOPIC CHOLECYSTECTOMY;  Surgeon: Franky Macho, MD;  Location: [facility];  Service: General;  Laterality: N/A;   INGUINAL HERNIA REPAIR Right 03/02/2021   Procedure: HERNIA REPAIR INGUINAL ADULT;  Surgeon: Franky Macho, MD;  Location: [facility];  Service: General;  Laterality: Right;    Prior to Admission medications   Medication Sig Start Date End Date Taking? Authorizing Provider  polyethylene glycol-electrolytes (TRILYTE) 420 g solution Take 4,000 mLs by mouth as directed. 07/27/21  Yes Lanelle Bal, DO    Allergies as of 07/27/2021   (No Known Allergies)    Family History  Problem Relation Age of Onset   Diabetes Sister    Cancer Brother    Colon cancer Neg Hx     Social History   Socioeconomic History   Marital status: Single    Spouse name: Not on file   Number of children: Not on file   Years of education: Not on file   Highest education level: Not on file  Occupational History   Not on file  Tobacco Use   Smoking status: Never    Passive exposure: Never   Smokeless tobacco: Never  Substance and Sexual Activity   Alcohol  use: No   Drug use: No   Sexual activity: Yes  Other Topics Concern   Not on file  Social History Narrative   ** Merged History Encounter **       Social Determinants of Health   Financial Resource Strain: Not on file  Food Insecurity: Not on file  Transportation Needs: Not on file  Physical Activity: Not on file  Stress: Not on file  Social Connections: Not on file  Intimate Partner Violence: Not on file    Review of Systems: See HPI, otherwise negative ROS  Physical Exam: Vital signs in last 24 hours: Temp:  [97.7 F (36.5 C)] 97.7 F (36.5 C) (06/20 0912) Resp:  [15] 15 (06/20 0912) BP: (140)/(87) 140/87 (06/20 0912) SpO2:  [99 %] 99 % (06/20 0912) Weight:  [63 kg] 63 kg (06/20 0856)   General:   Alert,  Well-developed, well-nourished, pleasant and cooperative in NAD Head:  Normocephalic and atraumatic. Eyes:  Sclera clear, no icterus.   Conjunctiva pink. Ears:  Normal auditory acuity. Nose:  No deformity, discharge,  or lesions. Mouth:  No deformity or lesions, dentition normal. Neck:  Supple; no masses or thyromegaly. Lungs:  Clear throughout to auscultation.   No wheezes, crackles, or rhonchi. No acute distress. Heart:  Regular rate and rhythm; no murmurs, clicks, rubs,  or gallops. Abdomen:  Soft, nontender and nondistended. No masses, hepatosplenomegaly or hernias noted. Normal bowel sounds, without guarding, and without rebound.  Msk:  Symmetrical without gross deformities. Normal posture. Extremities:  Without clubbing or edema. Neurologic:  Alert and  oriented x4;  grossly normal neurologically. Skin:  Intact without significant lesions or rashes. Cervical Nodes:  No significant cervical adenopathy. Psych:  Alert and cooperative. Normal mood and affect.  Impression/Plan: [name] is here for a colonoscopy to be performed for colon cancer screening purposes.  The risks of the procedure including infection, bleed, or perforation as well  as benefits, limitations, alternatives and imponderables have been reviewed with the patient. Questions have been answered. All parties agreeable.

## 2021-08-28 NOTE — Anesthesia Postprocedure Evaluation (Signed)
Anesthesia Post Note  Patient: [place]  Procedure(s) Performed: FLEXIBLE SIGMOIDOSCOPY  Patient location during evaluation: Phase II Anesthesia Type: General Level of consciousness: awake and alert and oriented Pain management: pain level controlled Vital Signs Assessment: post-procedure vital signs reviewed and stable Respiratory status: spontaneous breathing and respiratory function stable Cardiovascular status: stable and blood pressure returned to baseline Postop Assessment: no apparent nausea or vomiting Anesthetic complications: no   No notable events documented.   Last Vitals:  Vitals:   08/28/21 0912 08/28/21 0930  BP: 140/87 (!) 96/47  Resp: 15 13  Temp: 36.5 C (!) 36.4 C  SpO2: 99% 97%    Last Pain:  Vitals:   08/28/21 0930  TempSrc: Oral  PainSc: 0-No pain                 Horton Ellithorpe C Geraldine Sandberg

## 2021-08-28 NOTE — Anesthesia Preprocedure Evaluation (Addendum)
Anesthesia Evaluation  Patient identified by MRN, date of birth, ID band Patient awake    Reviewed: Allergy & Precautions, H&P , NPO status , Patient's Chart, lab work & pertinent test results  Airway Mallampati: II  TM Distance: >3 FB Neck ROM: Full    Dental  (+) Dental Advisory Given, Loose, Missing, Poor Dentition,  Bottom tooth is very loose:   Pulmonary neg pulmonary ROS,    Pulmonary exam normal breath sounds clear to auscultation       Cardiovascular Exercise Tolerance: Good negative cardio ROS Normal cardiovascular exam Rhythm:Regular Rate:Normal  27-Feb-2021 11:28:20 [facility] ROUTINE RECORD 1960/11/22 (60 yr) Female Other PR interval 131 ms QRS duration 96 ms QT/QTcB 326/443 ms P-R-T axes 68 67 18 Sinus tachycardia   Neuro/Psych negative neurological ROS  negative psych ROS   GI/Hepatic Neg liver ROS, GERD  Medicated,  Endo/Other  negative endocrine ROS  Renal/GU negative Renal ROS  negative genitourinary   Musculoskeletal negative musculoskeletal ROS (+)   Abdominal   Peds negative pediatric ROS (+)  Hematology  (+) Blood dyscrasia, anemia ,   Anesthesia Other Findings   Reproductive/Obstetrics negative OB ROS                            Anesthesia Physical  Anesthesia Plan  ASA: 2  Anesthesia Plan: General   Post-op Pain Management: Minimal or no pain anticipated   Induction: Intravenous  PONV Risk Score and Plan: Propofol infusion  Airway Management Planned: Nasal Cannula and Natural Airway  Additional Equipment:   Intra-op Plan:   Post-operative Plan:   Informed Consent: I have reviewed the patients History and Physical, chart, labs and discussed the procedure including the risks, benefits and alternatives for the proposed anesthesia with the patient or authorized representative who has indicated his/her understanding and acceptance.      Dental advisory given and Interpreter used for interveiw  Plan Discussed with: CRNA and Surgeon  Anesthesia Plan Comments: (Risk of loosing and possible aspirating teeth were explained.)       Anesthesia Quick Evaluation

## 2021-08-28 NOTE — Discharge Instructions (Addendum)
Lamentablemente, su colon no se prepar adecuadamente hoy para la colonoscopia. Recomendara repetir la colonoscopia en 3-6 meses con una preparacin de colon diferente.

## 2021-08-28 NOTE — Progress Notes (Signed)
Interpreter-Ibis [id number] used for pre-op nurses assessment.

## 2021-08-28 NOTE — Transfer of Care (Signed)
Immediate Anesthesia Transfer of Care Note  Patient: Janice Wells  Procedure(s) Performed: FLEXIBLE SIGMOIDOSCOPY  Patient Location: Endoscopy Unit  Anesthesia Type:General  Level of Consciousness: drowsy  Airway & Oxygen Therapy: Patient Spontanous Breathing  Post-op Assessment: Report given to RN and Post -op Vital signs reviewed and stable  Post vital signs: Reviewed and stable  Last Vitals:  Vitals Value Taken Time  BP    Temp    Pulse 73   Resp 17   SpO2 97%     Last Pain:  Vitals:   08/28/21 0912  TempSrc: Oral  PainSc:       Patients Stated Pain Goal: 5 (08/28/21 0856)  Complications: No notable events documented.

## 2021-08-28 NOTE — Op Note (Signed)
[facility] Patient Name: Janice Wells Procedure Date: 08/28/2021 9:06 AM MRN: [id number] Date of Birth: 1960-08-04 Attending MD: Elon Alas. Abbey Chatters DO CSN: [id number] Age: 61 Admit Type: Outpatient Procedure:                Colonoscopy Indications:              Screening for colorectal malignant neoplasm Providers:                Elon Alas. Abbey Chatters, DO, Janeece Riggers, RN, [place] Page Referring MD:              Medicines:                See the Anesthesia note for documentation of the                            administered medications Complications:            No immediate complications. Estimated Blood Loss:     Estimated blood loss: none. Procedure:                Pre-Anesthesia Assessment:                           - The anesthesia plan was to use monitored                            anesthesia care (MAC).                           After obtaining informed consent, the colonoscope                            was passed under direct vision. Throughout the                            procedure, the patient's blood pressure, pulse, and                            oxygen saturations were monitored continuously. The                            PCF-HQ190L ([id number]) was introduced through the                            anus with the intention of advancing to the cecum.                            The scope was advanced to the sigmoid colon before                            the procedure was aborted. Medications were given.                            The colonoscopy was performed without difficulty.  The patient tolerated the procedure well. The                            quality of the bowel preparation was evaluated                            using the BBPS [facility] Bowel Preparation Scale)                            with scores of: Left Colon = 0 (unprepared, mucosa                            not seen due to solid stool that cannot be cleared                             or unseen proximal colon segment in a colonoscopy                            aborted due to inadequate bowel prep). The total                            BBPS score equals 0. The quality of the bowel                            preparation was inadequate. Scope In: 9:25:17 AM Scope Out: 9:26:20 AM Total Procedure Duration: 0 hours 1 minute 3 seconds  Findings:      The perianal and digital rectal examinations were normal.      A large amount of solid stool was found in the recto-sigmoid colon and       in the sigmoid colon, precluding visualization. Impression:               - Preparation of the colon was inadequate.                           - Stool in the recto-sigmoid colon and in the                            sigmoid colon.                           - No specimens collected. Moderate Sedation:      Per Anesthesia Care Recommendation:           - Patient has a contact number available for                            emergencies. The signs and symptoms of potential                            delayed complications were discussed with the                            patient. Return to normal activities tomorrow.  Written discharge instructions were provided to the                            patient.                           - Resume previous diet.                           - Continue present medications.                           - Repeat colonoscopy in 3 months because the bowel                            preparation was poor. Procedure Code(s):        --- Professional ---                           N0539, 53, Colorectal cancer screening; colonoscopy                            on individual not meeting criteria for high risk Diagnosis Code(s):        --- Professional ---                           Z12.11, Encounter for screening for malignant                            neoplasm of colon CPT copyright 2019 American Medical Association. All  rights reserved. The codes documented in this report are preliminary and upon coder review may  be revised to meet current compliance requirements. Elon Alas. Abbey Chatters, DO [place] Abbey Chatters, DO 08/28/2021 9:28:24 AM This report has been signed electronically. Number of Addenda: 0

## 2021-09-04 ENCOUNTER — Encounter (HOSPITAL_COMMUNITY): Payer: Self-pay | Admitting: Internal Medicine

## 2021-10-10 ENCOUNTER — Encounter: Payer: Self-pay | Admitting: Physician Assistant

## 2021-10-10 ENCOUNTER — Ambulatory Visit: Payer: Self-pay | Admitting: Physician Assistant

## 2021-10-10 VITALS — BP 144/80 | HR 66 | Temp 96.1°F | Wt 144.0 lb

## 2021-10-10 DIAGNOSIS — S39012A Strain of muscle, fascia and tendon of lower back, initial encounter: Secondary | ICD-10-CM

## 2021-10-10 DIAGNOSIS — R109 Unspecified abdominal pain: Secondary | ICD-10-CM

## 2021-10-10 DIAGNOSIS — Z789 Other specified health status: Secondary | ICD-10-CM

## 2021-10-10 LAB — POCT URINALYSIS DIPSTICK
Bilirubin, UA: NEGATIVE
Blood, UA: NEGATIVE
Glucose, UA: NEGATIVE
Ketones, UA: NEGATIVE
Nitrite, UA: NEGATIVE
Protein, UA: NEGATIVE
Spec Grav, UA: >=1.03 — AB (ref 1.010–1.025)
Urobilinogen, UA: 0.2 E.U./dL
pH, UA: 6 (ref 5.0–8.0)

## 2021-10-10 MED ORDER — DICLOFENAC SODIUM 1 % EX GEL: 2.0000 g | Freq: Four times a day (QID) | CUTANEOUS | 0 refills | Status: DC

## 2021-10-10 NOTE — Progress Notes (Signed)
BP (!) 144/80   Pulse 66   Temp (!) 96.1 F (35.6 C)   Wt 144 lb (65.3 kg)   LMP 03/11/2008 (Approximate)   SpO2 99%   BMI 29.08 kg/m    Subjective:    Patient ID: Janice Wells, female    DOB: November 04, 1960, 61 y.o.   MRN: [id number]  HPI: Janice Wells is a 61 y.o. female presenting on 10/10/2021 for Back Pain (Pt feels pain on mid low back which radiates to L leg down to her calf. Has being hurting for over a year. Pt has used tylenol which doesn't really help. Pt states it hurts worse when she is lying down.)   HPI    Chief Complaint  Patient presents with   Back Pain    Pt feels pain on mid low back which radiates to L leg down to her calf. Has being hurting for over a year. Pt has used tylenol which doesn't really help. Pt states it hurts worse when she is lying down.    No self treatment with OTC except for last Friday she took just one pill of tylenol which helped a little bit.    She does not work.  She has had the pain for a year and it comes and goes.  This time it has been hurting her for a week.   She c/o left knee pain- the back started hurting first.    Her stomach feels good.  She has been doing nothing/no injury or unusual activity out of the usual.    Relevant past medical, surgical, family and social history reviewed and updated as indicated. Interim medical history since our last visit reviewed. Allergies and medications reviewed and updated.    No current outpatient medications on file.    Review of Systems  Per HPI unless specifically indicated above     Objective:    BP (!) 144/80   Pulse 66   Temp (!) 96.1 F (35.6 C)   Wt 144 lb (65.3 kg)   LMP 03/11/2008 (Approximate)   SpO2 99%   BMI 29.08 kg/m   Wt Readings from Last 3 Encounters:  10/10/21 144 lb (65.3 kg)  08/28/21 139 lb (63 kg)  07/27/21 137 lb 12.8 oz (62.5 kg)    Physical Exam Vitals reviewed.  Constitutional:      General: She is not in acute  distress.    Appearance: She is well-developed. She is not ill-appearing or toxic-appearing.  HENT:     Head: Normocephalic and atraumatic.  Cardiovascular:     Rate and Rhythm: Normal rate and regular rhythm.     Pulses:          Dorsalis pedis pulses are 2+ on the right side and 2+ on the left side.  Pulmonary:     Effort: Pulmonary effort is normal.     Breath sounds: Normal breath sounds.  Abdominal:     General: Bowel sounds are normal.     Palpations: Abdomen is soft. There is no hepatomegaly, splenomegaly, mass or pulsatile mass.     Tenderness: There is no abdominal tenderness. There is no right CVA tenderness, left CVA tenderness, guarding or rebound.  Musculoskeletal:     Cervical back: Neck supple.     Thoracic back: Normal.     Lumbar back: Normal. No tenderness or bony tenderness. Negative right straight leg raise test and negative left straight leg raise test.     Left knee: Normal. No swelling or bony  tenderness. Normal range of motion. No tenderness. Normal alignment and normal patellar mobility.     Right lower leg: No edema.     Left lower leg: No edema.  Lymphadenopathy:     Cervical: No cervical adenopathy.  Skin:    General: Skin is warm and dry.  Neurological:     Mental Status: She is alert and oriented to person, place, and time.  Psychiatric:        Behavior: Behavior normal.    UA wnl     Assessment & Plan:   Encounter Diagnoses  Name Primary?   Back strain, initial encounter Yes   Flank pain    Not proficient in English language      -recommend Voltaren gel and APAP and heat -discovered that pt has had insurance since January.    She is notified that she is no longer eligible to receive service at [facility].  She states understanding

## 2021-10-22 ENCOUNTER — Ambulatory Visit: Payer: Self-pay | Admitting: Physician Assistant

## 2021-12-31 ENCOUNTER — Ambulatory Visit: Payer: Self-pay | Admitting: Gastroenterology

## 2021-12-31 NOTE — Progress Notes (Deleted)
GI Office Note    Referring Provider: No ref. provider found Primary Care Physician:  Patient, No Pcp Per  Primary Gastroenterologist:  Chief Complaint   No chief complaint on file.   History of Present Illness   Janice Wells is a 61 y.o. female presenting today to schedule colonoscopy. Attempted colonoscopy June 2023 but bowel preparation was poor stool noted in the rectosigmoid colon.  She also has a history of GERD, dysphagia, anemia, weight loss.  See complicated past medical history below.  H. pylori stool antigen negative.  Labs in May 2023 showed resolution of anemia.       Patient has complicated recent medical history. She was hospitalized November 25 through November 27 for acute cholecystitis, cholelithiasis.  Completed laparoscopic cholecystectomy on 02/03/2021.  She was readmitted November 29 through December 2 when she presented with abdominal pain and vomiting.  Abdominal x-ray showed some dilated small bowel loops in the left abdomen measuring up to 4 cm containing air-fluid levels.  Air seen throughout nondilated colon to the level of the rectum.  Surgery felt like this was most likely due to ileus from surgery rather than an obstruction.  HIDA scan was negative for bile leak.  LFTs were normal.  Elevated lipase felt to be secondary to nausea/vomiting.  She was started on a PPI.  Patient readmitted December 8 through December 12 for persistent vomiting, epigastric pain.  CT showed right inguinal hernia containing a loop of small bowel.  This is transition point for small bowel obstruction.  Small bowel loops proximal to this level dilated and fluid-filled measuring up to 3.5 cm.  Stomach nondilated.  Small bowel distal to this level decompressed.  Hernia was reduced.  Decision was made to try to hold off on hernia surgery for several weeks out given recent cholecystectomy.  GI was consulted during this admission, seen on December 11 for dysphagia.   During  admission she developed dysphagia with taking medication.  She felt like it got stuck in the back of her throat.  Drank a lot of water, felt like it finally passed.  Denied this being a chronic issue for her.  Complained of heartburn, persistent epigastric pain.  No chronic NSAID use.  No prior EGD.  No prior colonoscopy.  Recommended continue PPI, monitor symptoms.  Return to the office for follow-up to consider screening colonoscopy.   Patient was admitted December 20 through 26 with lower abdominal pain, partial small bowel obstruction versus ileus.  She went to surgery for right inguinal herniorrhaphy,was noted to have incarcerated small bowel attached to the hernia sac.  She had 19 cm of small bowel removed.   Patient readmitted December 31 through January 4 with swelling and erythema at the incision site on the right lower quadrant.  Required I&D at bedside.  Cultures grew E. coli.  Outpatient wound care required 3 times weekly at [facility].    Medications   Current Outpatient Medications  Medication Sig Dispense Refill   diclofenac Sodium (VOLTAREN) 1 % GEL Apply 2 g topically 4 (four) times daily. 50 g 0   No current facility-administered medications for this visit.    Allergies   Allergies as of 12/31/2021   (No Known Allergies)     Past Medical History   Past Medical History:  Diagnosis Date   Acute cholecystitis 02/02/2021   GERD without esophagitis 11/27/2020   Obesity, unspecified 02/15/2015    Past Surgical History   Past Surgical History:  Procedure Laterality  Date   BOWEL RESECTION N/A 03/02/2021   Procedure: PARTIAL SMALL BOWEL RESECTION;  Surgeon: Aviva Signs, MD;  Location: [facility];  Service: General;  Laterality: N/A;   CHOLECYSTECTOMY N/A 02/03/2021   Procedure: LAPAROSCOPIC CHOLECYSTECTOMY;  Surgeon: Aviva Signs, MD;  Location: [facility];  Service: General;  Laterality: N/A;   FLEXIBLE SIGMOIDOSCOPY  08/28/2021   Procedure: FLEXIBLE SIGMOIDOSCOPY;  Surgeon:  Eloise Harman, DO;  Location: [facility];  Service: Endoscopy;;   INGUINAL HERNIA REPAIR Right 03/02/2021   Procedure: HERNIA REPAIR INGUINAL ADULT;  Surgeon: Aviva Signs, MD;  Location: [facility];  Service: General;  Laterality: Right;    Past Family History   Family History  Problem Relation Age of Onset   Diabetes Sister    Cancer Brother    Colon cancer Neg Hx     Past Social History   Social History   Socioeconomic History   Marital status: Single    Spouse name: Not on file   Number of children: Not on file   Years of education: Not on file   Highest education level: Not on file  Occupational History   Not on file  Tobacco Use   Smoking status: Never    Passive exposure: Never   Smokeless tobacco: Never  Substance and Sexual Activity   Alcohol use: No   Drug use: No   Sexual activity: Yes  Other Topics Concern   Not on file  Social History Narrative   ** Merged History Encounter **       Social Determinants of Health   Financial Resource Strain: Not on file  Food Insecurity: Not on file  Transportation Needs: Not on file  Physical Activity: Not on file  Stress: Not on file  Social Connections: Not on file  Intimate Partner Violence: Not on file    Review of Systems   General: Negative for anorexia, weight loss, fever, chills, fatigue, weakness. ENT: Negative for hoarseness, difficulty swallowing , nasal congestion. CV: Negative for chest pain, angina, palpitations, dyspnea on exertion, peripheral edema.  Respiratory: Negative for dyspnea at rest, dyspnea on exertion, cough, sputum, wheezing.  GI: See history of present illness. GU:  Negative for dysuria, hematuria, urinary incontinence, urinary frequency, nocturnal urination.  Endo: Negative for unusual weight change.     Physical Exam   LMP 03/11/2008 (Approximate)    General: Well-nourished, well-developed in no acute distress.  Eyes: No icterus. Mouth: Oropharyngeal mucosa moist and  pink , no lesions erythema or exudate. Lungs: Clear to auscultation bilaterally.  Heart: Regular rate and rhythm, no murmurs rubs or gallops.  Abdomen: Bowel sounds are normal, nontender, nondistended, no hepatosplenomegaly or masses,  no abdominal bruits or hernia , no rebound or guarding.  Rectal: ***  Extremities: No lower extremity edema. No clubbing or deformities. Neuro: Alert and oriented x 4   Skin: Warm and dry, no jaundice.   Psych: Alert and cooperative, normal mood and affect.  Labs   *** Imaging Studies   No results found.  Assessment       PLAN   ***   Laureen Ochs. Bobby Rumpf, [place], [facility]

## 2022-07-08 IMAGING — CT CT ABD-PELV W/ CM
2 of 5 series · 15 of 46 positions shown, 17 images · IV contrast (Omnipaque or Isovue)
Comparison: 02/15/2021.

CLINICAL DATA: Postop. On fluid collection. Abdominal pain. Recent
hernia surgery.

EXAM:
CT ABDOMEN AND PELVIS WITH CONTRAST
TECHNIQUE: Multidetector CT imaging of the abdomen and pelvis was performed
using the standard protocol following bolus administration of
intravenous contrast.
CONTRAST:  100mL OMNIPAQUE IOHEXOL 300 MG/ML  SOLN

[Series 2: axial st · axial · 0.95mm/px · z∈[+721,+1161]mm · 12 of 100 slices shown, 14 images]
[im 6/100  soft-tissue]
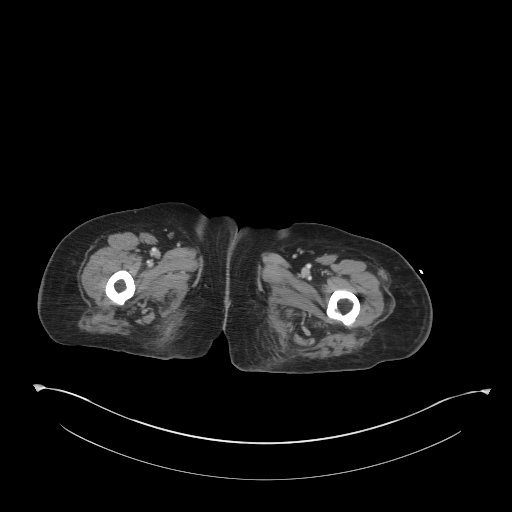
[im 6/100  bone]
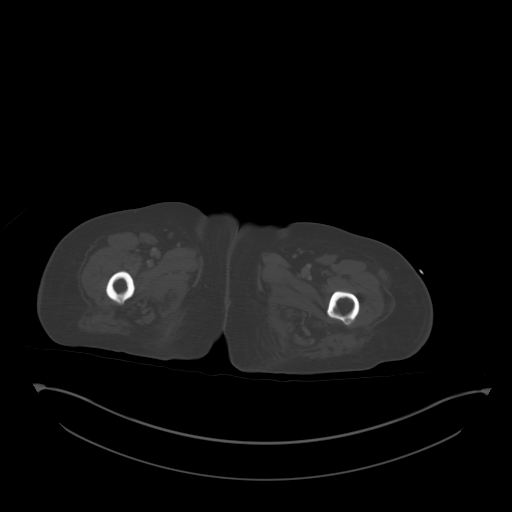
[im 16/100  soft-tissue]
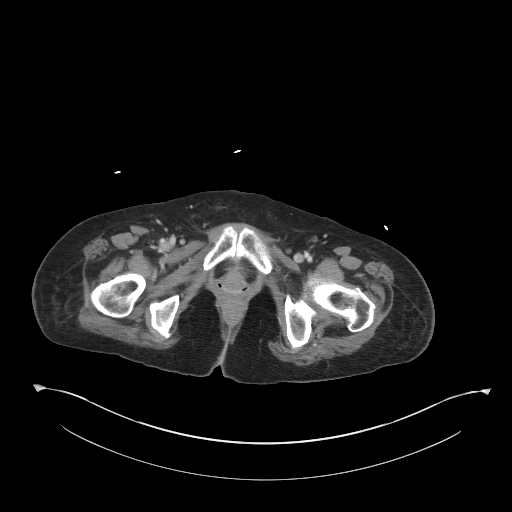
[im 21/100  soft-tissue]
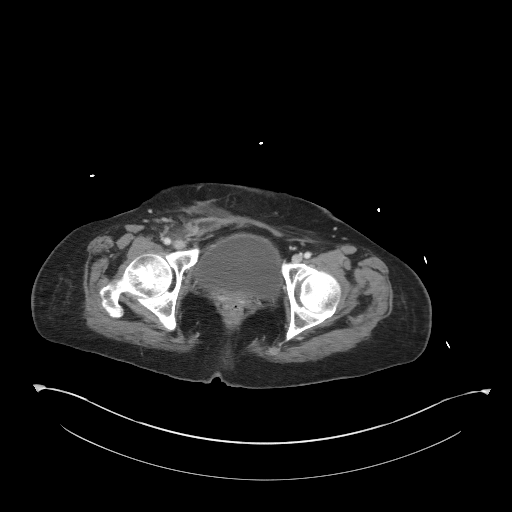
[im 32/100  soft-tissue]
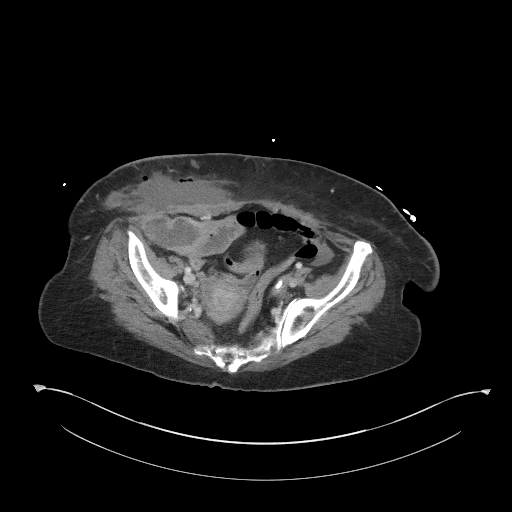
[im 37/100  soft-tissue]
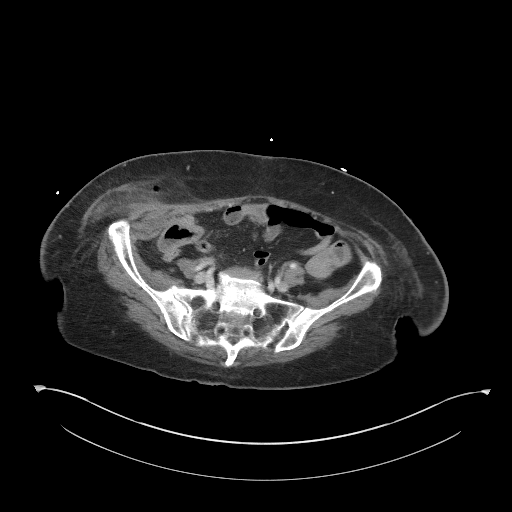
[im 47/100  soft-tissue]
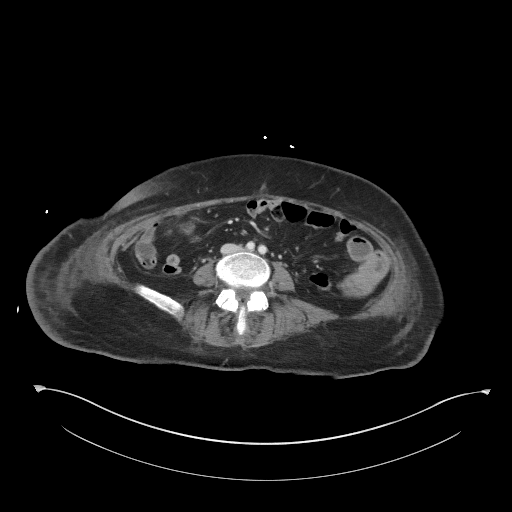
[im 53/100  soft-tissue]
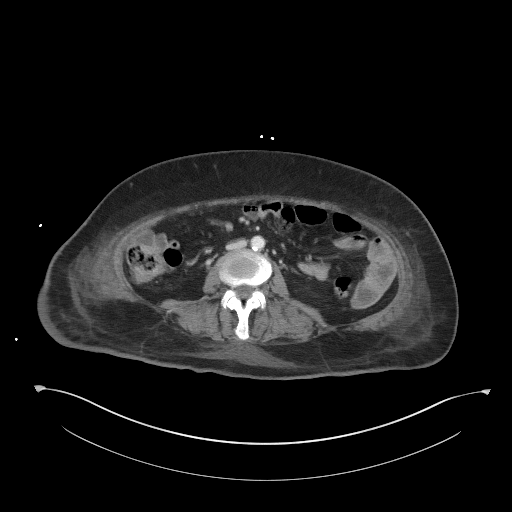
[im 63/100  soft-tissue]
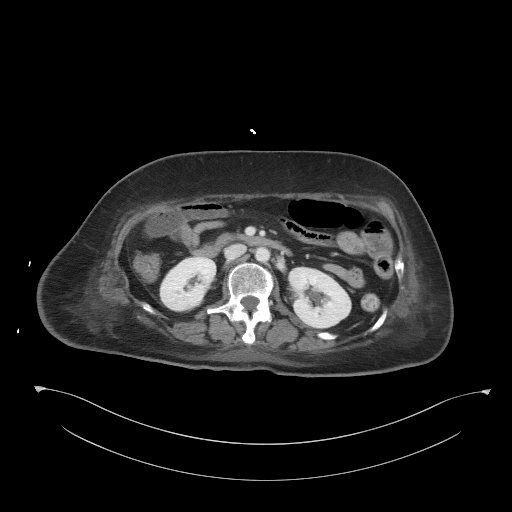
[im 68/100  soft-tissue]
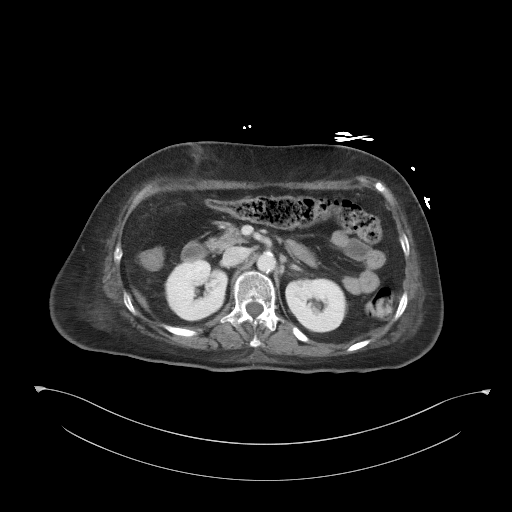
[im 68/100  bone]
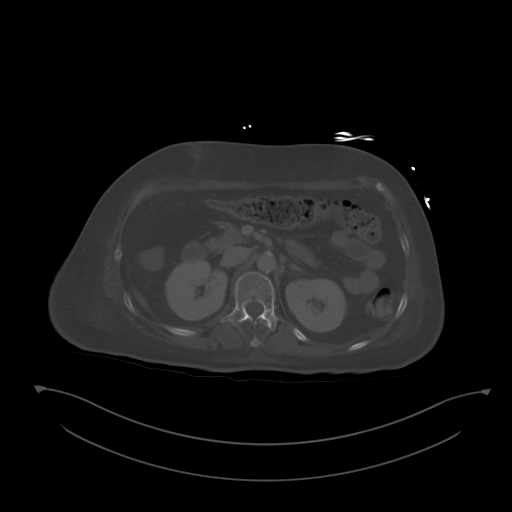
[im 79/100  soft-tissue]
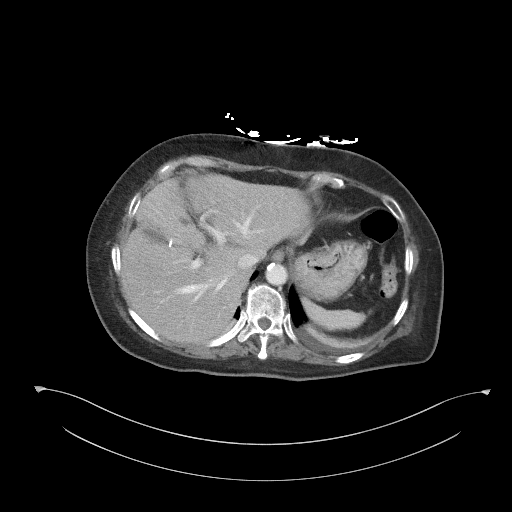
[im 84/100  soft-tissue]
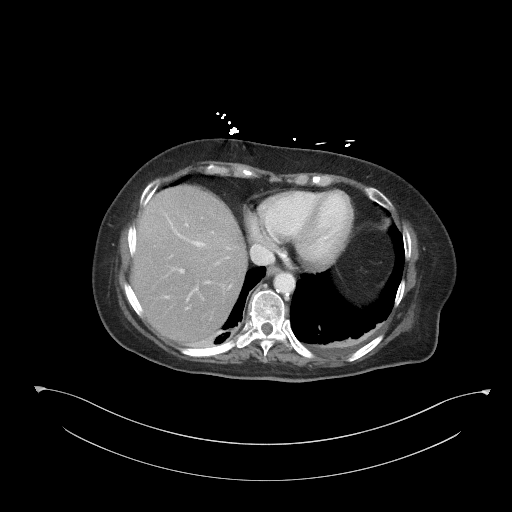
[im 94/100  soft-tissue]
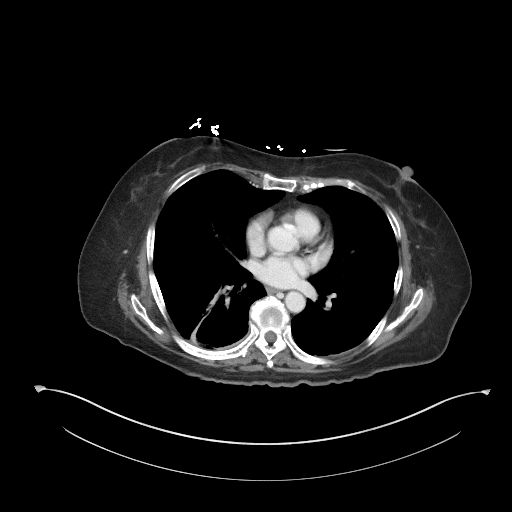

[Series 5: coronal st · coronal · 0.86mm/px · 3 of 94 slices shown]
[im 42/94  soft-tissue]
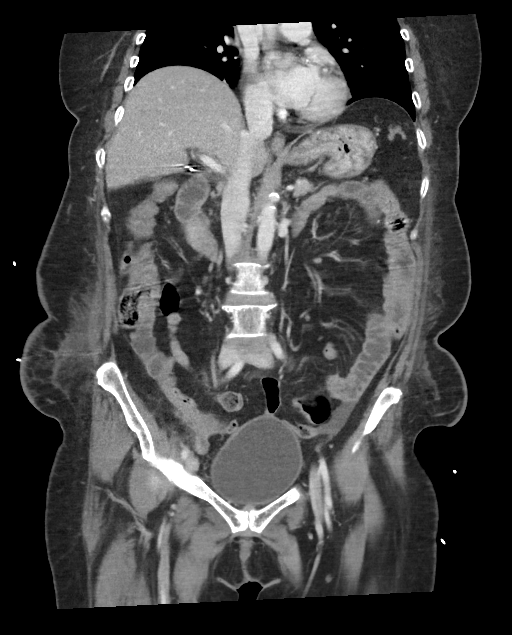
[im 52/94  soft-tissue]
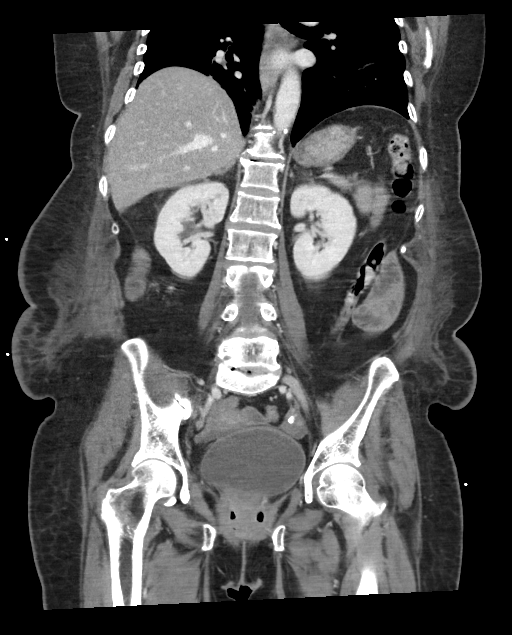
[im 63/94  soft-tissue]
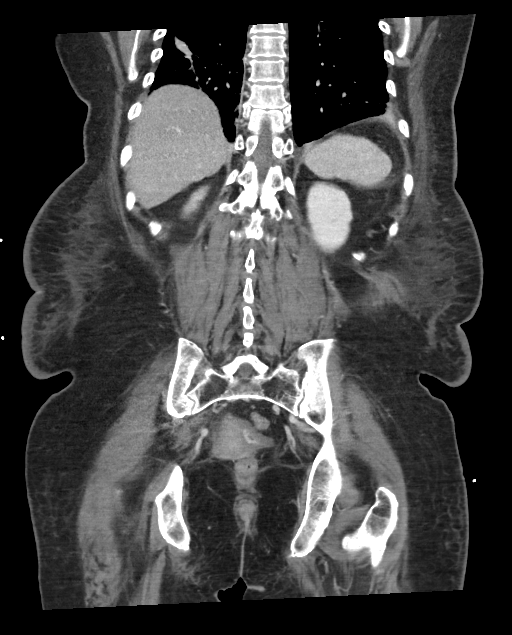

[15 of 46 positions shown; findings below may reference images not displayed]

FINDINGS: Lower chest: Trace left effusion. Mild dependent lower lobe
atelectasis.

Hepatobiliary: Liver normal size. Stable small low-density lesion
adjacent to the falciform ligament. No liver abnormalities. Status
post cholecystectomy. No bile duct dilation.

Pancreas: Unremarkable. No pancreatic ductal dilatation or
surrounding inflammatory changes.

Spleen: Normal in size without focal abnormality.

Adrenals/Urinary Tract: Adrenal glands are unremarkable. Kidneys are
normal, without renal calculi, focal lesion, or hydronephrosis.
Bladder is unremarkable.

Stomach/Bowel: New small bowel anastomosis in the right lower
quadrant. Adjacent small bowel loops show mild wall thickening.
There is a contiguous collection in the anterior, right pelvis,
measuring 6 x 3.1 x 5.8 cm. Remainder the small bowel is
unremarkable, normal in caliber with no other areas of wall
thickening. Colon is normal in caliber with no wall thickening or
adjacent inflammation.

Vascular/Lymphatic: Aortic atherosclerosis. No aneurysm. No enlarged
lymph nodes.

Reproductive: Uterus and bilateral adnexa are unremarkable.

Other: Fluid collection in the subcutaneous soft tissues of the
right lower quadrant, along the right lower quadrant incision.
Collection has ill-defined margins. It measures approximately, 8.5 x
4 x 5.2 cm. There is adjacent inflammatory stranding. This
collection may communicate with the adjacent collection anterior
right pelvic peritoneal cavity.

Musculoskeletal: No fracture or acute finding.  No bone lesion.
IMPRESSION: 1. Two adjacent postop collections, 1 in the subcutaneous soft
tissues of the right lower quadrant along the incision, 8.5 x 4 x
5.2 cm in size, the other below this, in the right anterior pelvis
peritoneal cavity measuring 6 x 3.1 x 5.8 cm. There associated
inflammatory changes mostly in right lower quadrant anterior
abdominal wall fat.
2. Small bowel anastomosis in the right lower quadrant adjacent to
the pelvic collection, associated with mild small bowel wall
thickening.
3. No evidence of bowel obstruction.  No free intraperitoneal air.

## 2022-07-11 IMAGING — CT CT ABD-PELV W/ CM
2 of 5 series · 16 of 46 positions shown, 18 images · IV contrast (Omnipaque or Isovue)
Comparison: 03/10/2021 and previous

CLINICAL DATA: Wound infection; previous cholecystectomy, hernia
repair, bowel resection

EXAM:
CT ABDOMEN AND PELVIS WITH CONTRAST
TECHNIQUE: Multidetector CT imaging of the abdomen and pelvis was performed
using the standard protocol following bolus administration of
intravenous contrast.
CONTRAST:  80mL OMNIPAQUE IOHEXOL 300 MG/ML  SOLN

[Series 2: axial st · axial · 0.80mm/px · z∈[+934,+1344]mm · 13 of 92 slices shown, 15 images]
[im 5/92  soft-tissue]
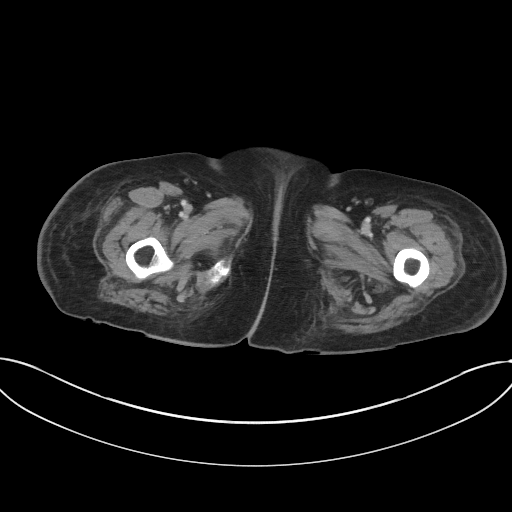
[im 5/92  bone]
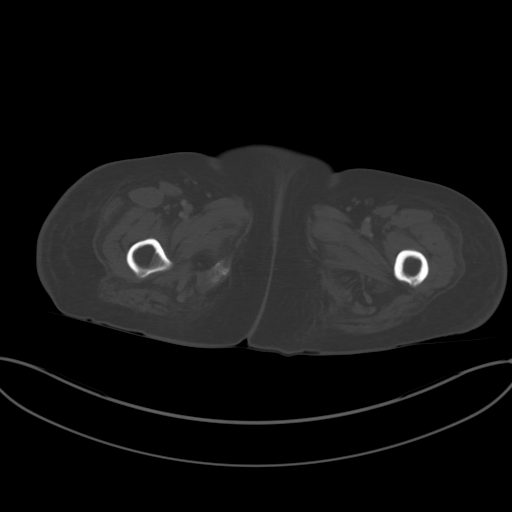
[im 15/92  soft-tissue]
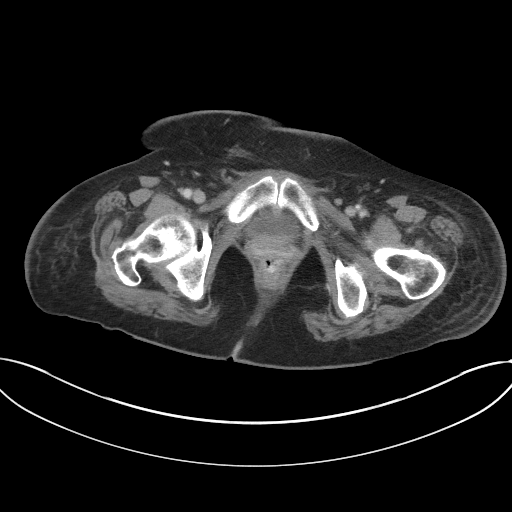
[im 20/92  soft-tissue]
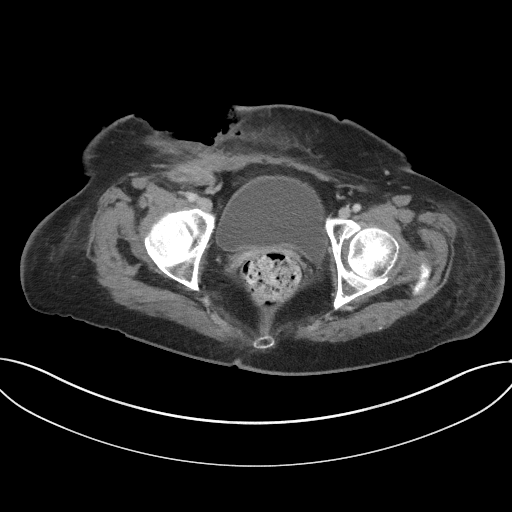
[im 24/92  soft-tissue]
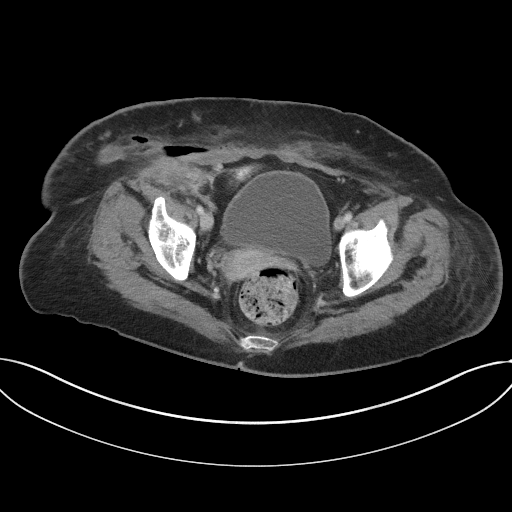
[im 34/92  soft-tissue]
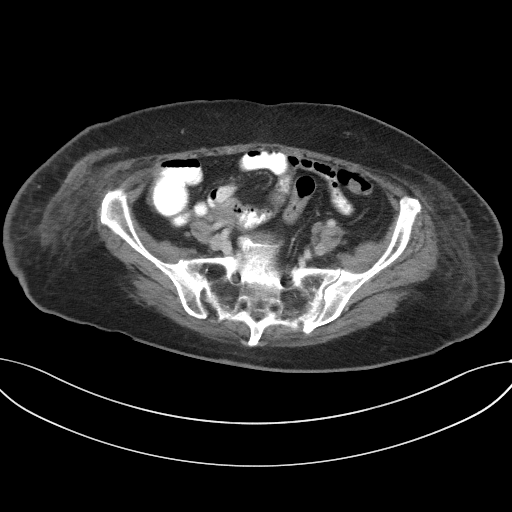
[im 39/92  soft-tissue]
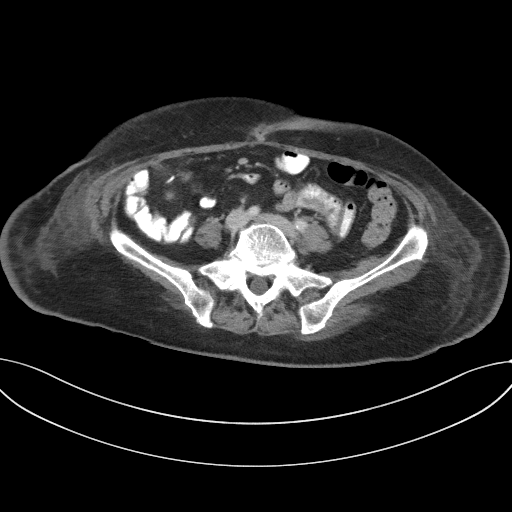
[im 48/92  soft-tissue]
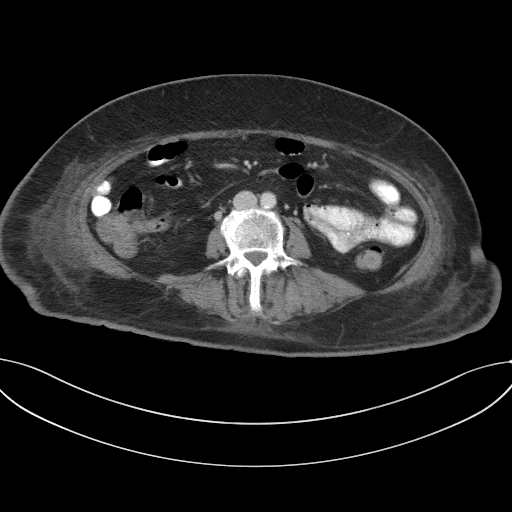
[im 53/92  soft-tissue]
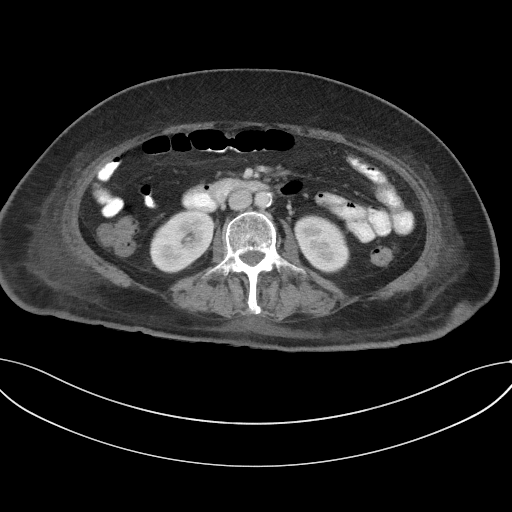
[im 58/92  soft-tissue]
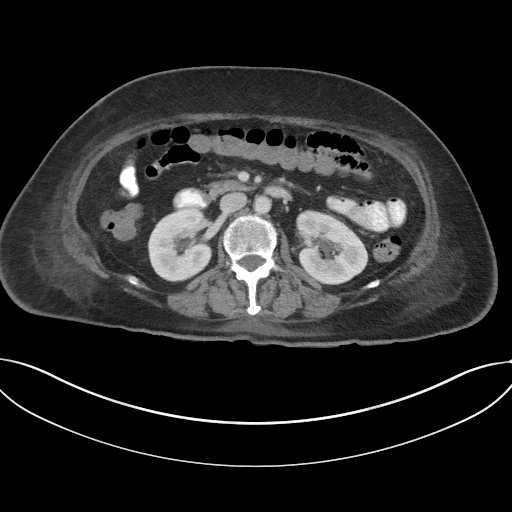
[im 58/92  bone]
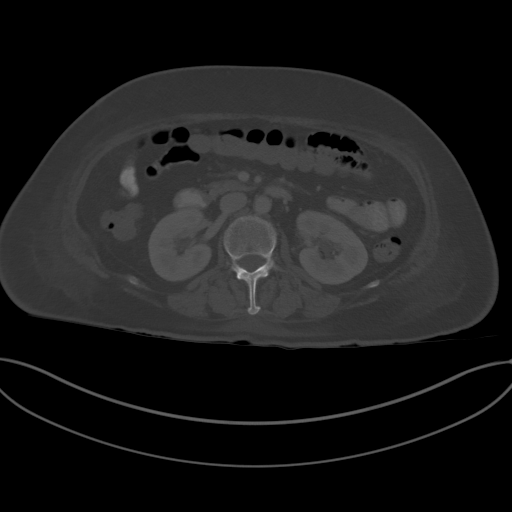
[im 68/92  soft-tissue]
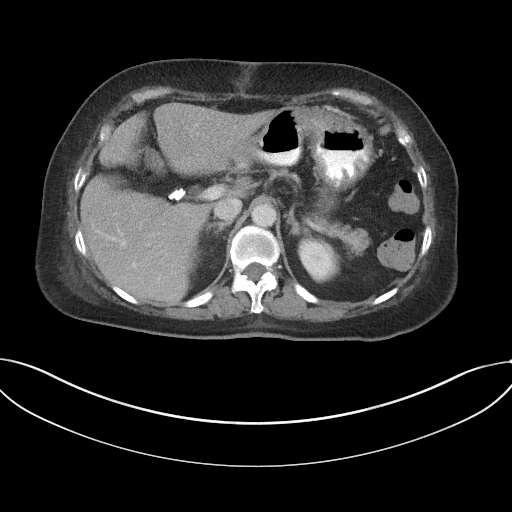
[im 72/92  soft-tissue]
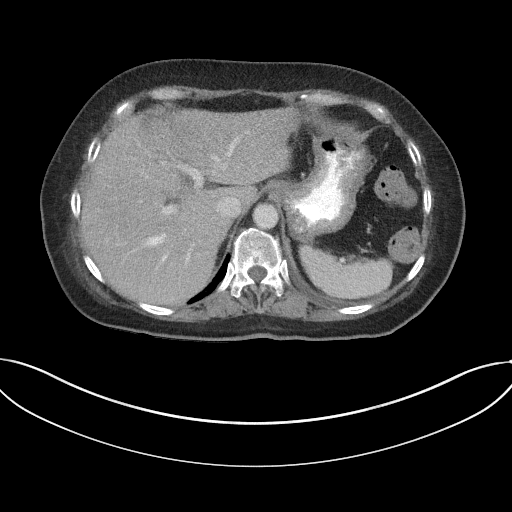
[im 77/92  soft-tissue]
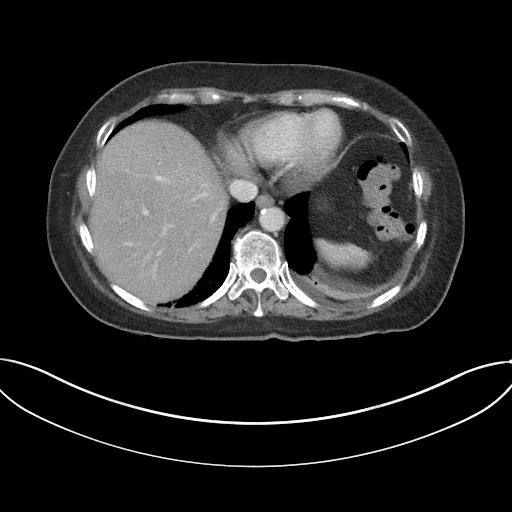
[im 87/92  soft-tissue]
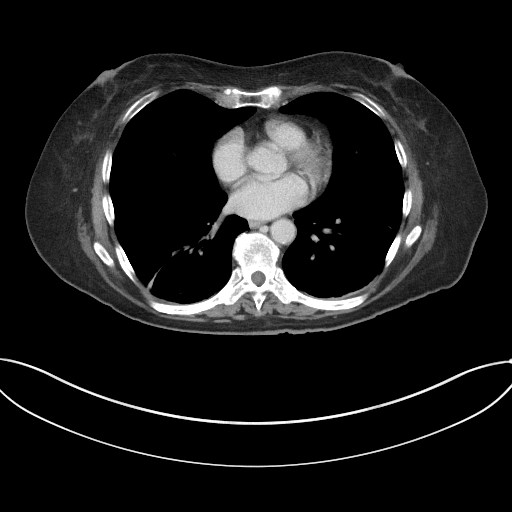

[Series 5: coronal st · coronal · 0.80mm/px · 3 of 93 slices shown]
[im 31/93  soft-tissue]
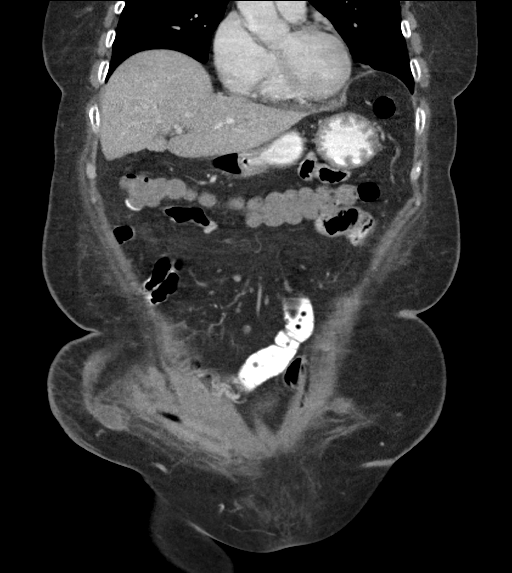
[im 41/93  soft-tissue]
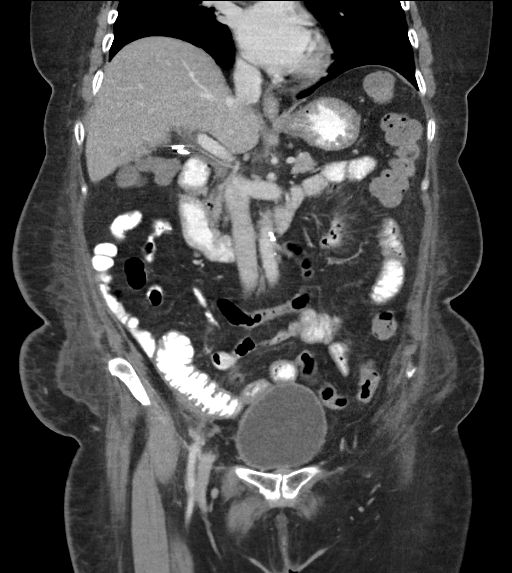
[im 52/93  soft-tissue]
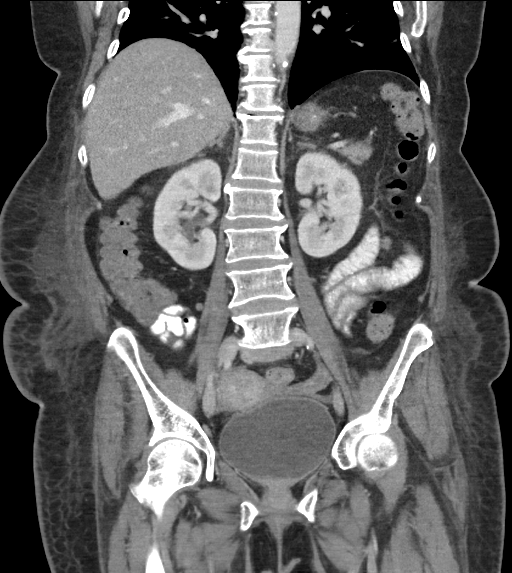

[16 of 46 positions shown; findings below may reference images not displayed]

FINDINGS: Lower chest: Trace left pleural effusion persists. Dependent
atelectasis posteriorly at the left lung base. Persistent linear
scarring or atelectasis in both lower lobes.

Hepatobiliary: No focal liver abnormality is seen. Status post
cholecystectomy. No biliary dilatation.

Pancreas: Unremarkable. No pancreatic ductal dilatation or
surrounding inflammatory changes.

Spleen: Normal in size without focal abnormality.

Adrenals/Urinary Tract: Adrenal glands are unremarkable. Kidneys are
normal, without renal calculi, focal lesion, or hydronephrosis.
Bladder is unremarkable.

Stomach/Bowel: Stomach is incompletely distended, unremarkable.
Small bowel decompressed. Anastomotic staple line in the right lower
quadrant. Incomplete distal passage of oral contrast material.
Terminal ileum unremarkable. Normal appendix. The colon is
nondilated, unremarkable.

Vascular/Lymphatic: Scattered aortoiliac calcified atheromatous
plaque without aneurysm or evident stenosis. Portal vein patent. No
abdominal or pelvic adenopathy localized.

Reproductive: Status post hysterectomy. No adnexal masses. Stable 7
mm coarse left ovarian calcification.

Other: No free air.  No ascites.

Musculoskeletal: Interval removal of skin staples overlying the
right inguinal region with resolution of the previously noted deep
subcutaneous fluid collection. Interval decrease in size of residual
loculated fluid overlying the right inguinal ligament, 2.4 x 1.1 cm
maximum transverse dimensions (previously 6 x 3.1). No new or
enlarging collections.

Degenerative disc disease L5-S1. No fracture or worrisome bone
lesion.
IMPRESSION: 1. Interval resolution of right lower pelvic subcutaneous fluid
collection, and residual 2.4 cm collection overlying the right
inguinal ligament (previously 6 cm).
2. No evidence of  obstruction.

## 2022-11-20 NOTE — Congregational Nurse Program (Signed)
  Dept: [phone]   Congregational Nurse Program Note  Date of Encounter: 11/20/2022  Past Medical History: Past Medical History:  Diagnosis Date   Acute cholecystitis 02/02/2021   GERD without esophagitis 11/27/2020   Obesity, unspecified 02/15/2015    Encounter Details:  CNP Questionnaire - 11/20/22 0849       Questionnaire   Ask client: Do you give verbal consent for me to treat you today? Yes    Student Assistance N/A    Location Patient Served  [facility]    Visit Setting with Client Organization    Patient Status Migrant    Insurance Uninsured ([place] Card/[facility]/Self-Pay/Medicaid Family Planning)    Insurance/Financial Assistance Referral [place] [employer]/[facility]    Medication Referred to Medication Assistance    Medical Provider No    Screening Referrals Made N/A    Medical Referrals Made [facility]    Medical Appointment Made Non-[facility] PCP[facility]clinic    Recently w/o PCP, now 1st time PCP visit completed due to CNs referral or appointment made N/A    Food Have Food Insecurities;Referred to Food Pantry    Transportation Need transportation assistance;Provided transportation assistance    Housing/Utilities N/A    Interpersonal Safety N/A    Interventions Advocate/Support;Navigate Healthcare System;Case Management;Educate    Abnormal to Normal Screening Since Last CN Visit N/A    Screenings CN Performed Blood Pressure    Sent Client to Lab for: N/A    Did client attend any of the following based off CNs referral or appointments made? 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 However, upon review and interview with myself and Libby Maw, client had not been seen in greater than a year and was last seen at [facility] August of 2023. She states she has not been seen anywhere since.  Reviewed last notes and vital signs  [facility].  She denies any complaints today during brief interaction during enrollment.  As noted in previous provider notes, blood pressure was noted to be somewhat elevated. Checked blood pressure during enrollment and was   Left Arm, sitting normal cuff, 150/82 pulse 69 She denies any headaches, she states just occasionally. She denies chest pains or shortness of breath.  PHQ 9 score today obtained by Maricarmen to assure client understood the questions and her score was 1 to having low energy.  SDOH needs: she reports food insecurity. She shopped food market today and was provided a Facilities manager to other pantries. She also reports difficulty with transportation and will be arranged transportation for her appt.  She wants to [facility] back to the [facility], client is 40 and will turn 37 in June 2025. With help of Maricarmen we explained that she will no longer be able to [facility] she is 64 and would need to change providers. She states understanding and wishes to proceed.  Appointment scheduled for 11/26/22 at 10 am and will scheduled [place] transportation for appointment. Will also plan follow up after visit.   Francee Nodal RN [employer]

## 2022-11-26 ENCOUNTER — Encounter: Payer: Self-pay | Admitting: Physician Assistant

## 2022-11-26 ENCOUNTER — Ambulatory Visit: Payer: Self-pay | Admitting: Physician Assistant

## 2022-11-26 VITALS — BP 142/79 | HR 72 | Temp 98.1°F | Ht 58.25 in | Wt 149.2 lb

## 2022-11-26 DIAGNOSIS — Z1239 Encounter for other screening for malignant neoplasm of breast: Secondary | ICD-10-CM

## 2022-11-26 DIAGNOSIS — Z8639 Personal history of other endocrine, nutritional and metabolic disease: Secondary | ICD-10-CM

## 2022-11-26 DIAGNOSIS — Z862 Personal history of diseases of the blood and blood-forming organs and certain disorders involving the immune mechanism: Secondary | ICD-10-CM

## 2022-11-26 DIAGNOSIS — R030 Elevated blood-pressure reading, without diagnosis of hypertension: Secondary | ICD-10-CM

## 2022-11-26 DIAGNOSIS — Z131 Encounter for screening for diabetes mellitus: Secondary | ICD-10-CM

## 2022-11-26 DIAGNOSIS — G8929 Other chronic pain: Secondary | ICD-10-CM

## 2022-11-26 DIAGNOSIS — Z7689 Persons encountering health services in other specified circumstances: Secondary | ICD-10-CM

## 2022-11-26 DIAGNOSIS — M545 Low back pain, unspecified: Secondary | ICD-10-CM

## 2022-11-26 DIAGNOSIS — Z789 Other specified health status: Secondary | ICD-10-CM

## 2022-11-26 DIAGNOSIS — M5136 Other intervertebral disc degeneration, lumbar region: Secondary | ICD-10-CM

## 2022-11-26 DIAGNOSIS — M51369 Other intervertebral disc degeneration, lumbar region without mention of lumbar back pain or lower extremity pain: Secondary | ICD-10-CM

## 2022-11-26 NOTE — Progress Notes (Signed)
BP (!) 142/79   Pulse 72   Temp 98.1 F (36.7 C)   Ht 4' 10.25" (1.48 m)   Wt 149 lb 4 oz (67.7 kg)   LMP 03/11/2008 (Approximate)   SpO2 97%   BMI 30.93 kg/m    Subjective:    Patient ID: Janice Wells, female    DOB: 12/06/1960, 62 y.o.   MRN: [id number]  HPI: Janice Wells is a 62 y.o. female presenting on 11/26/2022 for New Patient (Initial Visit) (Re-establishing care. Pt states besides her hip pain she is feeling well)   HPI   Chief Complaint  Patient presents with   New Patient (Initial Visit)    Re-establishing care. Pt states besides her hip pain she is feeling well    Pt last seen here 10/10/2021 for back pain; she had insurance at that time so was no longer eligible for [facility].    She was a no-show to her f/u with GI in October.   She is still in need of colonoscopy per GI- the one she had in June 2023 was not good prep.    She C/o hip and back pain.    It Hurts when she bends over to pick something up or if she stands for a long time.  Just stiing here her back only hurts.    Sometimes it hurts to walk.  No numbness or weakness.   She had DDD on CT January 2023  She says she feels well other than her LBP.    Relevant past medical, surgical, family and social history reviewed and updated as indicated. Interim medical history since our last visit reviewed. Allergies and medications reviewed and updated.  No current outpatient medications on file.    Review of Systems  Per HPI unless specifically indicated above     Objective:    BP (!) 142/79   Pulse 72   Temp 98.1 F (36.7 C)   Ht 4' 10.25" (1.48 m)   Wt 149 lb 4 oz (67.7 kg)   LMP 03/11/2008 (Approximate)   SpO2 97%   BMI 30.93 kg/m   Wt Readings from Last 3 Encounters:  11/26/22 149 lb 4 oz (67.7 kg)  10/10/21 144 lb (65.3 kg)  08/28/21 139 lb (63 kg)    Physical Exam Vitals reviewed.  Constitutional:      General: She is not in acute distress.    Appearance: She  is well-developed. She is not toxic-appearing.  HENT:     Head: Normocephalic and atraumatic.     Right Ear: Tympanic membrane, ear canal and external ear normal.     Left Ear: Tympanic membrane, ear canal and external ear normal.     Mouth/Throat:     Mouth: Oropharynx is clear and moist.     Pharynx: No oropharyngeal exudate.  Eyes:     Extraocular Movements: EOM normal.     Conjunctiva/sclera: Conjunctivae normal.     Pupils: Pupils are equal, round, and reactive to light.  Neck:     Thyroid: No thyromegaly.  Cardiovascular:     Rate and Rhythm: Normal rate and regular rhythm.     Pulses:          Dorsalis pedis pulses are 2+ on the right side and 2+ on the left side.  Pulmonary:     Effort: Pulmonary effort is normal.     Breath sounds: Normal breath sounds.  Abdominal:     General: Bowel sounds are normal.  Palpations: Abdomen is soft. There is no mass.     Tenderness: There is no abdominal tenderness.  Musculoskeletal:        General: No edema.     Cervical back: Neck supple.     Thoracic back: No tenderness.     Lumbar back: Tenderness present. No swelling or bony tenderness. Negative right straight leg raise test and negative left straight leg raise test.     Right hip: No tenderness. Normal range of motion.     Left hip: No tenderness. Normal range of motion.     Right lower leg: No edema.     Left lower leg: No edema.     Comments: Generalized lumbar tenderness without bony point tenderness  Lymphadenopathy:     Cervical: No cervical adenopathy.  Skin:    General: Skin is warm and dry.  Neurological:     Mental Status: She is alert and oriented to person, place, and time.     Gait: Gait normal.     Deep Tendon Reflexes:     Reflex Scores:      Patellar reflexes are 2+ on the right side and 2+ on the left side. Psychiatric:        Attention and Perception: Attention normal.        Mood and Affect: Mood and affect normal.        Behavior: Behavior normal.  Behavior is cooperative.            Assessment & Plan:   Encounter Diagnoses  Name Primary?   Encounter to establish care Yes   Elevated blood pressure reading without diagnosis of hypertension    Not proficient in English language    History of hyperlipidemia    History of elevated glucose    Screening for diabetes mellitus    History of anemia    Encounter for screening for malignant neoplasm of breast, unspecified screening modality    Chronic midline low back pain without sciatica    DDD (degenerative disc disease), lumbar      HCM -will refer for screening Mammogram -Pap due oct 2025 per last PAP done here in 2020 but epic shows she had PAP done at [facility] clinic in December.  Record request sent for results  LBP - will check xray back as concern for compression fracture  Uninsured -pt is given application for [facility] financial assistance  Elevated bp -will recheck bp 3 weeks  Will update labs.   Pt will need to Needs colonoscopy/return to GI  Pt to follow up 3 weeks.  She is to RTO sooner prn

## 2022-12-03 ENCOUNTER — Telehealth: Payer: Self-pay

## 2022-12-03 NOTE — Telephone Encounter (Signed)
Attempted follow up call after [facility]-establ[facility] with [facility] on 11/26/22.  Interpreter services used. No answer, was not able to leave message today.  Updated care coordination note that client approved for [facility] until 11/10/23.   Plan: will attempt follow up again by phone as reminder of next appointment and x-ray and labs that are needed to be done at [facility].   Francee Nodal RN [employer]

## 2022-12-10 ENCOUNTER — Encounter: Payer: Self-pay | Admitting: Physician Assistant

## 2022-12-10 NOTE — Progress Notes (Signed)
Records for most recent PAP sent to [facility] clinic per pt report of having that done.  Response from that facility state pt had no PAP at [facility] clinic.

## 2022-12-17 ENCOUNTER — Ambulatory Visit: Payer: Self-pay | Admitting: Physician Assistant

## 2022-12-17 ENCOUNTER — Other Ambulatory Visit (HOSPITAL_COMMUNITY)
Admission: RE | Admit: 2022-12-17 | Discharge: 2022-12-17 | Disposition: A | Discharge status: Home / Self Care | Payer: Self-pay | Source: Ambulatory Visit | Attending: Physician Assistant | Admitting: Physician Assistant

## 2022-12-17 DIAGNOSIS — Z8639 Personal history of other endocrine, nutritional and metabolic disease: Secondary | ICD-10-CM | POA: Insufficient documentation

## 2022-12-17 DIAGNOSIS — R030 Elevated blood-pressure reading, without diagnosis of hypertension: Secondary | ICD-10-CM | POA: Insufficient documentation

## 2022-12-17 DIAGNOSIS — Z131 Encounter for screening for diabetes mellitus: Secondary | ICD-10-CM | POA: Insufficient documentation

## 2022-12-17 DIAGNOSIS — Z862 Personal history of diseases of the blood and blood-forming organs and certain disorders involving the immune mechanism: Secondary | ICD-10-CM | POA: Insufficient documentation

## 2022-12-17 LAB — HEMOGLOBIN A1C
Hgb A1c MFr Bld: 6.1 % — ABNORMAL HIGH (ref 4.8–5.6)
Mean Plasma Glucose: 128.37 mg/dL

## 2022-12-17 LAB — COMPREHENSIVE METABOLIC PANEL
ALT: 18 U/L (ref 0–44)
AST: 21 U/L (ref 15–41)
Albumin: 4 g/dL (ref 3.5–5.0)
Alkaline Phosphatase: 105 U/L (ref 38–126)
Anion gap: 10 (ref 5–15)
BUN: 11 mg/dL (ref 8–23)
CO2: 22 mmol/L (ref 22–32)
Calcium: 8.7 mg/dL — ABNORMAL LOW (ref 8.9–10.3)
Chloride: 107 mmol/L (ref 98–111)
Creatinine, Ser: 0.66 mg/dL (ref 0.44–1.00)
GFR, Estimated: >60 mL/min (ref >60)
Glucose, Bld: 106 mg/dL — ABNORMAL HIGH (ref 70–99)
Potassium: 4.1 mmol/L (ref 3.5–5.1)
Sodium: 139 mmol/L (ref 135–145)
Total Bilirubin: 0.5 mg/dL (ref 0.3–1.2)
Total Protein: 8.4 g/dL — ABNORMAL HIGH (ref 6.5–8.1)

## 2022-12-17 LAB — CBC
HCT: 40.9 % (ref 36.0–46.0)
Hemoglobin: 13.4 g/dL (ref 12.0–15.0)
MCH: 29.3 pg (ref 26.0–34.0)
MCHC: 32.8 g/dL (ref 30.0–36.0)
MCV: 89.3 fL (ref 80.0–100.0)
Platelets: 220 10*3/uL (ref 150–400)
RBC: 4.58 MIL/uL (ref 3.87–5.11)
RDW: 13.2 % (ref 11.5–15.5)
WBC: 5.4 10*3/uL (ref 4.0–10.5)
nRBC: 0 % (ref 0.0–0.2)

## 2022-12-17 LAB — LIPID PANEL
Cholesterol: 180 mg/dL (ref 0–200)
HDL: 41 mg/dL (ref >40)
LDL Cholesterol: 118 mg/dL — ABNORMAL HIGH (ref 0–99)
Total CHOL/HDL Ratio: 4.4 {ratio}
Triglycerides: 103 mg/dL (ref <150)
VLDL: 21 mg/dL (ref 0–40)

## 2022-12-26 ENCOUNTER — Other Ambulatory Visit: Payer: Self-pay | Admitting: Physician Assistant

## 2022-12-26 DIAGNOSIS — Z1231 Encounter for screening mammogram for malignant neoplasm of breast: Secondary | ICD-10-CM

## 2023-01-24 ENCOUNTER — Ambulatory Visit (HOSPITAL_COMMUNITY)
Admission: RE | Admit: 2023-01-24 | Discharge: 2023-01-24 | Disposition: A | Discharge status: Home / Self Care | Payer: Self-pay | Source: Ambulatory Visit | Attending: Physician Assistant | Admitting: Physician Assistant

## 2023-01-24 DIAGNOSIS — Z1231 Encounter for screening mammogram for malignant neoplasm of breast: Secondary | ICD-10-CM | POA: Insufficient documentation

## 2023-04-02 ENCOUNTER — Telehealth: Payer: Self-pay

## 2023-04-02 NOTE — Telephone Encounter (Signed)
Attempted follow up with [facility] client, interpreter services utilized. No answer, unable to leave message.  Checked both [facility] has not been seen there since [facility]/17/22 [facility] whom client wanted to re-establish with on enrollment was last seen 11/26/22 and was to follow up in 3 weeks however missed appointment. Not showing a future appointment at this time.  Will plan to continue to attempt to reach client  Francee Nodal RN [employer]/[facility]
